# Patient Record
Sex: Female | Born: 1935 | Race: Black or African American | Hispanic: No | Smoking: Never smoker
Health system: Southern US, Community
[De-identification: ages and names within clinical notes are randomized; demographics above are authoritative.]

## PROBLEM LIST (undated history)

## (undated) DIAGNOSIS — M1990 Unspecified osteoarthritis, unspecified site: Secondary | ICD-10-CM

## (undated) DIAGNOSIS — I10 Essential (primary) hypertension: Secondary | ICD-10-CM

## (undated) DIAGNOSIS — C801 Malignant (primary) neoplasm, unspecified: Secondary | ICD-10-CM

## (undated) DIAGNOSIS — IMO0001 Reserved for inherently not codable concepts without codable children: Secondary | ICD-10-CM

## (undated) DIAGNOSIS — E785 Hyperlipidemia, unspecified: Secondary | ICD-10-CM

## (undated) DIAGNOSIS — I639 Cerebral infarction, unspecified: Secondary | ICD-10-CM

## (undated) HISTORY — DX: Essential (primary) hypertension: I10

## (undated) HISTORY — DX: Reserved for inherently not codable concepts without codable children: IMO0001

## (undated) HISTORY — DX: Unspecified osteoarthritis, unspecified site: M19.90

## (undated) HISTORY — DX: Cerebral infarction, unspecified: I63.9

## (undated) HISTORY — DX: Hyperlipidemia, unspecified: E78.5

## (undated) SURGERY — EGD (ESOPHAGOGASTRODUODENOSCOPY)
Anesthesia: Moderate Sedation

---

## 1998-10-03 ENCOUNTER — Encounter: Admission: RE | Admit: 1998-10-03 | Discharge: 1998-10-03 | Payer: Self-pay | Admitting: Family Medicine

## 1998-10-06 ENCOUNTER — Encounter: Admission: RE | Admit: 1998-10-06 | Discharge: 1998-10-06 | Payer: Self-pay | Admitting: Sports Medicine

## 1999-01-21 ENCOUNTER — Ambulatory Visit (HOSPITAL_COMMUNITY): Admission: RE | Admit: 1999-01-21 | Discharge: 1999-01-21 | Payer: Self-pay | Admitting: Gastroenterology

## 1999-06-29 ENCOUNTER — Encounter: Admission: RE | Admit: 1999-06-29 | Discharge: 1999-06-29 | Payer: Self-pay | Admitting: Family Medicine

## 1999-07-14 ENCOUNTER — Ambulatory Visit (HOSPITAL_COMMUNITY): Admission: RE | Admit: 1999-07-14 | Discharge: 1999-07-14 | Payer: Self-pay | Admitting: Gastroenterology

## 2000-07-11 ENCOUNTER — Encounter: Payer: Self-pay | Admitting: Sports Medicine

## 2000-07-11 ENCOUNTER — Encounter: Admission: RE | Admit: 2000-07-11 | Discharge: 2000-07-11 | Payer: Self-pay | Admitting: Sports Medicine

## 2000-08-12 ENCOUNTER — Encounter: Admission: RE | Admit: 2000-08-12 | Discharge: 2000-08-12 | Payer: Self-pay | Admitting: Family Medicine

## 2000-10-06 ENCOUNTER — Ambulatory Visit (HOSPITAL_COMMUNITY): Admission: RE | Admit: 2000-10-06 | Discharge: 2000-10-06 | Payer: Self-pay | Admitting: Gastroenterology

## 2000-10-06 ENCOUNTER — Encounter (INDEPENDENT_AMBULATORY_CARE_PROVIDER_SITE_OTHER): Payer: Self-pay | Admitting: Specialist

## 2001-07-28 ENCOUNTER — Encounter: Admission: RE | Admit: 2001-07-28 | Discharge: 2001-07-28 | Payer: Self-pay | Admitting: *Deleted

## 2001-08-07 ENCOUNTER — Encounter: Admission: RE | Admit: 2001-08-07 | Discharge: 2001-08-07 | Payer: Self-pay | Admitting: Family Medicine

## 2001-08-22 ENCOUNTER — Encounter: Admission: RE | Admit: 2001-08-22 | Discharge: 2001-08-22 | Payer: Self-pay | Admitting: Family Medicine

## 2001-08-22 ENCOUNTER — Ambulatory Visit (HOSPITAL_COMMUNITY): Admission: RE | Admit: 2001-08-22 | Discharge: 2001-08-22 | Payer: Self-pay | Admitting: Sports Medicine

## 2001-08-23 ENCOUNTER — Encounter: Admission: RE | Admit: 2001-08-23 | Discharge: 2001-08-23 | Payer: Self-pay | Admitting: *Deleted

## 2001-08-23 ENCOUNTER — Encounter: Payer: Self-pay | Admitting: *Deleted

## 2001-09-08 ENCOUNTER — Ambulatory Visit (HOSPITAL_COMMUNITY): Admission: RE | Admit: 2001-09-08 | Discharge: 2001-09-08 | Payer: Self-pay | Admitting: *Deleted

## 2001-09-18 ENCOUNTER — Encounter: Admission: RE | Admit: 2001-09-18 | Discharge: 2001-09-18 | Payer: Self-pay | Admitting: Sports Medicine

## 2002-03-21 ENCOUNTER — Encounter (INDEPENDENT_AMBULATORY_CARE_PROVIDER_SITE_OTHER): Payer: Self-pay | Admitting: Specialist

## 2002-03-21 ENCOUNTER — Ambulatory Visit (HOSPITAL_COMMUNITY): Admission: RE | Admit: 2002-03-21 | Discharge: 2002-03-21 | Payer: Self-pay | Admitting: Gastroenterology

## 2002-08-06 ENCOUNTER — Encounter: Payer: Self-pay | Admitting: Sports Medicine

## 2002-08-06 ENCOUNTER — Encounter: Admission: RE | Admit: 2002-08-06 | Discharge: 2002-08-06 | Payer: Self-pay | Admitting: Sports Medicine

## 2002-08-22 ENCOUNTER — Encounter: Admission: RE | Admit: 2002-08-22 | Discharge: 2002-08-22 | Payer: Self-pay | Admitting: Sports Medicine

## 2003-01-31 ENCOUNTER — Encounter: Admission: RE | Admit: 2003-01-31 | Discharge: 2003-01-31 | Payer: Self-pay | Admitting: Family Medicine

## 2003-02-04 ENCOUNTER — Encounter: Admission: RE | Admit: 2003-02-04 | Discharge: 2003-02-04 | Payer: Self-pay | Admitting: Family Medicine

## 2003-02-07 ENCOUNTER — Encounter: Admission: RE | Admit: 2003-02-07 | Discharge: 2003-02-07 | Payer: Self-pay | Admitting: Family Medicine

## 2003-02-13 ENCOUNTER — Encounter: Admission: RE | Admit: 2003-02-13 | Discharge: 2003-02-13 | Payer: Self-pay | Admitting: Family Medicine

## 2003-03-05 ENCOUNTER — Encounter: Admission: RE | Admit: 2003-03-05 | Discharge: 2003-03-05 | Payer: Self-pay | Admitting: Family Medicine

## 2003-04-04 ENCOUNTER — Encounter: Admission: RE | Admit: 2003-04-04 | Discharge: 2003-04-04 | Payer: Self-pay | Admitting: Sports Medicine

## 2003-04-23 ENCOUNTER — Encounter: Admission: RE | Admit: 2003-04-23 | Discharge: 2003-04-23 | Payer: Self-pay | Admitting: Family Medicine

## 2003-09-04 ENCOUNTER — Encounter: Admission: RE | Admit: 2003-09-04 | Discharge: 2003-09-04 | Payer: Self-pay | Admitting: Sports Medicine

## 2003-09-10 ENCOUNTER — Encounter (INDEPENDENT_AMBULATORY_CARE_PROVIDER_SITE_OTHER): Payer: Self-pay | Admitting: *Deleted

## 2003-09-10 ENCOUNTER — Ambulatory Visit (HOSPITAL_COMMUNITY): Admission: RE | Admit: 2003-09-10 | Discharge: 2003-09-10 | Payer: Self-pay | Admitting: Gastroenterology

## 2003-10-02 ENCOUNTER — Encounter (INDEPENDENT_AMBULATORY_CARE_PROVIDER_SITE_OTHER): Payer: Self-pay | Admitting: *Deleted

## 2003-10-02 ENCOUNTER — Encounter: Admission: RE | Admit: 2003-10-02 | Discharge: 2003-10-02 | Payer: Self-pay | Admitting: Sports Medicine

## 2003-10-02 ENCOUNTER — Encounter: Admission: RE | Admit: 2003-10-02 | Discharge: 2003-10-02 | Payer: Self-pay | Admitting: Family Medicine

## 2003-10-02 LAB — CONVERTED CEMR LAB

## 2003-10-08 ENCOUNTER — Encounter: Admission: RE | Admit: 2003-10-08 | Discharge: 2003-10-08 | Payer: Self-pay | Admitting: Family Medicine

## 2003-10-21 ENCOUNTER — Encounter: Admission: RE | Admit: 2003-10-21 | Discharge: 2003-10-21 | Payer: Self-pay | Admitting: Family Medicine

## 2004-03-13 ENCOUNTER — Encounter: Admission: RE | Admit: 2004-03-13 | Discharge: 2004-03-13 | Payer: Self-pay | Admitting: Family Medicine

## 2004-04-13 ENCOUNTER — Encounter: Admission: RE | Admit: 2004-04-13 | Discharge: 2004-04-13 | Payer: Self-pay | Admitting: Family Medicine

## 2004-04-14 ENCOUNTER — Encounter: Admission: RE | Admit: 2004-04-14 | Discharge: 2004-04-14 | Payer: Self-pay | Admitting: Family Medicine

## 2004-10-06 ENCOUNTER — Encounter: Admission: RE | Admit: 2004-10-06 | Discharge: 2004-10-06 | Payer: Self-pay | Admitting: Sports Medicine

## 2005-04-22 ENCOUNTER — Ambulatory Visit: Payer: Self-pay | Admitting: Family Medicine

## 2005-05-10 ENCOUNTER — Emergency Department (HOSPITAL_COMMUNITY): Admission: EM | Admit: 2005-05-10 | Discharge: 2005-05-10 | Payer: Self-pay | Admitting: Family Medicine

## 2005-05-12 ENCOUNTER — Ambulatory Visit (HOSPITAL_COMMUNITY): Admission: RE | Admit: 2005-05-12 | Discharge: 2005-05-12 | Payer: Self-pay | Admitting: Orthopaedic Surgery

## 2005-06-01 ENCOUNTER — Encounter: Admission: RE | Admit: 2005-06-01 | Discharge: 2005-08-30 | Payer: Self-pay | Admitting: Orthopaedic Surgery

## 2005-09-07 ENCOUNTER — Ambulatory Visit: Payer: Self-pay | Admitting: Family Medicine

## 2005-10-14 ENCOUNTER — Encounter: Admission: RE | Admit: 2005-10-14 | Discharge: 2005-10-14 | Payer: Self-pay | Admitting: Sports Medicine

## 2005-12-31 ENCOUNTER — Ambulatory Visit: Payer: Self-pay | Admitting: Sports Medicine

## 2006-10-17 ENCOUNTER — Encounter: Admission: RE | Admit: 2006-10-17 | Discharge: 2006-10-17 | Payer: Self-pay | Admitting: Sports Medicine

## 2006-12-12 ENCOUNTER — Emergency Department (HOSPITAL_COMMUNITY): Admission: EM | Admit: 2006-12-12 | Discharge: 2006-12-12 | Payer: Self-pay | Admitting: Family Medicine

## 2006-12-29 DIAGNOSIS — E7800 Pure hypercholesterolemia, unspecified: Secondary | ICD-10-CM | POA: Insufficient documentation

## 2006-12-29 DIAGNOSIS — D126 Benign neoplasm of colon, unspecified: Secondary | ICD-10-CM | POA: Insufficient documentation

## 2006-12-29 DIAGNOSIS — E669 Obesity, unspecified: Secondary | ICD-10-CM | POA: Insufficient documentation

## 2006-12-29 DIAGNOSIS — I6789 Other cerebrovascular disease: Secondary | ICD-10-CM | POA: Insufficient documentation

## 2006-12-29 DIAGNOSIS — I10 Essential (primary) hypertension: Secondary | ICD-10-CM | POA: Insufficient documentation

## 2006-12-30 ENCOUNTER — Encounter (INDEPENDENT_AMBULATORY_CARE_PROVIDER_SITE_OTHER): Payer: Self-pay | Admitting: *Deleted

## 2007-03-09 ENCOUNTER — Ambulatory Visit: Payer: Self-pay | Admitting: Family Medicine

## 2007-03-09 ENCOUNTER — Encounter: Payer: Self-pay | Admitting: Family Medicine

## 2007-03-09 DIAGNOSIS — M479 Spondylosis, unspecified: Secondary | ICD-10-CM | POA: Insufficient documentation

## 2007-03-10 LAB — CONVERTED CEMR LAB
ALT: 13 units/L (ref 0–35)
AST: 16 units/L (ref 0–37)
Albumin: 3.8 g/dL (ref 3.5–5.2)
Alkaline Phosphatase: 88 units/L (ref 39–117)
BUN: 15 mg/dL (ref 6–23)
CO2: 28 meq/L (ref 19–32)
Calcium: 9.2 mg/dL (ref 8.4–10.5)
Chloride: 103 meq/L (ref 96–112)
Cholesterol: 259 mg/dL — ABNORMAL HIGH (ref 0–200)
Creatinine, Ser: 0.75 mg/dL (ref 0.40–1.20)
Glucose, Bld: 96 mg/dL (ref 70–99)
HDL: 48 mg/dL (ref >39)
LDL Cholesterol: 195 mg/dL — ABNORMAL HIGH (ref 0–99)
Potassium: 3.9 meq/L (ref 3.5–5.3)
Sodium: 141 meq/L (ref 135–145)
Total Bilirubin: 0.4 mg/dL (ref 0.3–1.2)
Total CHOL/HDL Ratio: 5.4
Total Protein: 7.1 g/dL (ref 6.0–8.3)
Triglycerides: 79 mg/dL (ref <150)
VLDL: 16 mg/dL (ref 0–40)

## 2007-03-30 ENCOUNTER — Encounter: Payer: Self-pay | Admitting: Family Medicine

## 2007-04-26 ENCOUNTER — Telehealth: Payer: Self-pay | Admitting: *Deleted

## 2007-08-08 ENCOUNTER — Telehealth: Payer: Self-pay | Admitting: *Deleted

## 2007-10-20 ENCOUNTER — Encounter: Admission: RE | Admit: 2007-10-20 | Discharge: 2007-10-20 | Payer: Self-pay | Admitting: Sports Medicine

## 2008-01-05 ENCOUNTER — Ambulatory Visit: Payer: Self-pay | Admitting: Family Medicine

## 2008-01-05 DIAGNOSIS — M19049 Primary osteoarthritis, unspecified hand: Secondary | ICD-10-CM | POA: Insufficient documentation

## 2008-01-19 ENCOUNTER — Emergency Department (HOSPITAL_COMMUNITY): Admission: EM | Admit: 2008-01-19 | Discharge: 2008-01-19 | Payer: Self-pay | Admitting: Emergency Medicine

## 2008-02-23 ENCOUNTER — Encounter (INDEPENDENT_AMBULATORY_CARE_PROVIDER_SITE_OTHER): Payer: Self-pay | Admitting: Family Medicine

## 2008-02-23 ENCOUNTER — Ambulatory Visit: Payer: Self-pay | Admitting: Family Medicine

## 2008-02-23 LAB — CONVERTED CEMR LAB
Bilirubin Urine: NEGATIVE
Blood in Urine, dipstick: NEGATIVE
Glucose, Urine, Semiquant: NEGATIVE
Ketones, urine, test strip: NEGATIVE
Nitrite: NEGATIVE
Specific Gravity, Urine: 1.02
Urobilinogen, UA: 0.2
pH: 6

## 2008-03-28 ENCOUNTER — Telehealth: Payer: Self-pay | Admitting: *Deleted

## 2008-04-23 ENCOUNTER — Encounter: Payer: Self-pay | Admitting: Family Medicine

## 2008-04-23 ENCOUNTER — Ambulatory Visit: Payer: Self-pay | Admitting: Family Medicine

## 2008-04-23 DIAGNOSIS — M1990 Unspecified osteoarthritis, unspecified site: Secondary | ICD-10-CM | POA: Insufficient documentation

## 2008-04-23 LAB — CONVERTED CEMR LAB
ALT: 10 units/L (ref 0–35)
AST: 14 units/L (ref 0–37)
Albumin: 4 g/dL (ref 3.5–5.2)
Alkaline Phosphatase: 87 units/L (ref 39–117)
BUN: 16 mg/dL (ref 6–23)
CO2: 27 meq/L (ref 19–32)
Calcium: 9.3 mg/dL (ref 8.4–10.5)
Chloride: 104 meq/L (ref 96–112)
Cholesterol: 260 mg/dL — ABNORMAL HIGH (ref 0–200)
Creatinine, Ser: 0.72 mg/dL (ref 0.40–1.20)
Glucose, Bld: 104 mg/dL — ABNORMAL HIGH (ref 70–99)
HDL: 43 mg/dL (ref >39)
LDL Cholesterol: 198 mg/dL — ABNORMAL HIGH (ref 0–99)
Potassium: 3.6 meq/L (ref 3.5–5.3)
Sodium: 140 meq/L (ref 135–145)
Total Bilirubin: 0.7 mg/dL (ref 0.3–1.2)
Total CHOL/HDL Ratio: 6
Total Protein: 7 g/dL (ref 6.0–8.3)
Triglycerides: 95 mg/dL (ref <150)
VLDL: 19 mg/dL (ref 0–40)

## 2008-05-14 ENCOUNTER — Emergency Department (HOSPITAL_COMMUNITY): Admission: EM | Admit: 2008-05-14 | Discharge: 2008-05-14 | Payer: Self-pay | Admitting: Emergency Medicine

## 2008-05-15 ENCOUNTER — Ambulatory Visit: Payer: Self-pay | Admitting: Family Medicine

## 2008-05-28 ENCOUNTER — Ambulatory Visit: Payer: Self-pay | Admitting: Family Medicine

## 2008-05-31 ENCOUNTER — Encounter: Payer: Self-pay | Admitting: Family Medicine

## 2008-06-03 ENCOUNTER — Encounter: Admission: RE | Admit: 2008-06-03 | Discharge: 2008-06-03 | Payer: Self-pay | Admitting: Sports Medicine

## 2008-08-02 ENCOUNTER — Telehealth: Payer: Self-pay | Admitting: Family Medicine

## 2008-08-14 ENCOUNTER — Encounter: Payer: Self-pay | Admitting: Family Medicine

## 2008-08-14 ENCOUNTER — Ambulatory Visit: Payer: Self-pay | Admitting: Family Medicine

## 2008-10-21 ENCOUNTER — Ambulatory Visit: Payer: Self-pay | Admitting: Family Medicine

## 2008-10-21 ENCOUNTER — Encounter: Admission: RE | Admit: 2008-10-21 | Discharge: 2008-10-21 | Payer: Self-pay | Admitting: Family Medicine

## 2008-10-28 ENCOUNTER — Telehealth: Payer: Self-pay | Admitting: Family Medicine

## 2008-10-29 ENCOUNTER — Encounter: Payer: Self-pay | Admitting: Family Medicine

## 2009-02-03 ENCOUNTER — Encounter: Payer: Self-pay | Admitting: Family Medicine

## 2009-02-12 ENCOUNTER — Encounter: Payer: Self-pay | Admitting: Family Medicine

## 2009-02-25 ENCOUNTER — Encounter: Payer: Self-pay | Admitting: Family Medicine

## 2009-06-06 ENCOUNTER — Encounter: Payer: Self-pay | Admitting: Family Medicine

## 2009-06-12 ENCOUNTER — Encounter: Payer: Self-pay | Admitting: Family Medicine

## 2009-06-17 ENCOUNTER — Encounter: Payer: Self-pay | Admitting: Family Medicine

## 2009-06-23 ENCOUNTER — Ambulatory Visit: Payer: Self-pay | Admitting: Family Medicine

## 2009-06-23 ENCOUNTER — Ambulatory Visit (HOSPITAL_COMMUNITY): Admission: RE | Admit: 2009-06-23 | Discharge: 2009-06-23 | Payer: Self-pay | Admitting: Family Medicine

## 2009-06-23 ENCOUNTER — Encounter: Payer: Self-pay | Admitting: Family Medicine

## 2009-06-23 DIAGNOSIS — Z96649 Presence of unspecified artificial hip joint: Secondary | ICD-10-CM | POA: Insufficient documentation

## 2009-06-23 LAB — CONVERTED CEMR LAB
ALT: 16 units/L (ref 0–35)
AST: 15 units/L (ref 0–37)
Albumin: 4 g/dL (ref 3.5–5.2)
Alkaline Phosphatase: 74 units/L (ref 39–117)
BUN: 26 mg/dL — ABNORMAL HIGH (ref 6–23)
CO2: 25 meq/L (ref 19–32)
Calcium: 9.3 mg/dL (ref 8.4–10.5)
Chloride: 104 meq/L (ref 96–112)
Cholesterol: 292 mg/dL — ABNORMAL HIGH (ref 0–200)
Creatinine, Ser: 0.79 mg/dL (ref 0.40–1.20)
Glucose, Bld: 107 mg/dL — ABNORMAL HIGH (ref 70–99)
HCT: 36.3 % (ref 36.0–46.0)
HDL: 56 mg/dL (ref >39)
Hemoglobin: 11.6 g/dL — ABNORMAL LOW (ref 12.0–15.0)
LDL Cholesterol: 223 mg/dL — ABNORMAL HIGH (ref 0–99)
MCHC: 32 g/dL (ref 30.0–36.0)
MCV: 74.1 fL — ABNORMAL LOW (ref 78.0–100.0)
Platelets: 218 10*3/uL (ref 150–400)
Potassium: 3.5 meq/L (ref 3.5–5.3)
RBC: 4.9 M/uL (ref 3.87–5.11)
RDW: 15.7 % — ABNORMAL HIGH (ref 11.5–15.5)
Sodium: 142 meq/L (ref 135–145)
Total Bilirubin: 0.4 mg/dL (ref 0.3–1.2)
Total CHOL/HDL Ratio: 5.2
Total Protein: 7.2 g/dL (ref 6.0–8.3)
Triglycerides: 66 mg/dL (ref <150)
VLDL: 13 mg/dL (ref 0–40)
WBC: 6.2 10*3/uL (ref 4.0–10.5)

## 2009-06-26 ENCOUNTER — Encounter: Payer: Self-pay | Admitting: Family Medicine

## 2009-06-30 ENCOUNTER — Telehealth (INDEPENDENT_AMBULATORY_CARE_PROVIDER_SITE_OTHER): Payer: Self-pay | Admitting: *Deleted

## 2009-07-04 ENCOUNTER — Telehealth (INDEPENDENT_AMBULATORY_CARE_PROVIDER_SITE_OTHER): Payer: Self-pay | Admitting: *Deleted

## 2009-07-08 ENCOUNTER — Encounter: Payer: Self-pay | Admitting: *Deleted

## 2009-07-16 ENCOUNTER — Encounter: Payer: Self-pay | Admitting: Family Medicine

## 2009-07-16 ENCOUNTER — Ambulatory Visit: Payer: Self-pay | Admitting: Family Medicine

## 2009-07-25 ENCOUNTER — Encounter: Payer: Self-pay | Admitting: Family Medicine

## 2009-07-28 ENCOUNTER — Telehealth (INDEPENDENT_AMBULATORY_CARE_PROVIDER_SITE_OTHER): Payer: Self-pay | Admitting: *Deleted

## 2009-08-01 HISTORY — PX: TOTAL HIP ARTHROPLASTY: SHX124

## 2009-08-04 ENCOUNTER — Inpatient Hospital Stay (HOSPITAL_COMMUNITY): Admission: RE | Admit: 2009-08-04 | Discharge: 2009-08-08 | Payer: Self-pay | Admitting: Orthopedic Surgery

## 2009-08-14 ENCOUNTER — Encounter (HOSPITAL_COMMUNITY): Admission: RE | Admit: 2009-08-14 | Discharge: 2009-10-06 | Payer: Self-pay | Admitting: Endocrinology

## 2009-09-04 ENCOUNTER — Ambulatory Visit: Payer: Self-pay | Admitting: Family Medicine

## 2009-09-04 ENCOUNTER — Encounter: Payer: Self-pay | Admitting: Family Medicine

## 2009-09-04 DIAGNOSIS — D62 Acute posthemorrhagic anemia: Secondary | ICD-10-CM | POA: Insufficient documentation

## 2009-09-04 LAB — CONVERTED CEMR LAB: INR: 3.4

## 2009-09-05 ENCOUNTER — Encounter: Payer: Self-pay | Admitting: Family Medicine

## 2009-09-05 LAB — CONVERTED CEMR LAB
BUN: 10 mg/dL (ref 6–23)
CO2: 22 meq/L (ref 19–32)
Calcium: 9.1 mg/dL (ref 8.4–10.5)
Chloride: 103 meq/L (ref 96–112)
Creatinine, Ser: 0.71 mg/dL (ref 0.40–1.20)
Glucose, Bld: 95 mg/dL (ref 70–99)
HCT: 35 % — ABNORMAL LOW (ref 36.0–46.0)
Hemoglobin: 10.8 g/dL — ABNORMAL LOW (ref 12.0–15.0)
MCHC: 30.9 g/dL (ref 30.0–36.0)
MCV: 77.6 fL — ABNORMAL LOW (ref 78.0–100.0)
Platelets: 249 10*3/uL (ref 150–400)
Potassium: 3.3 meq/L — ABNORMAL LOW (ref 3.5–5.3)
RBC: 4.51 M/uL (ref 3.87–5.11)
RDW: 16.7 % — ABNORMAL HIGH (ref 11.5–15.5)
Sodium: 141 meq/L (ref 135–145)
WBC: 12.7 10*3/uL — ABNORMAL HIGH (ref 4.0–10.5)

## 2009-09-10 ENCOUNTER — Encounter: Payer: Self-pay | Admitting: Family Medicine

## 2009-09-19 ENCOUNTER — Encounter: Admission: RE | Admit: 2009-09-19 | Discharge: 2009-10-30 | Payer: Self-pay | Admitting: Orthopedic Surgery

## 2009-10-02 ENCOUNTER — Encounter: Payer: Self-pay | Admitting: Family Medicine

## 2009-10-22 ENCOUNTER — Encounter: Admission: RE | Admit: 2009-10-22 | Discharge: 2009-10-22 | Payer: Self-pay | Admitting: Family Medicine

## 2010-01-07 ENCOUNTER — Telehealth: Payer: Self-pay | Admitting: Family Medicine

## 2010-02-09 ENCOUNTER — Ambulatory Visit: Payer: Self-pay | Admitting: Family Medicine

## 2010-02-09 LAB — CONVERTED CEMR LAB
Cholesterol, target level: 200 mg/dL
HDL goal, serum: 40 mg/dL
LDL Goal: 130 mg/dL

## 2010-02-13 ENCOUNTER — Ambulatory Visit: Payer: Self-pay | Admitting: Family Medicine

## 2010-02-24 ENCOUNTER — Encounter: Payer: Self-pay | Admitting: Family Medicine

## 2010-06-02 ENCOUNTER — Ambulatory Visit: Payer: Self-pay | Admitting: Family Medicine

## 2010-06-02 ENCOUNTER — Encounter: Admission: RE | Admit: 2010-06-02 | Discharge: 2010-06-02 | Payer: Self-pay | Admitting: Family Medicine

## 2010-08-21 ENCOUNTER — Ambulatory Visit: Payer: Self-pay | Admitting: Family Medicine

## 2010-08-25 ENCOUNTER — Ambulatory Visit: Payer: Self-pay | Admitting: Family Medicine

## 2010-08-25 ENCOUNTER — Encounter: Payer: Self-pay | Admitting: Family Medicine

## 2010-08-31 LAB — CONVERTED CEMR LAB
ALT: 12 units/L (ref 0–35)
AST: 15 units/L (ref 0–37)
Albumin: 3.9 g/dL (ref 3.5–5.2)
Alkaline Phosphatase: 81 units/L (ref 39–117)
BUN: 20 mg/dL (ref 6–23)
CO2: 26 meq/L (ref 19–32)
Calcium: 9.5 mg/dL (ref 8.4–10.5)
Chloride: 103 meq/L (ref 96–112)
Cholesterol: 281 mg/dL — ABNORMAL HIGH (ref 0–200)
Creatinine, Ser: 0.72 mg/dL (ref 0.40–1.20)
Glucose, Bld: 125 mg/dL — ABNORMAL HIGH (ref 70–99)
HCT: 37.4 % (ref 36.0–46.0)
HDL: 51 mg/dL (ref >39)
Hemoglobin: 11.7 g/dL — ABNORMAL LOW (ref 12.0–15.0)
LDL Cholesterol: 210 mg/dL — ABNORMAL HIGH (ref 0–99)
MCHC: 31.3 g/dL (ref 30.0–36.0)
MCV: 74.5 fL — ABNORMAL LOW (ref 78.0–100.0)
Platelets: 210 10*3/uL (ref 150–400)
Potassium: 3.7 meq/L (ref 3.5–5.3)
RBC: 5.02 M/uL (ref 3.87–5.11)
RDW: 15.1 % (ref 11.5–15.5)
Sodium: 141 meq/L (ref 135–145)
Total Bilirubin: 0.5 mg/dL (ref 0.3–1.2)
Total CHOL/HDL Ratio: 5.5
Total Protein: 6.9 g/dL (ref 6.0–8.3)
Triglycerides: 99 mg/dL (ref <150)
VLDL: 20 mg/dL (ref 0–40)
WBC: 5.3 10*3/uL (ref 4.0–10.5)

## 2010-10-27 ENCOUNTER — Encounter
Admission: RE | Admit: 2010-10-27 | Discharge: 2010-10-27 | Payer: Self-pay | Source: Home / Self Care | Attending: Family Medicine | Admitting: Family Medicine

## 2010-11-21 ENCOUNTER — Encounter: Payer: Self-pay | Admitting: Sports Medicine

## 2010-12-01 NOTE — Progress Notes (Signed)
Summary: phn msg  Phone Note Call from Patient   Caller: Patient Summary of Call: needs medical release form sent to dentist releasing her for her hip replacement surgery. Initial call taken by: Clydell Hakim,  July 28, 2009 9:55 AM  Follow-up for Phone Call        She needs to sign a medical release form at her dentist's office to be faxed to her surgeon.  Notified pt and told her to call us back if she needs anything from [place]. Follow-up by: Modesta Messing LPN,  July 28, 2009 12:10 PM

## 2010-12-01 NOTE — Progress Notes (Signed)
Summary: [facility]  Phone Note Call from Patient Call back at Home Phone [phone]   Summary of Call: [facility] faxed paperwork over on 12/18 and pt would like to know status, states it was faxed with Dr. Darrick Penna' name on it and she thinks it may have gotten thrown out since he is no longer at [facility]. Initial call taken by: Haydee Salter,  October 28, 2008 10:54 AM  Follow-up for Phone Call        Have you seen this? Follow-up by: [facility] CMA,  October 28, 2008 12:23 PM    I have not seen this in my box.  Please call pt and ask her to have [facility] refax the paperwork with Attn to Cat Ta.  Thanks.  CAT TA MD  October 28, 2008 1:40 PM   Appended Document: [facility] LMOVM informing of the above

## 2010-12-01 NOTE — Assessment & Plan Note (Signed)
Summary: cpe wk   Vital Signs:  Patient Profile:   77 Years Old Female Height:     63.25 inches Weight:      175.7 pounds Temp:     97.9 degrees F Pulse rate:   81 / minute BP sitting:   158 / 83  (left arm)  Pt. in pain?   no  Vitals Entered By: Jacki Cones RN (Mar 09, 2007 2:34 PM)                Chief Complaint:  CPE.  History of Present Illness: Pt is an 77 yo woman who returns today for her yearly physical exam.  She has no complaints other than her occasional arthritis flares.  Due to these flares she desires a handicapped sticker for her car.  She also desires a replacement soft cervical collar so that she can continue to wear it when she goes fishing.  She had her yearly mammogram in 12/07 and follows closely w/ Dr. Matthias Hughs due to multiple colon polyps and a family hx of colon cancer.  She no longer needs pap smears b/c she had a hysterectomy in 1968.  She refuses medication for cholesterol and is not willing to make additional dietary changes.  She continues to take Tenoretic for her HTN and states she has a hx of white coat HTN but it is otherwise well controlled.    Past Surgical History:    Cataract surgery o.u.    colonic polyps 7/99, 12/01, 09/2003    colonoscopy-polypectomy - 10/19/2000    hysterectomy and BSO `68    polypectomy 3/00--adenomatous, no hi gr. Dys    repeat polypectomy 9/00--adenomatous   Family History:    Bro died 75yo colon CA    bro died 75yo heart dz    Dad died at 75yo of heart dz, Etoh, tob.    Mom died at 64yo of CAD    Sister alive at 69yo with HTN, DM    Gr`m with DM    Two sisters died 72,54 yo of esoph, stomach Ca   Risk Factors:  Tobacco use:  never   Review of Systems      See HPI   Physical Exam  General:     Well-developed,well-nourished,in no acute distress; alert,appropriate and cooperative throughout examination Head:     Normocephalic and atraumatic without obvious abnormalities. No apparent alopecia or  balding. Eyes:     No corneal or conjunctival inflammation noted. EOMI. Perrla. Funduscopic exam benign, without hemorrhages, exudates or papilledema. Vision grossly normal. Ears:     External ear exam shows no significant lesions or deformities.  Otoscopic examination reveals clear canals, tympanic membranes are intact bilaterally without bulging, retraction, inflammation or discharge. Hearing is grossly normal bilaterally. Nose:     External nasal examination shows no deformity or inflammation. Nasal mucosa are pink and moist without lesions or exudates, some edema of nasal turbinates. Mouth:     Oral mucosa and oropharynx without lesions or exudates.  Teeth in good repair. Neck:     No deformities, masses, or tenderness noted. Breasts:     No mass, nodules, thickening, tenderness, bulging, retraction, inflamation, nipple discharge or skin changes noted.   Lungs:     Normal respiratory effort, chest expands symmetrically. Lungs are clear to auscultation, no crackles or wheezes. Heart:     Normal rate and regular rhythm. S1 and S2 normal without gallop, murmur, click, rub or other extra sounds. Abdomen:     obese,  soft, NT/ND, +BS Genitalia:     DEFERRED Pulses:     +2 DP and radial pulses Extremities:     No clubbing, cyanosis, edema Neurologic:     No cranial nerve deficits noted. Station and gait are normal. Sensory, motor and coordinative functions appear intact. Skin:     Intact without suspicious lesions or rashes    Impression & Recommendations:  Problem # 1:  HYPERTENSION, BENIGN SYSTEMIC (ICD-401.1) Assessment: Unchanged Pt in need of Tenoretic refill-->provided.  Pt w/ reported white coat HTN but unclear as to what 'fine' BP readings are on other occasions.  At this time will check a CMP as pt is on diuretic.  Asked pt to follow up in 6 months rather than her yearly CPE exam.  Pt in agreement w/ this plan. Orders: Comp Met-[facility] [id number]) Lipid-[facility]  ([id number])   Problem # 2:  HYPERCHOLESTEROLEMIA (ICD-272.0) Assessment: Unchanged Pt w/ hx of hyperlipidemia w/ LDL well above goal.  However pt indicates she is not willing to take any medication nor change her diet at this point.  She does however want to know what her value is.  Will check FLP today and revisit this issue in the future.  However at 77 yrs old she has the right to refuse treatment and I will not push the issue too hard. Orders: Lipid-[facility] ([id number])   Problem # 3:  EXAMINATION, ROUTINE MEDICAL (ICD-V70.0) Assessment: Comment Only Pt up to date on all routine medical screens.  Appears much younger than stated age of 77.  Provided soft cervical collar for cervical spine arthritis. Orders: [facility] - Est  65+ [phone])   Other Orders: Cervical collar- [facility] ([id number])   Pt's BP 159/83 upon recheck. ..................................................................Marland KitchenAMY MARTIN RN  Mar 09, 2007 3:17 PM

## 2010-12-01 NOTE — Letter (Signed)
Summary: Generic Letter  [facility]  [address]   [place], [place] [address]   Phone: [phone]  Fax: [phone]    06/17/2009  [name] [address] [place], [place]  [address]  Dear Ms. Linda Macias,  I have tried to call you but was not able to reach you by phone.  I received a Surgical Clearance request from [facility] for you for rigth total hip replacement surgery.  In order for me to give clearance for this surgery I will need to see you in the clinic to evaluate your medical conditions and risks for this procedure.  Please call the [facility] at [phone] to schedule an appointment with me for surgical clearance.  Thank you for your time.   Sincerely,   Cat Ta MD  Appended Document: Generic Letter Letter sent.

## 2010-12-01 NOTE — Progress Notes (Signed)
Summary: phn msg  Phone Note Call from Patient Call back at Home Phone [phone]   Caller: Patient Summary of Call: pt says info needed is [address] [facility]  [phone]. Initial call taken by: Clydell Hakim,  July 04, 2009 11:47 AM  Follow-up for Phone Call        Attempted to call insurance co. because info from phone message is incorrect.  Was routed through a phone tree for 30 minutes and it came back to the beginning and started over again.  Unable to speak to anyone at [facility].  Pt suggested that she is thinking about changing her insurance company.  Referral has been faxed twice and pt contacted her insurance co and was given the wrong information.  Will forward to MD.   Follow-up by: Modesta Messing LPN,  July 04, 2009 12:13 PM     Appended Document: phn msg I called [employer] and authorization number is [phone] and is good until 08/18/09.  Okey Regal

## 2010-12-01 NOTE — Progress Notes (Signed)
Summary: Handicap sticker  Phone Note Call from Patient Call back at Home Phone [phone]   Summary of Call: Is requesting to speak with someone about handicap sticker. Initial call taken by: Haydee Salter,  Mar 28, 2008 9:15 AM  Follow-up for Phone Call        spoke with patient and she needs to have a Handicap sticker filled out for fishing. states it normally cost $25.00 a year to fish in the 3 lakes around the area but if a handicap person has this form signed she can fish for free. advised to bring form by and will give to MD to sign. . Follow-up by: Theresia Lo RN,  Mar 28, 2008 9:23 AM  Additional Follow-up for Phone Call Additional follow up Details #1::        Pt needs to make appt to see me as she has high blood pressure and has not been seen for this in over 1 year Additional Follow-up by: Neena Rhymes  MD,  Mar 28, 2008 10:52 AM    Additional Follow-up for Phone Call Additional follow up Details #2::    patient notified and appointment scheduled. she will bring in  handicap form for fishing to that appointment. Follow-up by: Theresia Lo RN,  Mar 28, 2008 3:56 PM

## 2010-12-01 NOTE — Consult Note (Signed)
Summary: [facility]  [facility]   Imported By: Clydell Hakim 06/18/2009 15:54:39  _____________________________________________________________________  External Attachment:    Type:   Image     Comment:   External Document

## 2010-12-01 NOTE — Consult Note (Signed)
Summary: [facility]  [facility]   Imported By: Clydell Hakim 06/09/2009 15:16:27  _____________________________________________________________________  External Attachment:    Type:   Image     Comment:   External Document

## 2010-12-01 NOTE — Assessment & Plan Note (Signed)
Summary: thumb pain wp   Vital Signs:  Patient Profile:   77 Years Old Female Height:     63.25 inches Weight:      175.8 pounds Temp:     98.3 degrees F Pulse rate:   64 / minute BP sitting:   156 / 79  (left arm)  Pt. in pain?   yes    Location:   right hand    Intensity:   5    Type:       sore  Vitals Entered ByJacki Cones RN (January 05, 2008 9:28 AM)                  Chief Complaint:  pain and swelling in right hand x3 weeks.  History of Present Illness: Pain and swelling at right 1st MCP joint x 3 weeks, with symptoms now improving. She denies any fevers, chills.  she has no hx of gout, RA. she did have similar sx a few yrs ago and was diagnosed with carpal tunnel syndrome. She denies any numbness, tingling, weakness.  She has not tried any medication for this. She has ibuprofen at home but has not taken it b/c in the past it has altered her mental status.  Denies specific injury Denies pain in other joints.    Prior Medications Reviewed Using: Patient Recall  Prior Medication List:  ATENOLOL-CHLORTHALIDONE 50-25 MG TABS (ATENOLOL-CHLORTHALIDONE) Take 1 tablet by mouth once a day IBUPROFEN 400 MG  TABS (IBUPROFEN) 1 tab by mouth q 6 hours as needed for pain   Current Allergies: No known allergies       Physical Exam  General:     Well-developed,well-nourished,in no acute distress; alert,appropriate and cooperative throughout examination Msk:     wrists with full ROM, painless. pain at Right 1st mcp with all motion, but 5/5 strength and full rom.  No warmth/swelling noted. Negative finkelstein, tinel, phalen test.  normal interosseous muscle strength.    Impression & Recommendations:  Problem # 1:  OSTEOARTHROSIS UNSPEC WHETHER GEN/LOCALIZED HAND (ZOX-096.04) Assessment: New Likely osteoarthritis of 1st mcp. No evidence of carpal tunnel syndrome, dequervain's.  No hx of gout. Symptoms are already improving. Will treat with voltaren gel as she  has been intolerant of ibuprofen in the past.  Her updated medication list for this problem includes:    Ibuprofen 400 Mg Tabs (Ibuprofen) .Marland Kitchen... 1 tab by mouth q 6 hours as needed for pain  Orders: [facility]- Est Level  3 (54098)   Complete Medication List: 1)  Atenolol-chlorthalidone 50-25 Mg Tabs (Atenolol-chlorthalidone) .... Take 1 tablet by mouth once a day 2)  Ibuprofen 400 Mg Tabs (Ibuprofen) .Marland Kitchen.. 1 tab by mouth q 6 hours as needed for pain 3)  Voltaren 1 % Gel (Diclofenac sodium) .... Apply 2 grams 4x daily  dispense 100 gram tube   Patient Instructions: 1)  Apply voltaren gel to affected area 4x daily    Prescriptions: VOLTAREN 1 %  GEL (DICLOFENAC SODIUM) apply 2 grams 4x daily  dispense 100 gram tube  #1 x 1   Entered and Authorized by:   Benn Moulder MD   Signed by:   Benn Moulder MD on 01/05/2008   Method used:   [facility] ...       [facility]  [address] [phone]*       [address]       [place], [place]  [address]  Ph: [phone] or [phone]       Fax: [phone]   RxID:   [email or web address]  ]

## 2010-12-01 NOTE — Progress Notes (Signed)
Summary: Rx  Phone Note Call from Patient   Summary of Call: Pt is requesting a refill on Ibuprofen 400mg  for her arthritis - [facility] [address] Initial call taken by: Haydee Salter,  August 08, 2007 10:40 AM  Follow-up for Phone Call        script written.  please notify pt it will be available for pick up at pharmacy Follow-up by: Neena Rhymes  MD,  August 09, 2007 8:25 AM  Additional Follow-up for Phone Call Additional follow up Details #1::        CALLED PT. RX WAS SENT Additional Follow-up by: Arlyss Repress CMA,,  August 09, 2007 9:57 AM    New/Updated Medications: IBUPROFEN 400 MG  TABS (IBUPROFEN) 1 tab by mouth q 6 hours as needed for pain   Prescriptions: IBUPROFEN 400 MG  TABS (IBUPROFEN) 1 tab by mouth q 6 hours as needed for pain  #100 x 1   Entered and Authorized by:   Neena Rhymes  MD   Signed by:   Neena Rhymes  MD on 08/09/2007   Method used:   Electronically sent to ...       [facility]  [address] [phone]*       [address]       [place], [place]  [address]       Ph: [phone] or [phone]       Fax: [phone]   RxID:   [id number]

## 2010-12-01 NOTE — Progress Notes (Signed)
Summary: Rx Req  Phone Note Call from Patient Call back at Home Phone [phone]   Caller: Patient Summary of Call: Pt would like a rx for Methocarbamol #500 mg took these while she was in rehab for her hip replacement.  [facility] [address] Initial call taken by: Clydell Hakim,  January 07, 2010 10:35 AM  Follow-up for Phone Call        will forward to MD. Follow-up by: Theresia Lo RN,  January 07, 2010 12:21 PM  Additional Follow-up for Phone Call Additional follow up Details #1::        attempted caling patient , no answer. Additional Follow-up by: Theresia Lo RN,  January 07, 2010 5:32 PM    I do not think that this medication is appropriate for patient given her age.  If she is in pain, I would like to see her.  Thank you.  Katielynn Horan MD  January 07, 2010 12:29 PM  Appended Document: Rx Req spoke with  patient and message given.

## 2010-12-01 NOTE — Assessment & Plan Note (Signed)
Summary: fu bp wp   Vital Signs:  Patient Profile:   77 Years Old Female Height:     63.25 inches Weight:      168.6 pounds BMI:     29.74 Temp:     98.0 degrees F oral Pulse rate:   66 / minute BP sitting:   168 / 78  (left arm) Cuff size:   regular  Pt. in pain?   no  Vitals Entered By: Dedra Skeens CMA, (October 21, 2008 10:33 AM)                 Last Flex Sig:  Done. (09/02/2003 12:00:00 AM) Flex Sig Next Due:  Not Indicated Last Hemoccult Result: Done. (09/02/2003 12:00:00 AM) Hemoccult Next Due:  Not Indicated   PCP:  CAT TA MD  Chief Complaint:  f/u bp.  History of Present Illness: 77 y/o F with HTN, HLD, DJD, Overweight.  Pt is here for HTN f/u and discussion regarding HLD.  HTN:  Pt taking Atenolol/Chlortalidone 50/25 without problems.  Pt did not take med this morning because she thought she may need labs at this appt.  States that she checks her bp at [facility] and SBP range 140s/80s.  No ches pain, Ha, dyspnes, edema.  Pt does not use salt in her diet since th 1970s.  HLD:  In 04/23/08 LDL 198, HDL 43.  Pt is resistant to taking cholesterol med.  States that she took Lipitor and Crestor in the past and had side effects.  Cannot remember what the SE were.    Overweight:  BMI 29.  Pt has gained 2 lbs since last visit.  States that she has been eating more because of the holidays.  She is very active but has not have a regular exercise regimen recently.  DJD:  States that she has arthitis that is relieved by Ibuprofen.  Would like refills.      Current Allergies: No known allergies    Social History:    Lives alone in [place]. Divorced & widowed. No tob, etoh, drugs.  +exercise (30 min/d). Good diet.  Works as Manufacturing engineer.  Daughter is Charity fundraiser at [employer].  2 grandchildren, great-grandchildren.; ENJOYS FISHING for catfish, [place].        Stefanie Libel (daughter) home phone [phone], cell [phone]     Physical Exam  General:  Well-developed,well-nourished,in no acute distress; alert,appropriate and cooperative throughout examination Head:     Normocephalic and atraumatic without obvious abnormalities. No apparent alopecia or balding. Neck:     No deformities, masses, or tenderness noted. Lungs:     Normal respiratory effort, chest expands symmetrically. Lungs are clear to auscultation, no crackles or wheezes. Heart:     Normal rate and regular rhythm. S1 and S2 normal without gallop, murmur, click, rub or other extra sounds. Abdomen:     Bowel sounds positive,abdomen soft and non-tender without masses, organomegaly or hernias noted. Pulses:     R radial normal and L radial normal.   Extremities:     No edema Skin:     Intact without suspicious lesions or rashes Cervical Nodes:     No lymphadenopathy noted    Impression & Recommendations:  Problem # 1:  HYPERCHOLESTEROLEMIA (ICD-272.0) Assessment: Unchanged Discussed at length with pt regarding starting a Statin.  She has risk factors for heart disease : 1. brother, father who died in 3s for Heart D, and mother died at 88 from CAD 2. HTN  3. obesity.  Pt agrees  to start a new statin, as long as it is not Lipitor or Crestor.  Given her age, I will start 75 Simvastatin at mid range dose of 20mg .  Pt to rtc in 6 wks for cmet to monitor LFTs.    Her updated medication list for this problem includes:    Simvastatin 20 Mg Tabs (Simvastatin) ..... One tablet by mouth at bedtime  Orders: [facility]- Est  Level 4 ([address])   Problem # 2:  HYPERTENSION, BENIGN SYSTEMIC (ICD-401.1) Assessment: Deteriorated Elevation in HTN may be secondary to not taking her med this morning.  Her bp range in 140s when she checks it at [facility].  Given her age, I would not add another medication at this time.  Would like to monitor.  Advised pt to continue checking bp at [facility] and to call [facility] if SBP > 150.  Pt also advised to take her med before the next appt so that we can check her bp  when she's here to have accurate comparative value. Her updated medication list for this problem includes:    Atenolol-chlorthalidone 50-25 Mg Tabs (Atenolol-chlorthalidone) .Marland Kitchen... Take 1 tablet by mouth once a day  Orders: [facility]- Est  Level 4 ([address])   Problem # 3:  DEGENERATIVE JOINT DISEASE (ICD-715.90) Assessment: Comment Only Refilled rx for Ibuprofen 400mg  by mouth q 6.  Pt states that she is considering hip surgery again.  Advised pt to call Dr Margaretha Sheffield at [facility] since he has seen her in the past for this issue. Her updated medication list for this problem includes:    Tylenol Ex St Arthritis Pain 500 Mg Tabs (Acetaminophen) .Marland Kitchen... 1 tab every 4-6 hours as needed for pain    Ibu 400 Mg Tabs (Ibuprofen) .Marland Kitchen... 1 tablet by mouth every 6 hour as needed for pain    Adult Aspirin Ec Low Strength 81 Mg Tbec (Aspirin) .Marland Kitchen... 1 tablet by mouth daily  Orders: [facility]- Est  Level 4 ([address]11)   Problem # 4:  OBESITY, NOS (ICD-278.00) Assessment: Deteriorated Pt has gained weight since last visit.  BMI 29.  Advised pt regarding portion controls and snacking.  Pt states that is is baking a lot more since it is the holiday season.  Discussed with pt about eating healthy meals before she starts to bake and before attending parties so tha she does not eat too much at these occasions.  Discussed increase exercise but pt does not like to leave the house becasue it is cold outside. Orders: [facility]- Est  Level 4 ([id number])   Complete Medication List: 1)  Atenolol-chlorthalidone 50-25 Mg Tabs (Atenolol-chlorthalidone) .... Take 1 tablet by mouth once a day 2)  Tylenol Ex St Arthritis Pain 500 Mg Tabs (Acetaminophen) .Marland Kitchen.. 1 tab every 4-6 hours as needed for pain 3)  Voltaren 1 % Gel (Diclofenac sodium) .... Apply to r knee twice daily as needed for pain.  disp 1 large container 4)  Ibu 400 Mg Tabs (Ibuprofen) .Marland Kitchen.. 1 tablet by mouth every 6 hour as needed for pain 5)  Clarinex 5 Mg Tabs (Desloratadine)  .... One tablet by mouth daily as needed sinus symptoms/allergy symptoms 6)  Simvastatin 20 Mg Tabs (Simvastatin) .... One tablet by mouth at bedtime 7)  Adult Aspirin Ec Low Strength 81 Mg Tbec (Aspirin) .Marland Kitchen.. 1 tablet by mouth daily   Patient Instructions: 1)  Please schedule a follow-up appointment in 6 weeks for cholesterol. 2)  When you go to [facility], check your blood pressure there.  Keep this  log and bring in to me at next visit.  If your Blood pressure > 150, please call the [facility]. 3)  I just started you on a new medicine for your cholesterol.  It is called Simvastatin 20mg , take one tablet by mouth at bedtime. 4)  Please reduce the amount of salt in your diet.  This will help your blood pressure. 5)  Please take Aspirin 81mg  one tablet by mouth daily. 6)  Choose your Health care Power of Attorney and/or prepare a Living Will.  Please discuss with your daughter, Isaiah Serge, regarding signing  paperwork for Healthcare Power of [place].   Prescriptions: ADULT ASPIRIN EC LOW STRENGTH 81 MG TBEC (ASPIRIN) 1 tablet by mouth daily  #30 x 12   Entered and Authorized by:   CAT TA MD   Signed by:   Angeline Slim MD on 10/21/2008   Method used:   [facility]        [facility]  [address] [phone]* (retail)       [address]       [place], [place]  [address]       Ph: [phone] or [phone]       Fax: [phone]   RxID:   [id number] SIMVASTATIN 20 MG TABS (SIMVASTATIN) One tablet by mouth at bedtime  #30 x 3   Entered and Authorized by:   CAT TA MD   Signed by:   Angeline Slim MD on 10/21/2008   Method used:   Electronically to        [facility]  [address] [phone]* (retail)       [address]       [place], [place]  [address]       Ph: [phone] or [phone]       Fax: [phone]   RxID:   [id number] ATENOLOL-CHLORTHALIDONE 50-25 MG TABS (ATENOLOL-CHLORTHALIDONE) Take 1 tablet  by mouth once a day  #30 Tablet x 3   Entered and Authorized by:   CAT TA MD   Signed by:   Angeline Slim MD on 10/21/2008   Method used:   Electronically to        [facility]  [address] [phone]* (retail)       [address]       [place], [place]  [address]       Ph: [phone] or [phone]       Fax: [phone]   RxID:   [id number] CLARINEX 5 MG TABS (DESLORATADINE) One tablet by mouth daily as needed sinus symptoms/allergy symptoms  #30 x 3   Entered and Authorized by:   CAT TA MD   Signed by:   Angeline Slim MD on 10/21/2008   Method used:   Electronically to        [facility]  [address] [phone]* (retail)       [address]       [place], [place]  [address]       Ph: [phone] or [phone]       Fax: [phone]   RxID:   [id number] IBU 400 MG TABS (IBUPROFEN) 1 tablet by mouth every 6 hour as needed for pain  #90 x 3   Entered and Authorized by:  CAT TA MD   Signed by:   CAT TA MD on 10/21/2008   Method used:   Electronically to        [facility]  [address] [phone]* (retail)       [address]       [place], [place]  [address]       Ph: [phone] or [phone]       Fax: [phone]   RxID:   [id number]  ]

## 2010-12-01 NOTE — Miscellaneous (Signed)
Summary: Home Care  Patient left form to be filled out for home care.  Please fax when completed. [facility] HARRELSON  October 29, 2008 2:51 PM  form is for the home care rn to assess for pt's need of a nurse aide to come to come & assist with bathing, dressing food prep, etc. form placed in md chart box for signature & completion if she agrees.Kennon Rounds CALDWELL RN  October 29, 2008 3:02 PM  Form filled out and in To be Faxed box.  CAT TA MD  October 29, 2008 5:23 PM  Form faxed... .ERIN ODELL  October 30, 2008 12:53 PM

## 2010-12-01 NOTE — Consult Note (Signed)
Summary:  Stress Test from [facility]  [facility]   Imported By: Clydell Hakim 07/18/2009 14:47:51  _____________________________________________________________________  External Attachment:    Type:   Image     Comment:   External Document

## 2010-12-01 NOTE — Assessment & Plan Note (Signed)
Summary: Routine Exam    Vital Signs:  Patient profile:   77 year old female Height:      63.25 inches Weight:      170 pounds BMI:     29.98 Temp:     97.7 degrees F oral Pulse rate:   64 / minute BP sitting:   148 / 78  (left arm) Cuff size:   regular  Vitals Entered By: Tessie Fass CMA (August 21, 2010 10:07 AM) CC: Routine exam Is Patient Diabetic? No Pain Assessment Patient in pain? no        Primary Care Elener Custodio:  CAT TA MD  CC:  Routine exam.  History of Present Illness: 77 y/o F here for routine exam  HYPERTENSION Disease Monitoring Blood pressure range: 130s - 150s Medications: Atenolol 50-Chlorthalidone 25 once daily Compliance: yes  Lightheadedness:no  Edema: no  Chest pain: no  Dyspnea:no Prevention Exercise: not   Salt restriction:not really Refuses to take ASA   HYPERLIPIDEMIA: Refuses to take statin We have been discussing this for > 40yr.  Pt understands her risks but states that if "the Lord wants her to die then she is ready." Refuses to take ASA  History of CVA: refuses to take plavix.   refuses to take aspirin. Understands her risks.   Current Medications (verified): 1)  Atenolol-Chlorthalidone 50-25 Mg Tabs (Atenolol-Chlorthalidone) .... Take 1 Tablet By Mouth Once A Day 2)  Clarinex 5 Mg Tabs (Desloratadine) .... One Tablet By Mouth Daily As Needed Sinus Symptoms/allergy Symptoms  Allergies (verified): No Known Drug Allergies  Past History:  Past Medical History: Last updated: 03/25/2010 Did not tolerate statins - refuses to take now osteoarthritis in legs no residuals from CVA -[facility], Dr Matthias Hughs, 02/12/2009: Recurrent prox ascend polyp; multiple fragments of tubulovillous adenoma.  No high grade dysplasia or malignancy identified.  Repeat in 1 yr. -C-scope 12/22010 by Dr Matthias Hughs showed large recurring sessile adenoma in proximal colon without high-grad dysplasia.  Prox colon resection vs medical management.  Pt chose  medical mngt for now.  Repeat c-scope in 6 months - 02/24/2010; Dr Vivien Rossetti GI: Resected 1) 20mm polyp prox to Ascend C, 2) 6mm polyp at 20cm prox to anus, 3) 3mm poly at 30cm prox to anus  Past Surgical History: Last updated: 02/09/2010 R hip replacement 09/2009 by Dr Thurston Hole Cataract surgery o.u. colonic polyps 7/99, 12/01, 09/2003 colonoscopy-polypectomy - 10/19/2000 hysterectomy and BSO `68- NO PAPS NEEDED polypectomy 3/00--adenomatous, no hi gr. Dys repeat polypectomy 9/00--adenomatous  Family History: Last updated: 2008-08-25 Sis died 75yo colon CA bro died 33yo heart dz Dad died at 67yo of heart dz, Etoh, tob. Mom died at 36yo of CAD Sister alive at 27yo with HTN, DM Gr`m with DM Two sisters died 8 esop cancer, 64 yo of stomach Ca  Social History: Last updated: 02/09/2010 Lives alone in [place]. Divorced & widowed.  No tob, etoh, drugs.  +exercise (30 min/d). Good diet.  Works as Manufacturing engineer.   Daughter is Charity fundraiser at [employer].   2 grandchildren, great-grandchildren.; ENJOYS FISHING for catfish, SEWING.  Stefanie Libel (daughter) home phone [phone], cell [phone]  Code Status: DNR/DNI  Risk Factors: Alcohol Use: 0 (02/09/2010) Exercise: yes (02/09/2010)  Risk Factors: Smoking Status: never (02/09/2010)  Review of Systems General:  Denies chills, fatigue, fever, loss of appetite, malaise, sleep disorder, sweats, weakness, and weight loss. CV:  Denies chest pain or discomfort, difficulty breathing at night, fainting, fatigue, lightheadness, swelling of hands, and weight gain. Resp:  Denies chest discomfort, chest pain with inspiration, cough, shortness of breath, sputum productive, and wheezing. GI:  Denies abdominal pain, bloody stools, nausea, vomiting, and vomiting blood.  Physical Exam  General:  Well-developed,well-nourished,in no acute distress; alert,appropriate and cooperative throughout examination. Vtials reviewed.  Head:  Normocephalic and  atraumatic without obvious abnormalities. No apparent alopecia or balding. Neck:  No deformities, masses, or tenderness noted. Lungs:  Normal respiratory effort, chest expands symmetrically. Lungs are clear to auscultation, no crackles or wheezes. Heart:  Normal rate and regular rhythm. S1 and S2 normal without gallop, murmur, click, rub or other extra sounds. Abdomen:  Bowel sounds positive,abdomen soft and non-tender without masses, organomegaly or hernias noted. Msk:  No deformity or scoliosis noted of thoracic or lumbar spine.   Pulses:  equal bilaterally  Extremities:  No clubbing, cyanosis, edema, or deformity noted with normal full range of motion of all joints.   Neurologic:  alert & oriented X3 and cranial nerves II-XII intact.   Skin:  Intact without suspicious lesions or rashes Cervical Nodes:  No lymphadenopathy noted Axillary Nodes:  No palpable lymphadenopathy   Impression & Recommendations:  Problem # 1:  HYPERTENSION, BENIGN SYSTEMIC (ICD-401.1) BP at goal.  Will continue current meds.  Pt is not interested in taking more medicines.     Her updated medication list for this problem includes:    Atenolol-chlorthalidone 50-25 Mg Tabs (Atenolol-chlorthalidone) .Marland Kitchen... Take 1 tablet by mouth once a day  Orders: [facility]- Est  Level 4 (99214)Future Orders: Comp Met-[facility] ([id number]) ... 08/07/2011 CBC-[facility] (96295) ... 08/06/2011  Problem # 2:  HYPERCHOLESTEROLEMIA (ICD-272.0) Assessment: Unchanged Refuses to take statin, but would like to have her cholesterol checked, so "she can know". Orders: [facility]- Est  Level 4 (99214)Future Orders: Lipid-[facility] ([id number]) ... 08/06/2011  Problem # 3:  CVA (ICD-436) Assessment: Unchanged Refuses plavix, asa.  Understands her risks.   Orders: [facility]- Est  Level 4 (99214)Future Orders: Lipid-[facility] ([id number]) ... 08/06/2011  Problem # 4:  COLON POLYP (ICD-211.3) Assessment: Comment Only C-scope 01/2010,  Next appt 2013.  Complete  Medication List: 1)  Atenolol-chlorthalidone 50-25 Mg Tabs (Atenolol-chlorthalidone) .... Take 1 tablet by mouth once a day 2)  Clarinex 5 Mg Tabs (Desloratadine) .... One tablet by mouth daily as needed sinus symptoms/allergy symptoms  Other Orders: TD Toxoids IM 7 YR + (03474) Admin 1st Vaccine (25956)  Patient Instructions: 1)  Please schedule a follow-up appointment in March.  2)  Please make appt for cholesterol check.  This has to be fasting, so no food or drink after midnight the night before appointment. 3)  Continue your medications.   Orders Added: 1)  Comp Met-[facility] [[id number]] 2)  CBC-[facility] [85027] 3)  Lipid-[facility] [[id number]] 4)  TD Toxoids IM 7 YR + [90714] 5)  Admin 1st Vaccine [90471] 6)  [facility]- Est  Level 4 [38756]   Immunizations Administered:  Tetanus Vaccine:    Vaccine Type: Td    Site: left deltoid    Mfr: Sanofi Pasteur    Dose: 0.5 ml    Route: IM    Given by: Tessie Fass CMA    Exp. Date: 11/27/2011    Lot #: [id number]    VIS given: 09/18/08 version given August 21, 2010.   Immunizations Administered:  Tetanus Vaccine:    Vaccine Type: Td    Site: left deltoid    Mfr: Sanofi Pasteur    Dose: 0.5 ml    Route: IM    Given by: Tessie Fass CMA  Exp. Date: 11/27/2011    Lot #: [id number]    VIS given: 09/18/08 version given August 21, 2010.   Prevention & Chronic Care Immunizations   Influenza vaccine: Not documented   Influenza vaccine deferral: Refused  (08/21/2010)   Influenza vaccine due: Not Indicated    Tetanus booster: 08/21/2010: Td   Tetanus booster due: 08/01/2010    Pneumococcal vaccine: Done.  (08/01/2000)   Pneumococcal vaccine deferral: Not indicated  (08/21/2010)   Pneumococcal vaccine due: None    H. zoster vaccine: Not documented   H. zoster vaccine deferral: Refused  (08/21/2010)  Colorectal Screening   Hemoccult: Done.  (09/02/2003)   Hemoccult due: Not Indicated    Colonoscopy: Recurrent proximal ascending  polyp; mulltiple fragments of tubulovillous adenoma.  No high grade dysplasia or malignancy identified.  Per Dr Matthias Hughs.  Repeat in 1 yr.  (02/12/2009)   Colonoscopy due: 02/12/2010  Other Screening   Pap smear: Done.  (10/02/2003)   Pap smear due: Not Indicated    Mammogram: ASSESSMENT: Negative - BI-RADS 1^MM DIGITAL SCREENING  (10/22/2009)   Mammogram due: 10/22/2008    DXA bone density scan: Done.  (04/01/2004)   DXA bone density action/deferral: Refused  (08/21/2010)   DXA scan due: None    Smoking status: never  (02/09/2010)  Lipids   Total Cholesterol: 292  (06/23/2009)   Lipid panel action/deferral: Refused   LDL: 223  (06/23/2009)   LDL Direct: Not documented   HDL: 56  (06/23/2009)   Triglycerides: 66  (06/23/2009)    SGOT (AST): 15  (06/23/2009)   SGPT (ALT): 16  (06/23/2009) CMP ordered    Alkaline phosphatase: 74  (06/23/2009)   Total bilirubin: 0.4  (06/23/2009)    Lipid flowsheet reviewed?: Yes   Progress toward LDL goal: Unchanged  Hypertension   Last Blood Pressure: 148 / 78  (08/21/2010)   Serum creatinine: 0.71  (09/05/2009)   Serum potassium 3.3  (09/05/2009) CMP ordered     Hypertension flowsheet reviewed?: Yes   Progress toward BP goal: At goal  Self-Management Support :   Personal Goals (by the next clinic visit) :      Personal blood pressure goal: 140/90  (02/09/2010)     Personal LDL goal: 100  (06/02/2010)    Hypertension self-management support: Written self-care plan, Education handout  (08/21/2010)   Hypertension self-care plan printed.   Hypertension education handout printed    Hypertension self-management support not done because: Good outcomes  (08/21/2010)    Lipid self-management support: Written self-care plan, Education handout  (08/21/2010)   Lipid self-care plan printed.   Lipid education handout printed    Lipid self-management support not done because: Refused  (08/21/2010)   Nursing Instructions: Give tetanus  booster today

## 2010-12-01 NOTE — Consult Note (Signed)
[facility]: [facility]   Imported By: De Nurse 02/06/2009 14:32:33  _____________________________________________________________________  External Attachment:    Type:   Image     Comment:   External Document

## 2010-12-01 NOTE — Letter (Signed)
Summary: [facility] Lipid Letter  [facility]  [address]   [place], [place] [address]   Phone: [phone]  Fax:     03/30/2007 MRN: [id number]  Linda Macias [address] [place], [place]  [address]  Dear Ms. Rumpf:  We have carefully reviewed your last lipid profile from 03/09/2007 and the results are noted below with a summary of recommendations for lipid management.    Cholesterol:       259     Goal: <200   HDL "good" Cholesterol:   48     Goal: >40   LDL "bad" Cholesterol:   195     Goal: < 130   Triglycerides:       79     Goal: <150     TLC Diet (Therapeutic Lifestyle Change): ***Reduce Saturated Fats Polyunstaurated Fat can be up to 10% of total calories Monounsaturated Fat Fat can be up to 20% of total calories Total Fat should be no greater than 25-35% of total calories Carbohydrates should be 50-60% of total calories Protein should be approximately 15% of total calories Fiber should be at least 20-30 grams a day ***Increased fiber may help lower LDL Total Cholesterol should be < 200mg /day Consider adding plant stanol/sterols to diet (example: Benacol spread) ***A higher intake of unsaturated fat may reduce Triglycerides and Increase HDL    Adjunctive Measures (may lower LIPIDS and reduce risk of Heart Attack) include: Aerobic Exercise (20-30 minutes 3-4 times a week) Limit Alcohol Consumption Weight Reduction Aspirin 75-81 mg a day by mouth (if not allergic or contraindicated) Folic Acid 1mg  a day by mouth Dietary Fiber 20-30 grams a day by mouth  If you have any questions, please call. We appreciate being able to work with you.   Sincerely,   Neena Rhymes  MD [facility]      Appended Document: [facility] Lipid Letter letter sent  admin team

## 2010-12-01 NOTE — Consult Note (Signed)
Summary: [facility], repeat 2 yrs  [facility] - colonoscopy   Imported By: De Nurse 03/24/2010 16:16:15  _____________________________________________________________________  External Attachment:    Type:   Image     Comment:   External Document  Appended Document: [facility] - colonoscopy    Clinical Lists Changes  Observations: Added new observation of PAST MED HX: Did not tolerate statins - refuses to take now osteoarthritis in legs no residuals from CVA -[facility], Dr Matthias Hughs, 02/12/2009: Recurrent prox ascend polyp; multiple fragments of tubulovillous adenoma.  No high grade dysplasia or malignancy identified.  Repeat in 1 yr. -C-scope 12/22010 by Dr Matthias Hughs showed large recurring sessile adenoma in proximal colon without high-grad dysplasia.  Prox colon resection vs medical management.  Pt chose medical mngt for now.  Repeat c-scope in 6 months - 02/24/2010; Dr Vivien Rossetti GI: Resected 1) 20mm polyp prox to Ascend C, 2) 6mm polyp at 20cm prox to anus, 3) 3mm poly at 30cm prox to anus (03/25/2010 10:38) Added new observation of PRIMARY MD: Kendalynn Wideman MD (03/25/2010 10:38)        Past History:  Past Medical History: Did not tolerate statins - refuses to take now osteoarthritis in legs no residuals from CVA -[facility], Dr Matthias Hughs, 02/12/2009: Recurrent prox ascend polyp; multiple fragments of tubulovillous adenoma.  No high grade dysplasia or malignancy identified.  Repeat in 1 yr. -C-scope 12/22010 by Dr Matthias Hughs showed large recurring sessile adenoma in proximal colon without high-grad dysplasia.  Prox colon resection vs medical management.  Pt chose medical mngt for now.  Repeat c-scope in 6 months - 02/24/2010; Dr Vivien Rossetti GI: Resected 1) 20mm polyp prox to Ascend C, 2) 6mm polyp at 20cm prox to anus, 3) 3mm poly at 30cm prox to anus

## 2010-12-01 NOTE — Assessment & Plan Note (Signed)
Summary: joint injection/ts   Vital Signs:  Patient Profile:   77 Years Old Female Height:     63.25 inches Weight:      171.6 pounds Pulse rate:   62 / minute BP sitting:   139 / 76  (right arm)  Pt. in pain?   yes    Location:   right knee    Intensity:   8  Vitals Entered By: Arlyss Repress CMA, (May 15, 2008 9:44 AM)              Is Patient Diabetic? No     Chief Complaint:  right knee injection.  History of Present Illness: I saw Ms Iacovelli in urgent care center last night.  She is complaining of increased pain in her right knee due to arthritis.  It is radiating up towards her thigh and groin.  Tylenol and ibuprofen are not helping.  She is here today for a knee injection.    Current Allergies: No known allergies       Physical Exam  General:     NAD, pleasant, alert, oriented. Msk:     R knee- full range of motion, no erythema or effusion.    Impression & Recommendations:  Problem # 1:  DEGENERATIVE JOINT DISEASE (ICD-715.90) Assessment: Deteriorated Right knee injection performed today.  See procedure note below.  Continue ibuprofen and/or tylenol as needed for pain.  F/U 7/28 with Dr. Beverely Low. Her updated medication list for this problem includes:    Tylenol Ex St Arthritis Pain 500 Mg Tabs (Acetaminophen) .Marland Kitchen... 1 tab every 4-6 hours as needed for pain  Orders: Joint Aspirate / Injection, Large (20610)   Complete Medication List: 1)  Atenolol-chlorthalidone 50-25 Mg Tabs (Atenolol-chlorthalidone) .... Take 1 tablet by mouth once a day 2)  Tylenol Ex St Arthritis Pain 500 Mg Tabs (Acetaminophen) .Marland Kitchen.. 1 tab every 4-6 hours as needed for pain    ]  Procedure Note Last Tetanus: Done. (08/01/2000)  Injections: Indication: chronic pain  Procedure # 1: joint injection    Region: medial    Location: right knee    Technique: 25 guage 1.5'' needle    Medication: 3ml marcaine + 40mg  kenalog    Anesthesia: none    Comment: pt tolerated  procedure well.  No bleeding.

## 2010-12-01 NOTE — Assessment & Plan Note (Signed)
Summary: discuss wt loss   Vital Signs:  Patient Profile:   77 Years Old Female Height:     63.25 inches Weight:      167 pounds BMI:     29.46 Temp:     99.1 degrees F Pulse rate:   99 / minute BP sitting:   150 / 85  Pt. in pain?   no  Vitals Entered By: Jone Baseman CMA (August 14, 2008 3:44 PM)                 Flu Vaccine Next Due:  Refused Last PAP:  Done. (10/02/2003 12:00:00 AM) PAP Next Due:  Not Indicated   PCP:  Katja Blue MD  Chief Complaint:  discuss wt loss.  History of Present Illness: Wt loss:  pt is losing weight because she was informed that she has hyperlipidemia but she does not want to take medication. She is losing weight by eating more chicken ceasar salad, bake potatoe, and greens/vegetables.  The reason pt is here is because she would like me to write a letter to her dentist.  She has an upper partial that is 75 years old and it no longer fits as well as it has before.  She attributes the fit problem to her weigth loss.  The pt medicaid and it only pays for dental equipment (dentures, partials) once every 10 yrs.  She would like the letter to state that her weight loss is medically necessary.  Of note pt weighed 175.9 lb on 01/05/08 and 167 lb today.  HTN:  taking Atenolol-Chlortalidone 50/25mg .  Pt states that until this year she was only taking half a tablet per day.  States she does not want to take another medication to better control her BP and decrease risk of MI and stroke.  Pt states that she is 77 years old and does not need to make anymore changes to her life.  No chest, headache, sob.  Hyperlipidemia:  fasting lipid panel on 04/23/08 showed LDL 198, HDL 43, Trig 95.  Pt had brother who died at 60 from HD, father died at 93 from HD, and mother who died at 76 from CAD.  Pt's risk factors for MI include:  Increasing age (over age 89), Family history of coronary artery disease, High blood pressure, elevated LDL.   When I inquired why she does not  want to take medication pt states that she read that cholesterol medications affect the liver.  She does not feel that at this stage in her life she does not need to add another medication.  Flu vaccine:  Pt declined      Current Allergies: No known allergies   Past Medical History:    Reviewed history from 05/28/2008 and no changes required:       Did not tolerate statins - refuses to take now       osteoarthritis in legs       no residuals from CVA  Past Surgical History:    Reviewed history from 05/28/2008 and no changes required:       Cataract surgery o.u.       colonic polyps 7/99, 12/01, 09/2003       colonoscopy-polypectomy - 10/19/2000       hysterectomy and BSO `68- NO PAPS NEEDED       polypectomy 3/00--adenomatous, no hi gr. Dys       repeat polypectomy 9/00--adenomatous   Family History:    Sis died 75yo colon CA  bro died 75yo heart dz    Dad died at 33yo of heart dz, Etoh, tob.    Mom died at 79yo of CAD    Sister alive at 79yo with HTN, DM    Gr`m with DM    Two sisters died 93 esop cancer, 51 yo of stomach Ca   Risk Factors:  Mammogram History:     Date of Last Mammogram:  10/23/2007   PAP Smear History:     Date of Last PAP Smear:  10/02/2003   Colonoscopy History:     Date of Last Colonoscopy:  09/02/2003     Physical Exam  General:     Well-developed, overweight, in no acute distress; alert,appropriate and cooperative throughout examination Lungs:     Normal respiratory effort, chest expands symmetrically. Lungs are clear to auscultation, no crackles or wheezes. Heart:     Normal rate and regular rhythm. S1 and S2 normal without gallop, murmur, click, rub or other extra sounds. Abdomen:     Bowel sounds positive,abdomen soft and non-tender without masses, organomegaly or hernias noted. Neurologic:     alert & oriented X3.      Impression & Recommendations:  Problem # 1:  HYPERTENSION, BENIGN SYSTEMIC (ICD-401.1) Assessment:  Deteriorated BP elevated today.  Pt has been bette controlled in the past with sbp in 140s in June and July of this year.  Pt is very resistant to changes to medications.  I've asked her to come back in one week for a nurse visit to check her blood pressure.  The patient likes to come to [facility]  only once a year but has agreed to come back in 4-6 weeks to discuss bp.  Her updated medication list for this problem includes:    Atenolol-chlorthalidone 50-25 Mg Tabs (Atenolol-chlorthalidone) .Marland Kitchen... Take 1 tablet by mouth once a day  Orders: [facility]- Est Level  3 (09811)   Problem # 2:  HYPERCHOLESTEROLEMIA (ICD-272.0) Assessment: Deteriorated Pt is resistant to start antilipid medication.  She took lipitor in the past and did not tolerate it.  I suggested starting a different type of lipid medication but she refused. Orders: [facility]- Est Level  3 (99213)   Problem # 3:  OTHER SPEC DISORDERS TEETH&SUPPORTING STRUCTURES (ICD-525.8) Assessment: Comment Only I've written a letter to her dentist, Dr Benita Gutter, stating that weight loss and exercise can reduce the complications of hypertension and hyperlipidemia.  This is in her emr. Orders: [facility]- Est Level  3 (91478)   Complete Medication List: 1)  Atenolol-chlorthalidone 50-25 Mg Tabs (Atenolol-chlorthalidone) .... Take 1 tablet by mouth once a day 2)  Tylenol Ex St Arthritis Pain 500 Mg Tabs (Acetaminophen) .Marland Kitchen.. 1 tab every 4-6 hours as needed for pain 3)  Voltaren 1 % Gel (Diclofenac sodium) .... Apply to r knee twice daily as needed for pain.  disp 1 large container    ]

## 2010-12-01 NOTE — Letter (Signed)
Summary: Generic Letter  [facility]  [address]   [place], [place] [address]   Phone: [phone]  Fax: [phone]    08/14/2008  [facility] [address] [place], [place]  [address]  Dear Dr.Russel;  This letter is on behalf of Ms. Linda Macias.  According to our records she has lost almost 10 pounds in the past six months.  Weight loss and exercise are important parts of decreasing her cardiovascular risks since the patient has long standing Hypertension and Hyperlipidemia.  Sincerely,    CAT TA MD [facility]

## 2010-12-01 NOTE — Assessment & Plan Note (Signed)
Summary: arthritis in back wp   Vital Signs:  Patient Profile:   77 Years Old Female Height:     63.25 inches Weight:      175.4 pounds Temp:     97.8 degrees F Pulse rate:   64 / minute BP sitting:   156 / 69  (left arm)  Pt. in pain?   no  Vitals Entered By: Alphia Kava (February 23, 2008 8:53 AM)              Is Patient Diabetic? No     Chief Complaint:  backpain.  History of Present Illness: 77 yo F c/o  1. back pain.  started a fewy days ago when she was sitting sewing for hours, hurt as she got up.  still hurts today.  started in left back but now bilatearl.  no numbness, tinigling,  no loss of bowel or bladder function.  no pain down leg.  has h/o arthritis but last time her back hurt she had a bladder infection.  taking iburpfen 200mg  which makes her loopy.  took lortab with last uti without problem.      Current Allergies: No known allergies       Physical Exam  General:     Well-developed,well-nourished,in no acute distress; alert,appropriate and cooperative throughout examination Msk:     legs - ankle and patellar reflex intact,  normal gait.  normal hip rom.  str at hips, knee 5/5 bilaterally.   back - pain is bilateral lumbar no over vertebra but medial.  and goes out to edge of back.  no point tenderness.  no cva tenderness.    Impression & Recommendations:  Problem # 1:  BACK PAIN (ICD-724.5) Assessment: New has uti will tx with bactrim for 5 days. no cva tenderness, no fever, therefore no pyelo.   refilled lortab.  may also be arthritis contributing,r ecommeneded tyleonl.  rtc if worse, if fever. Her updated medication list for this problem includes:    Ibuprofen 400 Mg Tabs (Ibuprofen) .Marland Kitchen... 1 tab by mouth q 6 hours as needed for pain    Lortab 5 5-500 Mg Tabs (Hydrocodone-acetaminophen) .Marland Kitchen... 1 poq6 as needed  Orders: Urinalysis-[facility] (00000) Urine Culture-[facility] ([id number]) [facility]- Est  Level 4 (63016)   Problem # 2:  ARTHRITIS, CERVICAL  SPINE (ICD-721.90) Assessment: Deteriorated tyleonl prn Orders: [facility]- Est  Level 4 (01093)   Complete Medication List: 1)  Atenolol-chlorthalidone 50-25 Mg Tabs (Atenolol-chlorthalidone) .... Take 1 tablet by mouth once a day 2)  Ibuprofen 400 Mg Tabs (Ibuprofen) .Marland Kitchen.. 1 tab by mouth q 6 hours as needed for pain 3)  Voltaren 1 % Gel (Diclofenac sodium) .... Apply 2 grams 4x daily  dispense 100 gram tube 4)  Lortab 5 5-500 Mg Tabs (Hydrocodone-acetaminophen) .Marland Kitchen.. 1 poq6 as needed 5)  Bactrim Ds 800-160 Mg Tabs (Sulfamethoxazole-trimethoprim) .Marland Kitchen.. 1 by mouth two times a day for 5 days     Prescriptions: BACTRIM DS 800-160 MG  TABS (SULFAMETHOXAZOLE-TRIMETHOPRIM) 1 by mouth two times a day for 5 days  #10 x 0   Entered and Authorized by:   Rolm Gala MD   Signed by:   Rolm Gala MD on 02/23/2008   Method used:   Print then Give to Patient   RxID:   [id number] LORTAB 5 5-500 MG  TABS (HYDROCODONE-ACETAMINOPHEN) 1 poq6 as needed  #10 x 0   Entered and Authorized by:   Rolm Gala MD   Signed by:   Rolm Gala MD on  02/23/2008   Method used:   Print then Give to Patient   RxID:   [id number]  ] Laboratory Results   Urine Tests  Date/Time Received: February 23, 2008 10:27 AM  Date/Time Reported: February 23, 2008 11:03 AM   Routine Urinalysis   Color: yellow Appearance: Clear Glucose: negative   (Normal Range: Negative) Bilirubin: negative   (Normal Range: Negative) Ketone: negative   (Normal Range: Negative) Spec. Gravity: 1.020   (Normal Range: 1.003-1.035) Blood: negative   (Normal Range: Negative) pH: 6.0   (Normal Range: 5.0-8.0) Protein: trace   (Normal Range: Negative) Urobilinogen: 0.2   (Normal Range: 0-1) Nitrite: negative   (Normal Range: Negative) Leukocyte Esterace: small   (Normal Range: Negative)  Urine Microscopic WBC/HPF: 5-10 RBC/HPF: rare Bacteria/HPF: 3+ rods Mucous/HPF: trace Epithelial/HPF: 1-5 Casts/LPF: occ hyaline      Comments: urine sent for culture ...................................................................DONNA [facility]  February 23, 2008 11:03 AM

## 2010-12-01 NOTE — Assessment & Plan Note (Signed)
Summary: diarrhea, s/p THR, follow up anemia ,df   Vital Signs:  Patient profile:   77 year old female Weight:      162.0 pounds Temp:     98.9 degrees F Pulse rate:   81 / minute BP sitting:   127 / 82  (left arm)  Vitals Entered By: Starleen Blue RN (September 04, 2009 10:21 AM) Is Patient Diabetic? No Pain Assessment Patient in pain? no        Primary Care Provider:  CAT TA MD   History of Present Illness: 4 weeks of diarrhea, started in the NH ([facility]) when she was there S/P R THR in early October. Having up to 8-10 stools per day, she reports that cultures were negative (reliable historian, will try to get records).  Denies blood but stools are dark (has been taking Pepto-bismol). Denies abdominal pain and cramping.  Has several after eating, first thought it was the food at the NH, but happening now that she is home.   Given 2 units of blood for a Hgb of 5, several weeks post op.  Was not started on iron.  Anticoagulated for DVT prophylaxis, she is moving around well.  INR today was 3.4, dosage was cut back, recheck in one week.  She asked that we make apt. with Dr. Matthias Hughs, as she was due to see him and apparently has regular procedural colonoscopies with him to "trim down a growth" in her colon.  Patient not known to me, seen as a work-in.  Habits & Providers  Alcohol-Tobacco-Diet     Tobacco Status: never  Allergies: No Known Drug Allergies  Past History:  Past Medical History: Last updated: 02/25/2009 Did not tolerate statins - refuses to take now osteoarthritis in legs no residuals from CVA [facility], Dr Matthias Hughs, 02/12/2009: Recurrent prox ascend polyp; multiple fragments of tubulovillous adenoma.  No high grade dysplasia or malignancy identified.  Repeat in 1 yr.  Past Surgical History: Last updated: 05/28/2008 Cataract surgery o.u. colonic polyps 7/99, 12/01, 09/2003 colonoscopy-polypectomy - 10/19/2000 hysterectomy and BSO `68- NO PAPS  NEEDED polypectomy 3/00--adenomatous, no hi gr. Dys repeat polypectomy 9/00--adenomatous  Social History: Last updated: 10/21/2008 Lives alone in [place]. Divorced & widowed. No tob, etoh, drugs.  +exercise (30 min/d). Good diet.  Works as Manufacturing engineer.  Daughter is Charity fundraiser at [employer].  2 grandchildren, great-grandchildren.; ENJOYS FISHING for catfish, SEWING.  Stefanie Libel (daughter) home phone [phone], cell [phone]  Review of Systems       The patient complains of anorexia and weight loss.  The patient denies fever, syncope, dyspnea on exertion, peripheral edema, abdominal pain, melena, hematochezia, and severe indigestion/heartburn.    Physical Exam  General:  Well appearing, alert, oriented 77 year old, dressed all in red today. Eyes:  pallor of lid conjunctiva Lungs:  normal respiratory effort and normal breath sounds.   Heart:  normal rate and no murmur.   Abdomen:  soft, non-tender, and normal bowel sounds.   Rectal:  Good rectal tone, no impaction, black stool (taking pepto bismol)-heme negative. Skin:  Hip incision well healed   Impression & Recommendations:  Problem # 1:  DIARRHEA (ICD-787.91) Unclear cause, doubt pseudomenbranous colitis, negative stool cultures while in the nursing home.  Trial of Questran lite, 1/2 scoop daily to start.  Called patient and she thought there was a difference.  She would like to see Dr. Matthias Hughs, apt made,as she is an established patient. Orders: Basic Met-[facility] [phone]) [facility]- Est Level  3 (  09811)  Problem # 2:  ANEMIA, SECONDARY TO ACUTE BLOOD LOSS (ICD-285.1) Hbg at 10, microcytic-begin ferrous sulfate 325 mg daily with OJ on an empty stomach, discussed 6 months of supplement needed in order to rebuild stores.  Had transfusion in NH for reported Hbg of 5. Orders: CBC-[facility] (91478) [facility]- Est Level  3 (29562)  Her updated medication list for this problem includes:    Ferrous Sulfate 325 (65 Fe) Mg Tabs (Ferrous sulfate)  ..... One daily on empty stomach with oj  Problem # 3:  HIP JOINT REPLACEMENT BY OTHER MEANS (ICD-V43.64) Doubt that she needs to continue anticoagulation beyond 6 week period which will be up next week.  INR today was 3.4, dosage was reduced, she is moving around and independent in ADLs.  Will send note to ortho with question as to when to stop coumadin. Orders: INR/PT-[facility] (13086)  Complete Medication List: 1)  Atenolol-chlorthalidone 50-25 Mg Tabs (Atenolol-chlorthalidone) .... Take 1 tablet by mouth once a day 2)  Tylenol Ex St Arthritis Pain 500 Mg Tabs (Acetaminophen) .Marland Kitchen.. 1 tab every 4-6 hours as needed for pain 3)  Voltaren 1 % Gel (Diclofenac sodium) .... Apply to r knee twice daily as needed for pain.  disp 1 large container 4)  Ibu 400 Mg Tabs (Ibuprofen) .Marland Kitchen.. 1 tablet by mouth every 6 hour as needed for pain 5)  Clarinex 5 Mg Tabs (Desloratadine) .... One tablet by mouth daily as needed sinus symptoms/allergy symptoms 6)  Adult Aspirin Ec Low Strength 81 Mg Tbec (Aspirin) .Marland Kitchen.. 1 tablet by mouth daily 7)  Questran Light 4 Gm/dose Powd (Cholestyramine light) .... 1/2 scoop in water 1-2 times per day, 8)  Ferrous Sulfate 325 (65 Fe) Mg Tabs (Ferrous sulfate) .... One daily on empty stomach with oj  Patient Instructions: 1)  Questran lite:  1/2 scoop daily to firm stool 2)  Iron supplement daily for 6 months to build stores Prescriptions: FERROUS SULFATE 325 (65 FE) MG TABS (FERROUS SULFATE) one daily on empty stomach with OJ  #30 x 6   Entered by:   Luretha Murphy NP   Authorized by:   Zachery Dauer MD   Signed by:   Luretha Murphy NP on 09/05/2009   Method used:   [facility]        [facility]  [address] [phone]* (retail)       [address]       [place], [place]  [id number]       Ph: [phone] or [phone]       Fax: [phone]   RxID:   [id number] Lanetta Inch LIGHT 4 GM/DOSE POWD (CHOLESTYRAMINE LIGHT) 1/2 scoop in water 1-2 times per day,   #1 x 0   Entered by:   Luretha Murphy NP   Authorized by:   Zachery Dauer MD   Signed by:   Luretha Murphy NP on 09/04/2009   Method used:   [facility]        [facility]  [address] [phone]* (retail)       [address]       [place], [place]  [id number]       Ph: [phone] or [phone]       Fax: [phone]   RxID:   [id number]    ANTICOAGULATION RECORD  NEW REGIMEN & LAB RESULTS Anticoag. Dx: hip replacement Current INR Goal Range: 2 - 3  Current INR: 3.4 Current Coumadin Dose(mg): 4 mg tablets Regimen: 2 mg -Thurs, Sat, Mon;   4 mg - Fri, Sun, Tues, Wed Coagulation Comments: pt to stop 6 week post op Provider: Ta Repeat testing in: n/a Other Comments: ...............test performed by......Marland KitchenBonnie A. [place], [place] (ASCP)   Dose has been reviewed with patient or caretaker during this visit. Reviewed by: Luretha Murphy, FNP

## 2010-12-01 NOTE — Assessment & Plan Note (Signed)
Summary: PHYSICAL/[facility]   Vital Signs:  Patient Profile:   77 Years Old Female Height:     63.25 inches Weight:      166. pounds Temp:     98.1 degrees F Pulse rate:   73 / minute BP sitting:   147 / 82  (left arm)  Pt. in pain?   no  Vitals Entered By: Alphia Kava (May 28, 2008 10:14 AM)              Is Patient Diabetic? No     History of Present Illness: 77 yo woman who RTC today for CPE and discussion of labs.  Pt's only complaint is her R knee arthritis.  Steroid injxn earlier this month provided no relief and pt states that tylenol and ibuprofen are not helping.  Ambulates w/ cane b/c she states 'i never know when the knee is going to give out on me'.  Pain now in R groin- pt states this is not hip pain but radiates up from her knee  Health Maintainence- Pt had mammogram earlier this year, has colonoscopy every 2 yrs w/ Buccini, and had hysterectomy in 1968- so no pap required.    Prior Medications Reviewed Using: Patient Recall  Current Allergies: No known allergies   Past Medical History:    Did not tolerate statins - refuses to take now    osteoarthritis in legs    no residuals from CVA  Past Surgical History:    Cataract surgery o.u.    colonic polyps 7/99, 12/01, 09/2003    colonoscopy-polypectomy - 10/19/2000    hysterectomy and BSO `68- NO PAPS NEEDED    polypectomy 3/00--adenomatous, no hi gr. Dys    repeat polypectomy 9/00--adenomatous   Social History:    Moved from [facility] 8/99.Lives alone in [place]. Divorced & widowed. No tob, etoh, drugs.  +exercise (30 min/d). Good diet.  Works as Manufacturing engineer.  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  2 grandchildren, great-grandchildren.; ENJOYS FISHING for catfish, SEWING.    Review of Systems      See HPI   Physical Exam  General:     NAD, pleasant, alert, oriented. Head:     Normocephalic and atraumatic without obvious abnormalities. No apparent alopecia or balding. Eyes:     No corneal or conjunctival  inflammation noted. EOMI. Perrla.  Ears:     External ear exam shows no significant lesions or deformities.  Otoscopic examination reveals clear canals, tympanic membranes are intact bilaterally without bulging, retraction, inflammation or discharge. Hearing is grossly normal bilaterally. Nose:     External nasal examination shows no deformity or inflammation. Nasal mucosa are pink and moist without lesions or exudates, some edema of nasal turbinates. Mouth:     Oral mucosa and oropharynx without lesions or exudates.  Teeth in good repair. Neck:     No deformities, masses, or tenderness noted. Breasts:     No mass, nodules, thickening, tenderness, bulging, retraction, inflamation, nipple discharge or skin changes noted.   Lungs:     Normal respiratory effort, chest expands symmetrically. Lungs are clear to auscultation, no crackles or wheezes. Heart:     Normal rate and regular rhythm. S1 and S2 normal without gallop, murmur, click, rub or other extra sounds. Abdomen:     obese, soft, NT/ND, +BS Genitalia:     Deferred at pt's request Msk:     R knee- full range of motion, no erythema, small pocket of swelling at previous injxn site.  +crepitus.  Limited Figure of 4 test- causes pain in groin which pt attributes to knee Pulses:     +2 DP and radial pulses Extremities:     No clubbing, cyanosis, edema Neurologic:     No cranial nerve deficits noted. Station and gait are normal. Sensory, motor and coordinative functions appear intact.  Reflexes +2 and symmetric Skin:     Intact without suspicious lesions or rashes Cervical Nodes:     No lymphadenopathy noted Axillary Nodes:     No palpable lymphadenopathy    Impression & Recommendations:  Problem # 1:  DEGENERATIVE JOINT DISEASE (ICD-715.90) Assessment: Deteriorated Pt's only complaint is R knee and groin pain.  Tylenol and Ibuprofen not helping.  Using Voltaren gel w/out relief.  Injxn did not provide relief but given localized  edema wonder if injxn was truly intraarticular.  Pt open to the idea of orthopedic referral to discuss her options. Her updated medication list for this problem includes:    Tylenol Ex St Arthritis Pain 500 Mg Tabs (Acetaminophen) .Marland Kitchen... 1 tab every 4-6 hours as needed for pain  Orders: Orthopedic Surgeon Referral (Ortho Surgeon) [facility] - Est  65+ [phone])   Problem # 2:  EXAMINATION, ROUTINE MEDICAL (ICD-V70.0) Assessment: Unchanged Pt's PE WNL.  Health Maintainence current. Orders: [facility] - Est  65+ [phone])   Problem # 3:  HYPERTENSION, BENIGN SYSTEMIC (ICD-401.1) Assessment: Unchanged BP controlled on current meds but not at goal.  Pt not willing to make changes to med regimen at this time.  BP not high enough to warrant arguement.  Will need followed at future visits. Her updated medication list for this problem includes:    Atenolol-chlorthalidone 50-25 Mg Tabs (Atenolol-chlorthalidone) .Marland Kitchen... Take 1 tablet by mouth once a day  Orders: [address] - Est  65+ [phone])   Problem # 4:  HYPERCHOLESTEROLEMIA (ICD-272.0) Assessment: Unchanged Pt's LDL still near 200.  Pt had bad rxn to statins in past and not willing to take them again.  Making changes to her diet- limiting fried foods.  Has lost  ~10 lbs since spring.  She is pleased w/ her wt loss.  Pt aware of cardiac risks of elevated cholesterol.  Doesn't want meds at this time. Orders: [facility] - Est  65+ [phone])   Complete Medication List: 1)  Atenolol-chlorthalidone 50-25 Mg Tabs (Atenolol-chlorthalidone) .... Take 1 tablet by mouth once a day 2)  Tylenol Ex St Arthritis Pain 500 Mg Tabs (Acetaminophen) .Marland Kitchen.. 1 tab every 4-6 hours as needed for pain 3)  Voltaren 1 % Gel (Diclofenac sodium) .... Apply to r knee twice daily as needed for pain.  disp 1 large container   Patient Instructions: 1)  Go talk to the orthopedic surgeon about your options for your knee 2)  Continue the Tylenol arthritis, Ibuprofen, and Voltaren gel for your pain 3)   Keep as active as possible 4)  Take care of yourself!  I'll miss you!!!   Prescriptions: VOLTAREN 1 %  GEL (DICLOFENAC SODIUM) apply to R knee twice daily as needed for pain.  Disp 1 large container  #1 x 3   Entered and Authorized by:   Neena Rhymes  MD   Signed by:   Neena Rhymes  MD on 05/28/2008   Method used:   [facility]ron[facility] ...       [facility]  [address] [phone]*       [address]       [place]  [place], [place]  [address]       Ph: [phone] or [phone]       Fax: [phone]   RxID:   [id number]  ]  Vital Signs:  Patient Profile:   76 Years Old Female Height:     63.25 inches Weight:      166. pounds Temp:     98.1 degrees F Pulse rate:   73 / minute BP sitting:   147 / 82

## 2010-12-01 NOTE — Assessment & Plan Note (Signed)
Summary: bp check/kh  Nurse Visit  B P checked manually using regular adult cuff. BP RA  140/76,   LA  144/80 pulse 58. will forward  message to Dr. Janalyn Harder. Also patient wanted Dr. Janalyn Harder to know that Dr. Thurston Hole had prescribed Amoxicillin 500 mg   4 caps one hour prior to procedure. she is to have dental cleaning.  Linda Lo RN  February 13, 2010 12:03 PM   Allergies: No Known Drug Allergies  Orders Added: 1)  No Charge Patient Arrived (NCPA0) [NCPA0] These BPs are acceptable.  Goal BP is 140/90.  Linda Terpening MD  February 13, 2010 1:28 PM

## 2010-12-01 NOTE — Assessment & Plan Note (Signed)
Summary: f/u,df   Vital Signs:  Patient profile:   77 year old female Height:      63.25 inches Weight:      169 pounds BMI:     29.81 Temp:     98.7 degrees F oral Pulse rate:   67 / minute BP sitting:   138 / 64  (left arm) Cuff size:   regular  Vitals Entered By: Tessie Fass CMA (June 02, 2010 2:47 PM) CC: F/U htn   Primary Care Provider:  CAT TA MD  CC:  F/U htn.  History of Present Illness: 77 y/o F here for f/u of HTN  HYPERTENSION Disease Monitoring Blood pressure range: 140s-160s here in clinic  Medications: atenolol 50-chlorthiadone 25 daily  Compliance: yes  Lightheadedness: no   Edema:no  Chest pain: no  Dyspnea:  no Prevention Exercise: yes  Salt restriction: sometimes  Preventative: HLD:  I'm almost 77 y/o.  I've outlived most people I know and most people in my family.   I'm fine with the little blood pressure pill, but I don't want to take anymore medications.  I feel fine. We discussed risk/benefits of taking a statin (CVA, MI, etc), pt did not want to take statin.  I will continue to discuss with pt.    Colonoscopy: polyp on last exam, will repeat in 2 yrs.     Current Medications (verified): 1)  Atenolol-Chlorthalidone 50-25 Mg Tabs (Atenolol-Chlorthalidone) .... Take 1 Tablet By Mouth Once A Day 2)  Clarinex 5 Mg Tabs (Desloratadine) .... One Tablet By Mouth Daily As Needed Sinus Symptoms/allergy Symptoms  Allergies (verified): No Known Drug Allergies  Past History:  Past Medical History: Last updated: 03/25/2010 Did not tolerate statins - refuses to take now osteoarthritis in legs no residuals from CVA -[facility], Dr Matthias Hughs, 02/12/2009: Recurrent prox ascend polyp; multiple fragments of tubulovillous adenoma.  No high grade dysplasia or malignancy identified.  Repeat in 1 yr. -C-scope 12/22010 by Dr Matthias Hughs showed large recurring sessile adenoma in proximal colon without high-grad dysplasia.  Prox colon resection vs medical  management.  Pt chose medical mngt for now.  Repeat c-scope in 6 months - 02/24/2010; Dr Vivien Rossetti [facility]: Resected 1) 20mm polyp prox to Ascend C, 2) 6mm polyp at 20cm prox to anus, 3) 3mm poly at 30cm prox to anus  Past Surgical History: Last updated: 02/09/2010 R hip replacement 09/2009 by Dr Thurston Hole Cataract surgery o.u. colonic polyps 7/99, 12/01, 09/2003 colonoscopy-polypectomy - 10/19/2000 hysterectomy and BSO `68- NO PAPS NEEDED polypectomy 3/00--adenomatous, no hi gr. Dys repeat polypectomy 9/00--adenomatous  Family History: Last updated: 08-18-2008 Sis died 75yo colon CA bro died 33yo heart dz Dad died at 47yo of heart dz, Etoh, tob. Mom died at 73yo of CAD Sister alive at 23yo with HTN, DM Gr`m with DM Two sisters died 79 esop cancer, 19 yo of stomach Ca  Social History: Last updated: 02/09/2010 Lives alone in [place]. Divorced & widowed.  No tob, etoh, drugs.  +exercise (30 min/d). Good diet.  Works as Manufacturing engineer.   Daughter is Charity fundraiser at [employer].   2 grandchildren, great-grandchildren.; ENJOYS FISHING for catfish, SEWING.  Stefanie Libel (daughter) home phone [phone], cell [phone]  Code Status: DNR/DNI  Risk Factors: Alcohol Use: 0 (02/09/2010) Exercise: yes (02/09/2010)  Risk Factors: Smoking Status: never (02/09/2010)  Review of Systems       per hpi   Physical Exam  General:  Well-developed,well-nourished,in no acute distress; alert,appropriate and cooperative throughout examination. vitals reviewed  Lungs:  Normal respiratory effort, chest expands symmetrically. Lungs are clear to auscultation, no crackles or wheezes. Heart:  Normal rate and regular rhythm. S1 and S2 normal without gallop, murmur, click, rub or other extra sounds. Abdomen:  Bowel sounds positive,abdomen soft and non-tender without masses, organomegaly or hernias noted. Neurologic:  alert & oriented X3.     Impression & Recommendations:  Problem # 1:  HYPERTENSION,  BENIGN SYSTEMIC (ICD-401.1)  BP at goal today.  She has had previous high BPs in past, but recheck BPs have been at goal. Will continue current meds.  Her updated medication list for this problem includes:    Atenolol-chlorthalidone 50-25 Mg Tabs (Atenolol-chlorthalidone) .Marland Kitchen... Take 1 tablet by mouth once a day  Orders: [facility]- Est Level  3 ([address])  Problem # 2:  HYPERCHOLESTEROLEMIA (ICD-272.0) Assessment: Unchanged  LDL 223.  Pt did not tolerate statin now  refuses statin.  STates she eats beans to control her cholesterol.    Orders: [facility]- Est Level  3 ([address])  Complete Medication List: 1)  Atenolol-chlorthalidone 50-25 Mg Tabs (Atenolol-chlorthalidone) .... Take 1 tablet by mouth once a day 2)  Clarinex 5 Mg Tabs (Desloratadine) .... One tablet by mouth daily as needed sinus symptoms/allergy symptoms  Other Orders: Dexa scan (Dexa scan)   Prevention & Chronic Care Immunizations   Influenza vaccine: Not documented   Influenza vaccine due: Not Indicated    Tetanus booster: 08/01/2000: Done.   Tetanus booster due: 08/01/2010    Pneumococcal vaccine: Done.  (08/01/2000)   Pneumococcal vaccine due: None    H. zoster vaccine: Not documented  Colorectal Screening   Hemoccult: Done.  (09/02/2003)   Hemoccult due: Not Indicated    Colonoscopy: Recurrent proximal ascending polyp; mulltiple fragments of tubulovillous adenoma.  No high grade dysplasia or malignancy identified.  Per Dr Matthias Hughs.  Repeat in 1 yr.  (02/12/2009)   Colonoscopy due: 02/12/2010  Other Screening   Pap smear: Done.  (10/02/2003)   Pap smear due: Not Indicated    Mammogram: ASSESSMENT: Negative - BI-RADS 1^MM DIGITAL SCREENING  (10/22/2009)   Mammogram due: 10/22/2008    DXA bone density scan: Done.  (04/01/2004)   DXA bone density action/deferral: Ordered  (06/02/2010)   DXA scan due: None    Smoking status: never  (02/09/2010)  Lipids   Total Cholesterol: 292  (06/23/2009)   Lipid panel  action/deferral: Refused   LDL: 223  (06/23/2009)   LDL Direct: Not documented   HDL: 56  (06/23/2009)   Triglycerides: 66  (06/23/2009)    SGOT (AST): 15  (06/23/2009)   SGPT (ALT): 16  (06/23/2009)   Alkaline phosphatase: 74  (06/23/2009)   Total bilirubin: 0.4  (06/23/2009)    Lipid flowsheet reviewed?: Yes   Progress toward LDL goal: Unchanged  Hypertension   Last Blood Pressure: 138 / 64  (06/02/2010)   Serum creatinine: 0.71  (09/05/2009)   Serum potassium 3.3  (09/05/2009)    Hypertension flowsheet reviewed?: Yes   Progress toward BP goal: At goal  Self-Management Support :   Personal Goals (by the next clinic visit) :      Personal blood pressure goal: 140/90  (02/09/2010)     Personal LDL goal: 100  (06/02/2010)    Hypertension self-management support: Written self-care plan  (02/09/2010)    Lipid self-management support: Not documented    Nursing Instructions: Schedule screening DXA bone density scan (see order)

## 2010-12-01 NOTE — Letter (Signed)
Summary: Med clearance for hip surgery  [facility]  [address]   [place], [place] [address]   Phone: [phone]  Fax: [phone]    07/25/2009  [facility] [address] [place], [place] [address] [phone]  RE: Linda Macias DOB: February 07, 1923 [address] [place], [place]  [address]  To whom it may concern:  This letter is in reference to Ms. Linda Macias.  I've evaluated her risks for right total hip arthroplasty.  Given her age and medical conditions she is at moderate risk for this surgery.    Please let me know if I can be of further assistance.   Sincerely,    Leianna Barga MD

## 2010-12-01 NOTE — Consult Note (Signed)
Summary: [facility]   Imported By: De Nurse 09/30/2009 16:44:15  _____________________________________________________________________  External Attachment:    Type:   Image     Comment:   External Document

## 2010-12-01 NOTE — Consult Note (Signed)
Summary: [facility]: repeat c-scope in 6 mos colonic adenoma  [facility]   Imported By: De Nurse 10/08/2009 11:52:33  _____________________________________________________________________  External Attachment:    Type:   Image     Comment:   External Document  Appended Document: [facility]: repeat c-scope in 6 mos colonic adenoma    Clinical Lists Changes  Observations: Added new observation of PAST MED HX: Did not tolerate statins - refuses to take now osteoarthritis in legs no residuals from CVA -[facility], Dr Matthias Hughs, 02/12/2009: Recurrent prox ascend polyp; multiple fragments of tubulovillous adenoma.  No high grade dysplasia or malignancy identified.  Repeat in 1 yr. -C-scope 12/22010 by Dr Matthias Hughs showed large recurring sessile adenoma in proximal colon without high-grad dysplasia.  Prox colon resection vs medical management.  Pt chose medical mngt for now.  Repeat c-scope in 6 months  (10/09/2009 12:13) Added new observation of PRIMARY MD: Klaira Pesci MD (10/09/2009 12:13)        Past History:  Past Medical History: Did not tolerate statins - refuses to take now osteoarthritis in legs no residuals from CVA -[facility], Dr Matthias Hughs, 02/12/2009: Recurrent prox ascend polyp; multiple fragments of tubulovillous adenoma.  No high grade dysplasia or malignancy identified.  Repeat in 1 yr. -C-scope 12/22010 by Dr Matthias Hughs showed large recurring sessile adenoma in proximal colon without high-grad dysplasia.  Prox colon resection vs medical management.  Pt chose medical mngt for now.  Repeat c-scope in 6 months

## 2010-12-01 NOTE — Assessment & Plan Note (Signed)
Summary: BP CK/KH  Nurse Visit   Vital Signs:  Patient profile:   77 year old female Pulse rate:   60 / minute BP sitting:   130 / 76  (left arm)  Vitals Entered By: Theresia Lo RN (July 16, 2009 11:19 AM)   patient waited 10 minutes minutes before BP check.  Theresia Lo RN  July 16, 2009 11:20 AM   Allergies: No Known Drug Allergies  Orders Added: 1)  No Charge Patient Arrived ([id number]) [[id number]]

## 2010-12-01 NOTE — Assessment & Plan Note (Signed)
Summary: f/up,tcb   Vital Signs:  Patient profile:   77 year old female Height:      63.25 inches Weight:      170.6 pounds Temp:     98.0 degrees F oral Pulse rate:   64 / minute BP sitting:   174 / 82  (right arm) Cuff size:   regular  Vitals Entered By: Garen Grams LPN (February 09, 2010 11:46 AM)  Serial Vital Signs/Assessments:  Time      Position  BP       Pulse  Resp  Temp     By                     148/90                         Joaquina Nissen MD  CC: f/u bp, Lipid Management, Hypertension Management Is Patient Diabetic? No Pain Assessment Patient in pain? no        Primary Care Provider:  Aristotle Lieb MD  CC:  f/u bp, Lipid Management, and Hypertension Management.  History of Present Illness: 77 y/o F here for f/u of the BP  HLD: Refuses statin  HYPERTENSION  Disease Monitoring: checks at [employer] 140s/70s, never higher Medications: atenolol-Chlorthalidone 50/25mg  daily Compliance: yes  Lightheadedness: no Edema: no  Chest pain: no  Dyspnea:no  Prevention Exercise: walking everyday at least 10-20 min daily, PT exercises Salt restriction: yes  Anemia: not taking Fe.  Eating beans, collard greens, brocoli, bananas    Hypertension History:      Positive major cardiovascular risk factors include female age 14 years old or older, hyperlipidemia, and hypertension.  Negative major cardiovascular risk factors include non-tobacco-user status.    Lipid Management History:      Positive NCEP/ATP III risk factors include female age 61 years old or older and hypertension.  Negative NCEP/ATP III risk factors include non-tobacco-user status.      Habits & Providers  Alcohol-Tobacco-Diet     Alcohol drinks/day: 0     Tobacco Status: never  Exercise-Depression-Behavior     Does Patient Exercise: yes     Exercise Counseling: not indicated; exercise is adequate     Type of exercise: walking, physical therapy exercises     Exercise (avg: min/session): <30     Times/week:  7  Current Medications (verified): 1)  Atenolol-Chlorthalidone 50-25 Mg Tabs (Atenolol-Chlorthalidone) .... Take 1 Tablet By Mouth Once A Day 2)  Clarinex 5 Mg Tabs (Desloratadine) .... One Tablet By Mouth Daily As Needed Sinus Symptoms/allergy Symptoms  Allergies (verified): No Known Drug Allergies  Past History:  Past Medical History: Last updated: 10/09/2009 Did not tolerate statins - refuses to take now osteoarthritis in legs no residuals from CVA -[facility], Dr Matthias Hughs, 02/12/2009: Recurrent prox ascend polyp; multiple fragments of tubulovillous adenoma.  No high grade dysplasia or malignancy identified.  Repeat in 1 yr. -C-scope 12/22010 by Dr Matthias Hughs showed large recurring sessile adenoma in proximal colon without high-grad dysplasia.  Prox colon resection vs medical management.  Pt chose medical mngt for now.  Repeat c-scope in 6 months  Family History: Last updated: 09/06/08 Sis died 75yo colon CA bro died 33yo heart dz Dad died at 34yo of heart dz, Etoh, tob. Mom died at 80yo of CAD Sister alive at 39yo with HTN, DM Gr`m with DM Two sisters died 74 esop cancer, 12 yo of stomach Ca  Social History: Last updated: 02/09/2010  Lives alone in [place]. Divorced & widowed.  No tob, etoh, drugs.  +exercise (30 min/d). Good diet.  Works as Manufacturing engineer.   Daughter is Charity fundraiser at [employer].   2 grandchildren, great-grandchildren.; ENJOYS FISHING for catfish, SEWING.  Stefanie Libel (daughter) home phone [phone], cell [phone]  Code Status: DNR/DNI  Risk Factors: Alcohol Use: 0 (02/09/2010) Exercise: yes (02/09/2010)  Risk Factors: Smoking Status: never (02/09/2010)  Past Surgical History: R hip replacement 09/2009 by Dr Thurston Hole Cataract surgery o.u. colonic polyps 7/99, 12/01, 09/2003 colonoscopy-polypectomy - 10/19/2000 hysterectomy and BSO `68- NO PAPS NEEDED polypectomy 3/00--adenomatous, no hi gr. Dys repeat polypectomy 9/00--adenomatous  Social  History: Lives alone in [place]. Divorced & widowed.  No tob, etoh, drugs.  +exercise (30 min/d). Good diet.  Works as Manufacturing engineer.   Daughter is Charity fundraiser at [employer].   2 grandchildren, great-grandchildren.; ENJOYS FISHING for catfish, SEWING.  Stefanie Libel (daughter) home phone [phone], cell [phone]  Code Status: DNR/DNIDoes Patient Exercise:  yes  Review of Systems       per hpi  Physical Exam  General:  Well-developed,well-nourished,in no acute distress; alert,appropriate and cooperative throughout examination Lungs:  Normal respiratory effort, chest expands symmetrically. Lungs are clear to auscultation, no crackles or wheezes. Heart:  Normal rate and regular rhythm. S1 and S2 normal without gallop, murmur, click, rub or other extra sounds. Abdomen:  Bowel sounds positive,abdomen soft and non-tender without masses, organomegaly or hernias noted. Pulses:  R and L carotid,radial,femoral,dorsalis pedis and posterior tibial pulses are full and equal bilaterally Extremities:  No clubbing, cyanosis, edema, or deformity noted with normal full range of motion of all joints.   Neurologic:  No cranial nerve deficits noted. Station and gait are normal. Plantar reflexes are down-going bilaterally. DTRs are symmetrical throughout. Sensory, motor and coordinative functions appear intact. Skin:  Intact without suspicious lesions or rashes Psych:  Oriented X3 and memory intact for recent and remote.     Impression & Recommendations:  Problem # 1:  HYPERTENSION, BENIGN SYSTEMIC (ICD-401.1) Recheck BP was 149/90.  Will not make changes to BP meds at this time.  In the past Pt has been normotensive on treatment.  Asked to to rtc for nurse visit to recheck BP in the next 1-2 wks.  Pt to see me in 3 mos.   Her updated medication list for this problem includes:    Atenolol-chlorthalidone 50-25 Mg Tabs (Atenolol-chlorthalidone) .Marland Kitchen... Take 1 tablet by mouth once a day  Orders: [facility]- Est Level   3 ([address])  Problem # 2:  HIP JOINT REPLACEMENT BY OTHER MEANS (ICD-V43.64) Assessment: Comment Only Nov 2010 by Dr Thurston Hole  Problem # 3:  HYPERCHOLESTEROLEMIA (ICD-272.0) Assessment: Comment Only Pt refuses to take statin.  Discussed at length.  She states that she has read the medical insert on side effects and would "rather take my chances with heart attack/"  Problem # 4:  COLON POLYP (ICD-211.3) Assessment: Comment Only Followed by Dr Marjorie Smolder.  Next appt 02/25/10 for colonoscopy.   Complete Medication List: 1)  Atenolol-chlorthalidone 50-25 Mg Tabs (Atenolol-chlorthalidone) .... Take 1 tablet by mouth once a day 2)  Clarinex 5 Mg Tabs (Desloratadine) .... One tablet by mouth daily as needed sinus symptoms/allergy symptoms  Other Orders: Dexa scan (Dexa scan)  Hypertension Assessment/Plan:      The patient's hypertensive risk group is category B: At least one risk factor (excluding diabetes) with no target organ damage.  Her calculated 10 year risk of coronary heart disease is 20 %.  Today's blood pressure is 174/82.    Lipid Assessment/Plan:      Based on NCEP/ATP III, the patient's risk factor category is "0-1 risk factors".  The patient's lipid goals are as follows: Total cholesterol goal is 200; LDL cholesterol goal is 130; HDL cholesterol goal is 40; Triglyceride goal is 150.  Her LDL cholesterol goal has not been met.  She has been counseled on adjunctive measures for lowering her cholesterol and has been provided with dietary instructions.    Patient Instructions: 1)  Please schedule a follow-up appointment in 1-2 weeks with Nurse for blood pressure check.  2)  Please schedule a follow-up appointment in 3 months for blood pressure.    Prevention & Chronic Care Immunizations   Influenza vaccine: Not documented   Influenza vaccine due: Not Indicated    Tetanus booster: 08/01/2000: Done.   Tetanus booster due: 08/01/2010    Pneumococcal vaccine: Done.  (08/01/2000)    Pneumococcal vaccine due: None    H. zoster vaccine: Not documented  Colorectal Screening   Hemoccult: Done.  (09/02/2003)   Hemoccult due: Not Indicated    Colonoscopy: Recurrent proximal ascending polyp; mulltiple fragments of tubulovillous adenoma.  No high grade dysplasia or malignancy identified.  Per Dr Matthias Hughs.  Repeat in 1 yr.  (02/12/2009)   Colonoscopy due: 02/12/2010  Other Screening   Pap smear: Done.  (10/02/2003)   Pap smear due: Not Indicated    Mammogram: ASSESSMENT: Negative - BI-RADS 1^MM DIGITAL SCREENING  (10/22/2009)   Mammogram due: 10/22/2008    DXA bone density scan: Done.  (04/01/2004)   DXA bone density action/deferral: Ordered  (02/09/2010)   DXA scan due: None    Smoking status: never  (02/09/2010)  Lipids   Total Cholesterol: 292  (06/23/2009)   Lipid panel action/deferral: Refused   LDL: 223  (06/23/2009)   LDL Direct: Not documented   HDL: 56  (06/23/2009)   Triglycerides: 66  (06/23/2009)    SGOT (AST): 15  (06/23/2009)   SGPT (ALT): 16  (06/23/2009)   Alkaline phosphatase: 74  (06/23/2009)   Total bilirubin: 0.4  (06/23/2009)    Lipid flowsheet reviewed?: Yes   Progress toward LDL goal: Unchanged  Hypertension   Last Blood Pressure: 174 / 82  (02/09/2010)   Serum creatinine: 0.71  (09/05/2009)   Serum potassium 3.3  (09/05/2009)    Hypertension flowsheet reviewed?: Yes   Progress toward BP goal: Deteriorated  Self-Management Support :   Personal Goals (by the next clinic visit) :      Personal blood pressure goal: 140/90  (02/09/2010)     Personal LDL goal: 100  (02/09/2010)    Hypertension self-management support: Written self-care plan  (02/09/2010)   Hypertension self-care plan printed.    Lipid self-management support: Not documented    Nursing Instructions: Schedule screening DXA bone density scan (see order)

## 2010-12-01 NOTE — Progress Notes (Signed)
Summary: needs PA  Phone Note From Other Clinic Call back at [phone]   Caller: [facility] Summary of Call: needs pre- approval for her Persantine Stress test. Initial call taken by: De Nurse,  June 30, 2009 9:37 AM  Follow-up for Phone Call        Called but office is closed.  Will call for CPT code in am. Follow-up by: Modesta Messing LPN,  June 30, 2009 5:09 PM  Additional Follow-up for Phone Call Additional follow up Details #1::        Referral faxed to insurance co. for prior auth. (second time faxed). Additional Follow-up by: Modesta Messing LPN,  July 01, 2009 11:22 AM

## 2010-12-01 NOTE — Assessment & Plan Note (Signed)
Summary: surgical clearance exam,tcb   Vital Signs:  Patient profile:   77 year old female Height:      63.25 inches Weight:      162.5 pounds Pulse rate:   74 / minute BP sitting:   150 / 80  (left arm) Cuff size:   large  Vitals Entered By: Arlyss Repress CMA, (June 23, 2009 9:38 AM) CC: right hip placement 08-04-09.  Is Patient Diabetic? No Pain Assessment Patient in pain? no        Primary Care Provider:  CAT TA MD  CC:  right hip placement 08-04-09. Marland Kitchen  History of Present Illness: Pt is here for medical clearance for right hip replacement surgery with [facility].  Surgery is schedule for 08/04/2009.  Pt was advised to have surgery last year but she has put it off until now.  Pt walks with a cane and places weight on her left hip.  About a month she left extreme pain in her left hip and called [facility] and scheduled an appt to be seen right away and was advised to have surgery.   Pt denies chest pain, shortness of breath, palpitations, cough, HAs.  No history of CAD, angina, MI, CHF, symptomatic arrhythmias, pacemaker.  Pt does have history of CVA in 2003 with no known cause or sequale, no loss of function from CVA.  No history of DM, renal impairment or chronic pulmonary disease. Pt's functional capacity: is between 4-10 METS.  Pt cannot run short distance or do heavy housework.  She can take care of herself, eat, dress, and use the toilet, walk indoors around the house, walk a block or two on ground level at 2-3 mph, do light work around the house, climb a flight of stairs.  HYPERTENSION Disease Monitoring Blood pressure range:140s-150s/70s-80s Chest pain: no  Dyspnea:no Medications: atenolol-chlortaladone 50-25 once daily Compliance: yes Lightheadedness: no  Edema: no Prevention Exercise: very littleSalt restriction: yes  Hyperlipidemia:  Refuses to take statin.  Previous LDL above goal   Habits & Providers  Alcohol-Tobacco-Diet     Tobacco Status:  never  Allergies: No Known Drug Allergies  Physical Exam  General:  Well-developed,well-nourished,in no acute distress; alert,appropriate and cooperative throughout examination. Vitals reviewed. Neck:  supple, full ROM, and no masses.   Lungs:  Normal respiratory effort, chest expands symmetrically. Lungs are clear to auscultation, no crackles or wheezes. Heart:  Normal rate and regular rhythm. S1 and S2 normal without gallop, murmur, click, rub or other extra sounds. Abdomen:  non-tender.   Pulses:  R radial normal and L radial normal.   Extremities:  NO edema Neurologic:  alert & oriented X3.   Cervical Nodes:  No lymphadenopathy noted   Impression & Recommendations:  Problem # 1:  HIP JOINT REPLACEMENT BY OTHER MEANS (ICD-V43.64) Assessment New Will refer pt to [facility] for cardiolyte (chem stress test) to further evaluate heart fxn and pt's perioperative for surgery.  Functional capacity questionaire showed that pt is 4-5 METS.  EKG NSR with vent rate 62 bpm.  Pt has not had hip replacement surgery in the past.  Stated that she had hysterectomy and denied any complications from anesthesia.  Pt is currently on a beta blocker, atenolol-chlordialone 50-25 combo.  Given her age and medical history pt is at intermediate risk for this surgery.  Orders: Cardiolite (Cardiolite) [facility]- Est  Level 4 (29924)  Problem # 2:  HYPERTENSION, BENIGN SYSTEMIC (ICD-401.1) Assessment: Unchanged BP not at goal, but this seems to be  her baseline.  Given that pt will be having R hip replacement surgery, ?need to increase dose of med.  Pt to rtc in one week for nurse visit to check BP.  Will make changes to med at that time. Her updated medication list for this problem includes:    Atenolol-chlorthalidone 50-25 Mg Tabs (Atenolol-chlorthalidone) .Marland Kitchen... Take 1 tablet by mouth once a day  Orders: 12 Lead EKG (12 Lead EKG) Comp Met-[facility] ([id number]) CBC-[facility] (09811) Lipid-[facility] (91478-29562) Cardiolite  (Cardiolite) [facility]- Est  Level 4 (99214)  Problem # 3:  HYPERCHOLESTEROLEMIA (ICD-272.0) Previous  FLP not at goal.  Pt refuses to take the statin that was prescribed to her, stating that she "God is giving me 8 more years."  Given pt's risk factors: family hx (mother and father with CAD and Heart D), hypertension, past history of CVA, pt would benefit from statin for 2nd prevention.  Discussed the risks for mortality with patient.   The following medications were removed from the medication list:    Simvastatin 20 Mg Tabs (Simvastatin) ..... One tablet by mouth at bedtime  Orders: 12 Lead EKG (12 Lead EKG) Comp Met-[facility] ([id number]) Lipid-[facility] (96295-28413) [facility]- Est  Level 4 (24401)  Problem # 4:  CVA (ICD-436) Assessment: Unchanged CVA 2003/2004.  Pt had no sequale from CVA.  No known cause for CVA. Orders: CBC-[facility] (02725) [facility]- Est  Level 4 (36644)  Problem # 5:  COLON POLYP (ICD-211.3) Assessment: Unchanged Colonoscopy in 01/2009 showed colon polyp.  Will need to repeat c-scope in 01/2010.  NO unintentional weight loss or bloody stools. Orders: [facility]- Est  Level 4 (03474)  Complete Medication List: 1)  Atenolol-chlorthalidone 50-25 Mg Tabs (Atenolol-chlorthalidone) .... Take 1 tablet by mouth once a day 2)  Tylenol Ex St Arthritis Pain 500 Mg Tabs (Acetaminophen) .Marland Kitchen.. 1 tab every 4-6 hours as needed for pain 3)  Voltaren 1 % Gel (Diclofenac sodium) .... Apply to r knee twice daily as needed for pain.  disp 1 large container 4)  Ibu 400 Mg Tabs (Ibuprofen) .Marland Kitchen.. 1 tablet by mouth every 6 hour as needed for pain 5)  Clarinex 5 Mg Tabs (Desloratadine) .... One tablet by mouth daily as needed sinus symptoms/allergy symptoms 6)  Adult Aspirin Ec Low Strength 81 Mg Tbec (Aspirin) .Marland Kitchen.. 1 tablet by mouth daily  Patient Instructions: 1)  Please schedule a follow-up appointment in 1 week for nurse visit for recheck of BP.   I will call you if I need to make changes to your blood pressure  medication. 2)  Please schedule an appointment to see me a few weeks after your hip surgery.   Prevention & Chronic Care Immunizations   Influenza vaccine: Not documented   Influenza vaccine due: Not Indicated    Tetanus booster: 08/01/2000: Done.   Tetanus booster due: 08/01/2010    Pneumococcal vaccine: Done.  (08/01/2000)   Pneumococcal vaccine due: None    H. zoster vaccine: Not documented  Colorectal Screening   Hemoccult: Done.  (09/02/2003)   Hemoccult due: Not Indicated    Colonoscopy: Recurrent proximal ascending polyp; mulltiple fragments of tubulovillous adenoma.  No high grade dysplasia or malignancy identified.  Per Dr Matthias Hughs.  Repeat in 1 yr.  (02/12/2009)   Colonoscopy due: 02/12/2010  Other Screening   Pap smear: Done.  (10/02/2003)   Pap smear due: Not Indicated    Mammogram: ASSESSMENT: Negative - BI-RADS 1^MM DIGITAL SCREENING  (10/21/2008)   Mammogram due: 10/22/2008    DXA bone density scan:  Done.  (04/01/2004)   DXA scan due: None    Smoking status: never  (06/23/2009)  Lipids   Total Cholesterol: 260  (04/23/2008)   LDL: 198  (04/23/2008)   LDL Direct: Not documented   HDL: 43  (04/23/2008)   Triglycerides: 95  (04/23/2008)    SGOT (AST): 14  (04/23/2008)   SGPT (ALT): 10  (04/23/2008) CMP ordered    Alkaline phosphatase: 87  (04/23/2008)   Total bilirubin: 0.7  (04/23/2008)    Lipid flowsheet reviewed?: Yes   Progress toward LDL goal: Unchanged  Hypertension   Last Blood Pressure: 150 / 80  (06/23/2009)   Serum creatinine: 0.72  (04/23/2008)   Serum potassium 3.6  (04/23/2008) CMP ordered     Hypertension flowsheet reviewed?: Yes   Progress toward BP goal: Unchanged  Self-Management Support :    Hypertension self-management support: Not documented    Lipid self-management support: Not documented    Medication Administration  Injection # 1:    Medication: Depo-Provera 150mg     Diagnosis: CONTRACEPTIVE MANAGEMENT  (ICD-V25.09)    Route: IM    Site: RUOQ gluteus    Exp Date: 08/02/2011    Lot #: [id number]    Mfr: Francisca December    Comments: Pt advised to RTC for next Depo between 09/08/09 and 09/22/2009    Patient tolerated injection without complications    Given by: Modesta Messing LPN (June 23, 2009 11:24 AM)  Orders Added: 1)  12 Lead EKG [12 Lead EKG] 2)  Comp Met-[facility] [[id number]] 3)  CBC-[facility] [85027] 4)  Lipid-[facility] [80061-22930] 5)  Cardiolite [Cardiolite] 6)  [facility]- Est  Level 4 [14782]  Appended Document: surgical clearance exam,tcb Depo injection incorrectly entered on this chart.  Please disregard.

## 2010-12-01 NOTE — Assessment & Plan Note (Signed)
Summary: routine follow up for hypertension/ls   Vital Signs:  Patient Profile:   77 Years Old Female Height:     63.25 inches Weight:      171.2 pounds Temp:     97.8 degrees F Pulse rate:   76 / minute BP sitting:   142 / 76  (left arm)  Pt. in pain?   no  Vitals Entered By: Alphia Kava (April 23, 2008 2:21 PM)              Is Patient Diabetic? No     Chief Complaint:  f/u BP.  History of Present Illness: 77 yo woman w/ multiple medical issues who historically has refused treatment or medication changes RTC today for 1) HTN- Pt's BP not bad.  Taking Tenoritic daily.  Denies CP, SOB, N/V, HAs, visual changes.  Not interested in changes meds at this time to improve control.  2) Elevated cholesterol- Pt's cholesterol has been high in the past but pt has never been willing to make dietary changes or take meds.  Not interested at this time in hearing risks of elevated cholesterol nor the benefits of controlling it.  3) Arthritis- pt w/ arthritis in neck and shoulders, stated ibuprofen helped but caused her to have memory difficulties and her face to break out.  Pt currently taking Tylenol arthritis and this suits her fine.  Pt still able to fish and that is her main goal.    Current Allergies: No known allergies   Past Medical History:    Did not tolerate statins - refuses to take now    mild osteoarthritis in legs    no residuals from CVA   Social History:    Reviewed history from 12/29/2006 and no changes required:       Moved from [facility] 8/99.Lives alone in [place]. Divorced & widowed. No tob, etoh, drugs.  +exercise (30 min/d). Good diet.  Works as Manufacturing engineer.  Daughter is Charity fundraiser at [employer].  2 grandchildren, no great-grandchildren.; ENJOYS FISHING for catfish, [place].    Review of Systems      See HPI   Physical Exam  General:     Well-developed,well-nourished,in no acute distress; alert,appropriate and cooperative throughout examination Head:  Normocephalic and atraumatic without obvious abnormalities. No apparent alopecia or balding. Eyes:     No corneal or conjunctival inflammation noted. EOMI. Perrla.  Neck:     No deformities, masses, or tenderness noted. Lungs:     Normal respiratory effort, chest expands symmetrically. Lungs are clear to auscultation, no crackles or wheezes. Heart:     Normal rate and regular rhythm. S1 and S2 normal without gallop, murmur, click, rub or other extra sounds. Abdomen:     obese, soft, NT/ND, +BS Msk:     legs - ankle and patellar reflex intact,  normal gait.  normal hip rom.  str at hips, knee 5/5 bilaterally.   arms- good ROM of shoulders bilaterally, mild pain w/ forward flexion, abduction, and overhead movements Pulses:     +2 DP and radial pulses Extremities:     No clubbing, cyanosis, edema    Impression & Recommendations:  Problem # 1:  HYPERTENSION, BENIGN SYSTEMIC (ICD-401.1) Assessment: Unchanged Pt's BP close to goal and pt not willing at this time to make medication changes.  Will continue to follow pt Q6 months as this is what we have agreed to. Her updated medication list for this problem includes:    Atenolol-chlorthalidone 50-25 Mg Tabs (Atenolol-chlorthalidone) .Marland Kitchen... Take  1 tablet by mouth once a day  Orders: Comp Met-[facility] ([id number]) [facility]- Est  Level 4 (21308)   Problem # 2:  HYPERCHOLESTEROLEMIA (ICD-272.0) Assessment: Unchanged Pt not willing to take statins or make dietary changes but likes to know what her #s are.  Will check FLP today to assess.  Pt not likely to change her mind on meds but this is her right. Orders: Comp Met-[facility] [phone]) Lipid-[facility] (52841-32440) [facility]- Est  Level 4 (10272)   Problem # 3:  DEGENERATIVE JOINT DISEASE (ICD-715.90) Assessment: Unchanged Pt w/ arthritis in neck and shoulders.  Not limited in motion or by pain.  Continues to fish as she loves to do.  Script given for Tylenol Arthritis to help w/ costs. The following  medications were removed from the medication list:    Ibuprofen 400 Mg Tabs (Ibuprofen) .Marland Kitchen... 1 tab by mouth q 6 hours as needed for pain    Lortab 5 5-500 Mg Tabs (Hydrocodone-acetaminophen) .Marland Kitchen... 1 poq6 as needed  Her updated medication list for this problem includes:    Tylenol Ex St Arthritis Pain 500 Mg Tabs (Acetaminophen) .Marland Kitchen... 1 tab every 4-6 hours as needed for pain  Orders: [facility]- Est  Level 4 (53664)   Complete Medication List: 1)  Atenolol-chlorthalidone 50-25 Mg Tabs (Atenolol-chlorthalidone) .... Take 1 tablet by mouth once a day 2)  Tylenol Ex St Arthritis Pain 500 Mg Tabs (Acetaminophen) .Marland Kitchen.. 1 tab every 4-6 hours as needed for pain   Patient Instructions: 1)  Please schedule a follow-up appointment in 4-6 weeks for complete physical. 2)  Keep taking your blood pressure medicine- it looks great! 3)  Take the Tylenol arthritis as needed for pain 4)  We will talk about your lab results at your next appointment 5)  Good luck fishing!   Prescriptions: TYLENOL EX ST ARTHRITIS PAIN 500 MG  TABS (ACETAMINOPHEN) 1 tab every 4-6 hours as needed for pain  #100 x 3   Entered and Authorized by:   Neena Rhymes  MD   Signed by:   Neena Rhymes  MD on 04/23/2008   Method used:   Electronically sent to ...       [facility] [address] [phone]*       [address]       [place], [place]  [address]       Ph: [phone] or [phone]       Fax: [phone]   RxID:   [id number] ATENOLOL-CHLORTHALIDONE 50-25 MG TABS (ATENOLOL-CHLORTHALIDONE) Take 1 tablet by mouth once a day  #30 Tablet x 3   Entered and Authorized by:   Neena Rhymes  MD   Signed by:   Neena Rhymes  MD on 04/23/2008   Method used:   Electronically sent to ...       [facility] [address] [address] [phone]*       [address]       [place], [place]  [address]       Ph: [phone] or [phone]       Fax: [phone]    RxID:   [id number]  ]

## 2010-12-24 ENCOUNTER — Other Ambulatory Visit: Payer: Self-pay | Admitting: Family Medicine

## 2011-01-11 ENCOUNTER — Ambulatory Visit (INDEPENDENT_AMBULATORY_CARE_PROVIDER_SITE_OTHER): Payer: Medicare Other | Admitting: Family Medicine

## 2011-01-11 ENCOUNTER — Encounter: Payer: Self-pay | Admitting: Family Medicine

## 2011-01-11 VITALS — BP 136/76 | Temp 98.4°F | Wt 173.0 lb

## 2011-01-11 DIAGNOSIS — N949 Unspecified condition associated with female genital organs and menstrual cycle: Secondary | ICD-10-CM

## 2011-01-11 DIAGNOSIS — R102 Pelvic and perineal pain: Secondary | ICD-10-CM | POA: Insufficient documentation

## 2011-01-11 LAB — POCT URINALYSIS DIPSTICK
Bilirubin, UA: NEGATIVE
Glucose, UA: NEGATIVE
Ketones, UA: NEGATIVE
Nitrite, UA: POSITIVE
Protein, UA: NEGATIVE
Spec Grav, UA: 1.025
Urobilinogen, UA: 0.2
pH, UA: 5.5

## 2011-01-11 MED ORDER — CEPHALEXIN 500 MG PO CAPS: 500.0000 mg | ORAL_CAPSULE | Freq: Two times a day (BID) | ORAL | Status: AC

## 2011-01-11 MED ORDER — ESTROGENS, CONJUGATED 0.625 MG/GM VA CREA: 0.5000 g | TOPICAL_CREAM | Freq: Every day | VAGINAL | Status: AC

## 2011-01-11 NOTE — Progress Notes (Signed)
  Subjective:    Patient ID: Linda Macias, female    DOB: [birth year], 77 y.o.   MRN: [id number]  HPI Vaginal growth:  Present for 4-5 yrs.  Now pt noticed that after hip replacement surgery 08/04/09 she has been noticing a vaginal itch.  Some burning sensation on labia when she voids.  She has been using Oil of Olay soap for many years.  Wears underwear with cotton bottom. History of bladder infection years ago.    Review of Systems No fever, chills, cough, vaginal bleeding, frequency, abd pain, constipation, diarrhea, vaginal discharge.  Not sexually active    Objective:   Physical Exam General:  Well-developed,well-nourished,in no acute distress; alert,appropriate and cooperative throughout examination. vitals reviewed.  Head:  normocephalic and atraumatic.  Abd: soft, NT/ND, +BS, no masses  GYN: shiny skin on labia majora, no irritation, skin slightly pink in color.  no vaginal discharge, no vaginal bleeding. There is a 1/2 cm round nodule that feels cystic deep to R labial majora, nontender, no discharge, no pustule, no erythema, no fluctualance.  No adnexal mass or tenderness.        Assessment & Plan:

## 2011-01-11 NOTE — Assessment & Plan Note (Signed)
UA showed trace blood, positive nitrite, small leuk.  Will treat with Keflex 500mg  bid x 7 days. May also be related to vaginal atrophy.  Will prescribe premarin 0.625mg  cream for twice a week use.  Pt to rtc in 4 wks if not better.

## 2011-01-11 NOTE — Patient Instructions (Addendum)
Your pain may be from dryness to the vaginal area due to lack of estrogen or a bladder infection. For bladder infection: take keflex twice a day for 7 days.   I have prescribed Premarin, which is a vaginal cream, that you can use twice a week.   Apply this two times per week. Please come back in 4 wks if not better.

## 2011-02-02 ENCOUNTER — Encounter: Payer: Self-pay | Admitting: Home Health Services

## 2011-02-03 ENCOUNTER — Other Ambulatory Visit: Payer: Self-pay | Admitting: Family Medicine

## 2011-02-03 NOTE — Telephone Encounter (Signed)
Refill request

## 2011-02-04 LAB — CBC
HCT: 24.9 % — ABNORMAL LOW (ref 36.0–46.0)
HCT: 27.6 % — ABNORMAL LOW (ref 36.0–46.0)
HCT: 28 % — ABNORMAL LOW (ref 36.0–46.0)
Hemoglobin: 8.2 g/dL — ABNORMAL LOW (ref 12.0–15.0)
Hemoglobin: 9 g/dL — ABNORMAL LOW (ref 12.0–15.0)
Hemoglobin: 9.4 g/dL — ABNORMAL LOW (ref 12.0–15.0)
MCHC: 32.5 g/dL (ref 30.0–36.0)
MCHC: 32.8 g/dL (ref 30.0–36.0)
MCHC: 33.6 g/dL (ref 30.0–36.0)
MCV: 76.2 fL — ABNORMAL LOW (ref 78.0–100.0)
MCV: 76.4 fL — ABNORMAL LOW (ref 78.0–100.0)
MCV: 76.5 fL — ABNORMAL LOW (ref 78.0–100.0)
Platelets: 133 10*3/uL — ABNORMAL LOW (ref 150–400)
Platelets: 144 10*3/uL — ABNORMAL LOW (ref 150–400)
Platelets: 149 10*3/uL — ABNORMAL LOW (ref 150–400)
RBC: 3.26 MIL/uL — ABNORMAL LOW (ref 3.87–5.11)
RBC: 3.62 MIL/uL — ABNORMAL LOW (ref 3.87–5.11)
RBC: 3.66 MIL/uL — ABNORMAL LOW (ref 3.87–5.11)
RDW: 15.4 % (ref 11.5–15.5)
RDW: 15.7 % — ABNORMAL HIGH (ref 11.5–15.5)
RDW: 15.9 % — ABNORMAL HIGH (ref 11.5–15.5)
WBC: 11.6 10*3/uL — ABNORMAL HIGH (ref 4.0–10.5)
WBC: 14.2 10*3/uL — ABNORMAL HIGH (ref 4.0–10.5)
WBC: 8.5 10*3/uL (ref 4.0–10.5)

## 2011-02-04 LAB — BASIC METABOLIC PANEL
BUN: 10 mg/dL (ref 6–23)
BUN: 10 mg/dL (ref 6–23)
BUN: 17 mg/dL (ref 6–23)
CO2: 28 mEq/L (ref 19–32)
CO2: 29 mEq/L (ref 19–32)
CO2: 29 mEq/L (ref 19–32)
Calcium: 7.7 mg/dL — ABNORMAL LOW (ref 8.4–10.5)
Calcium: 8.1 mg/dL — ABNORMAL LOW (ref 8.4–10.5)
Calcium: 8.4 mg/dL (ref 8.4–10.5)
Chloride: 101 mEq/L (ref 96–112)
Chloride: 96 mEq/L (ref 96–112)
Chloride: 96 mEq/L (ref 96–112)
Creatinine, Ser: 0.7 mg/dL (ref 0.4–1.2)
Creatinine, Ser: 0.72 mg/dL (ref 0.4–1.2)
Creatinine, Ser: 0.82 mg/dL (ref 0.4–1.2)
GFR calc Af Amer: >60 mL/min (ref >60)
GFR calc Af Amer: >60 mL/min (ref >60)
GFR calc Af Amer: >60 mL/min (ref >60)
GFR calc non Af Amer: >60 mL/min (ref >60)
GFR calc non Af Amer: >60 mL/min (ref >60)
GFR calc non Af Amer: >60 mL/min (ref >60)
Glucose, Bld: 122 mg/dL — ABNORMAL HIGH (ref 70–99)
Glucose, Bld: 177 mg/dL — ABNORMAL HIGH (ref 70–99)
Glucose, Bld: 191 mg/dL — ABNORMAL HIGH (ref 70–99)
Potassium: 2.7 mEq/L — CL (ref 3.5–5.1)
Potassium: 2.9 mEq/L — ABNORMAL LOW (ref 3.5–5.1)
Potassium: 4.8 mEq/L (ref 3.5–5.1)
Sodium: 131 mEq/L — ABNORMAL LOW (ref 135–145)
Sodium: 133 mEq/L — ABNORMAL LOW (ref 135–145)
Sodium: 135 mEq/L (ref 135–145)

## 2011-02-04 LAB — ABO/RH: ABO/RH(D): A POS

## 2011-02-04 LAB — PROTIME-INR
INR: 1.27 (ref 0.00–1.49)
INR: 1.53 — ABNORMAL HIGH (ref 0.00–1.49)
INR: 1.75 — ABNORMAL HIGH (ref 0.00–1.49)
INR: 2 — ABNORMAL HIGH (ref 0.00–1.49)
Prothrombin Time: 15.8 seconds — ABNORMAL HIGH (ref 11.6–15.2)
Prothrombin Time: 18.3 seconds — ABNORMAL HIGH (ref 11.6–15.2)
Prothrombin Time: 20.3 seconds — ABNORMAL HIGH (ref 11.6–15.2)
Prothrombin Time: 22.5 seconds — ABNORMAL HIGH (ref 11.6–15.2)

## 2011-02-04 LAB — TYPE AND SCREEN
ABO/RH(D): A POS
Antibody Screen: NEGATIVE

## 2011-02-04 LAB — CROSSMATCH
ABO/RH(D): A POS
Antibody Screen: NEGATIVE

## 2011-02-05 LAB — DIFFERENTIAL
Basophils Absolute: 0 10*3/uL (ref 0.0–0.1)
Basophils Relative: 0 % (ref 0–1)
Eosinophils Absolute: 0.2 10*3/uL (ref 0.0–0.7)
Eosinophils Relative: 3 % (ref 0–5)
Lymphocytes Relative: 36 % (ref 12–46)
Lymphs Abs: 2 10*3/uL (ref 0.7–4.0)
Monocytes Absolute: 0.5 10*3/uL (ref 0.1–1.0)
Monocytes Relative: 9 % (ref 3–12)
Neutro Abs: 3 10*3/uL (ref 1.7–7.7)
Neutrophils Relative %: 52 % (ref 43–77)

## 2011-02-05 LAB — URINE MICROSCOPIC-ADD ON

## 2011-02-05 LAB — URINALYSIS, ROUTINE W REFLEX MICROSCOPIC
Bilirubin Urine: NEGATIVE
Glucose, UA: NEGATIVE mg/dL
Hgb urine dipstick: NEGATIVE
Ketones, ur: NEGATIVE mg/dL
Nitrite: POSITIVE — AB
Protein, ur: NEGATIVE mg/dL
Specific Gravity, Urine: 1.016 (ref 1.005–1.030)
Urobilinogen, UA: 1 mg/dL (ref 0.0–1.0)
pH: 8 (ref 5.0–8.0)

## 2011-02-05 LAB — COMPREHENSIVE METABOLIC PANEL
ALT: 22 U/L (ref 0–35)
AST: 22 U/L (ref 0–37)
Albumin: 3.6 g/dL (ref 3.5–5.2)
Alkaline Phosphatase: 85 U/L (ref 39–117)
BUN: 13 mg/dL (ref 6–23)
CO2: 30 mEq/L (ref 19–32)
Calcium: 9.3 mg/dL (ref 8.4–10.5)
Chloride: 102 mEq/L (ref 96–112)
Creatinine, Ser: 0.7 mg/dL (ref 0.4–1.2)
GFR calc Af Amer: >60 mL/min (ref >60)
GFR calc non Af Amer: >60 mL/min (ref >60)
Glucose, Bld: 103 mg/dL — ABNORMAL HIGH (ref 70–99)
Potassium: 3.8 mEq/L (ref 3.5–5.1)
Sodium: 139 mEq/L (ref 135–145)
Total Bilirubin: 0.7 mg/dL (ref 0.3–1.2)
Total Protein: 6.7 g/dL (ref 6.0–8.3)

## 2011-02-05 LAB — CBC
HCT: 35.7 % — ABNORMAL LOW (ref 36.0–46.0)
Hemoglobin: 11.7 g/dL — ABNORMAL LOW (ref 12.0–15.0)
MCHC: 32.9 g/dL (ref 30.0–36.0)
MCV: 76.3 fL — ABNORMAL LOW (ref 78.0–100.0)
Platelets: 217 10*3/uL (ref 150–400)
RBC: 4.67 MIL/uL (ref 3.87–5.11)
RDW: 16.1 % — ABNORMAL HIGH (ref 11.5–15.5)
WBC: 5.7 10*3/uL (ref 4.0–10.5)

## 2011-02-05 LAB — APTT: aPTT: 28 seconds (ref 24–37)

## 2011-02-05 LAB — PROTIME-INR
INR: 1 (ref 0.00–1.49)
Prothrombin Time: 13.4 seconds (ref 11.6–15.2)

## 2011-02-05 LAB — URINE CULTURE: Colony Count: >=100000

## 2011-02-08 ENCOUNTER — Telehealth: Payer: Self-pay | Admitting: Family Medicine

## 2011-02-08 NOTE — Telephone Encounter (Signed)
Wanted to let Dr Janalyn Harder know that she is doing real good! - everything is good.  Wanted her to know that the last refill, the pharmacy didn't have the Atenolol 50/25, so they gave her 2 pills, one 50 and one 25.  Her BP is 127/70!  She is fine. Will be bringing cat fish before Dr Janalyn Harder leaves.

## 2011-03-09 ENCOUNTER — Other Ambulatory Visit: Payer: Self-pay | Admitting: Family Medicine

## 2011-03-09 NOTE — Telephone Encounter (Signed)
Refill request

## 2011-03-19 NOTE — Op Note (Signed)
[place]  Patient:    JAZZ, BIDDY Visit Number: [id number] MRN: [id number]          Service Type: END Location: ENDO Attending Physician:  Rich Brave Dictated by:   Florencia Reasons, M.D. Proc. Date: 03/21/02 Admit Date:  03/21/2002   CC:         Roanna Epley, M.D.   Operative Report  PROCEDURE: Colonoscopy with polypectomy and ablation of mucosal lesion.  INDICATION:  This 77 year old female has a history of a persistent, locally recurring tubulovillous adenoma of the area just above the cecum as well as a history of other polyps. The most recent attempt to extirpate this lesion was about a year and a half ago.  FINDINGS:  Small amount of residual polyp tissue above the cecum.  No other polyps were seen.  PROCEDURE: The nature, purpose and risks of the procedure were familiar to the patient from prior examination.  She provided written consent. Sedation was Fentanyl 60 mcg and Versed 6 mg IV without arrhythmias or desaturation. The Olympus adult video colonoscope was readily advanced to the cecum as identified by visualization of the appendiceal orifice and ileocecal valve. Pullback was then performed.  The quality of the prep was very good and it was felt that all areas were adequately seen.  Just above the cecum, tucked in a sulcus, was a small amount of verrucous polyp tissue.  The surface area of this material measured approximately 1.5 x 3.0 cm or so, and parts of it were raised about 5 to 8 mm above the surface.  This area was injected with 1:10,000 epinephrine, approximately 2 cc total, which really did not elevate the lesion but led to slight blanching.  The bulk of the tissue was then snared off and the residual tissue at the base was then treated with the Argon plasma coagulator probe to more completely devitalize any residual tissue.  There was virtually complete hemostasis and no evidence of excessive  cautery from this maneuver.  No other polyps were seen, and there was no evidence of cancer, colitis, vascular malformations or diverticulosis.  Retroflexion in the rectum showed no abnormalities.  The patient tolerated the procedure well and there were no apparent complications. The polyp fragments were retrieved by suctioning through the scope.  IMPRESSION: Recurrent polyp tissue just above the cecum.  PLAN:  Await pathology.  I anticipate colonoscopic follow-up in one year. Dictated by:   Florencia Reasons, M.D. Attending Physician:  Rich Brave DD:  03/21/02 TD:  03/22/02 Job: [id number]

## 2011-03-19 NOTE — Procedures (Signed)
[place]  Patient:    Linda Macias, Linda Macias                        MRN: [id number] Proc. Date: 10/06/00 Adm. Date:  [id number] Attending:  Rich Brave CC:         [name]. Darrick Penna, M.D.   Procedure Report  PROCEDURE:  Colonoscopy with polypectomy.  INDICATION:  Follow-up of recurrent adenomatous polyp in the area just above the patients cecum, which has been removed in a piecemeal fashion, most recently a year ago, at which time it was felt that essentially all tissue had been removed.  FINDINGS:  Moderate amount of residual polyp tissue present, removed by snare technique.  Two additional polyps removed.  DESCRIPTION OF PROCEDURE: DD:  10/06/00 TD:  10/07/00 Job: [id number]

## 2011-03-19 NOTE — Procedures (Signed)
[place]  Patient:    Linda Macias, Linda Macias                        MRN: [id number] Proc. Date: 10/06/00 Adm. Date:  [id number] Attending:  Rich Brave CC:         [name]. Darrick Penna, M.D.   Procedure Report  PROCEDURE:  Colonoscopy with polypectomy.  INDICATIONS:  Followup of recurrent adenomatous polyp above the cecum. The nature, purpose and risks of the procedure were familiar to the patient and she provided written consent.  FINDINGS:  Moderate amount of residual or recurrent polypoid tissue above the cecum, plus two additional medium-sized polyps removed.  SEDATION:  Fentanyl 75 mcg and Versed 7.5 mg IV prior to and during that course of the procedure without arrhythmias or desaturation.  DESCRIPTION OF PROCEDURE:  The Olympus adult video colonoscope was advanced quite easily to the cecum as identified by clear visualization of the appendiceal orifice.  The quality of the prep was very good, and it was felt that all areas were well-seen on this exam.  Three polyps were identified and removed during this exam.  It was felt that essentially all polyp fragments were recovered for histologic analysis.  The first polyp was removed on the way in.  It was a sessile 8 x 14 mm flat polyp on the fold in what I believe was the region of the distal ascending colon.  I injected the base of it with epinephrine to elevate it, and then it was snared off in a single piece and suctioned through the scope.  The next polyp was at the site of previous polypectomy just a short distance above the cecum.  This was a verrucous, roughly 1 x 2 cm polyp recessed inside a haustration just a short distance above the cecum.  Again, this polyp was elevated with several injections of epinephrine and snared off piecemeal, probably in approximately 8 small pieces, of which cautery was probably used for about five of them.  When I was finished, there was minimal, if any,  residual polypoid tissue; there appeared to be a couple of nubbins of polypoid tissue measuring 1 or 2 mm across, but they were in the center of an area that had light eschar around it, so it was anticipated that they would probably slough.  The third polyp was a semipedunculated 5 mm polyp deep inside a haustration in the ascending colon. It was snared off in a single piece and again recovered for histologic analysis.  At all polypectomy sites, there was very good hemostasis and no evidence of excessive cautery.  No cancer, colitis, vascular malformations, or diverticular disease were observed during this exam.  Retroflexion in the rectum was unremarkable.  The patient tolerated the procedure well, and there were no apparent complications.  IMPRESSION:  Colon polyps removed as described above.  PLAN:  Await pathology on the polyps.  Anticipate probable colonoscopic followup in about a year to check for polyp recurrence, especially at the site of recurrence above the cecum. DD:  10/06/00 TD:  10/07/00 Job: [id number]

## 2011-03-19 NOTE — Op Note (Signed)
NAME:  Linda Macias, KAWABATA NO.:  [id number]   MEDICAL RECORD NO.:  [id number]          PATIENT TYPE:  OIB   LOCATION:  2870                         FACILITY:  [facility]   PHYSICIAN:  Mark C. Ophelia Charter, M.D.    DATE OF BIRTH:  12/07/1922   DATE OF PROCEDURE:  05/12/2005  DATE OF DISCHARGE:  05/12/2005                                 OPERATIVE REPORT   PREOPERATIVE DIAGNOSIS:  Right index finger, foreign body, retained fish  scale.   POSTOPERATIVE DIAGNOSIS:  Right index finger radial digital nerve partial  laceration.   OPERATION PERFORMED:  Right index finger exploration for foreign body,  radial digital nerve neurolysis.   SURGEON:  Mark C. Ophelia Charter, M.D.   ANESTHESIA:  Digital block plus sedation.   INDICATIONS FOR PROCEDURE:  The patient is a 77 year old female [place], was cleaning pogie fish and a piece of fish  scale stuck her right index thumb and finger and broke off.  She grabbed  hold of the fish scale, removed it and thought that it had broken off and  she did not remove it entirely. When she palpated the area she still felt  sharp pain.  It was explored by a local medical doctor who called me after a  negative exploration.  X-rays were negative for foreign body.  She did have  continued extreme tenderness with palpation, feeling sharp pain which she  described as a sharp sticking type radiating pain.   DESCRIPTION OF PROCEDURE:  After digital block by anesthesia service, the  hand was prepped with DuraPrep.  Ancef was given preoperatively. The stab  incision was directly over the radial neurovascular bundle of the index  finger at the proximal phalanx level midway between the middle and proximal  finger crease.  Oblique incision was made from the middle of the proximal  finger crease.  Subcutaneous tissue was bluntly dissected with Andria Meuse  scissors and direct inspection of the area revealed no subcutaneous foreign  body.  A  small amount of hemorrhage and some early fibrosis was present.  Flexor tendon sheath was entirely intact.  Incision was extended distally in  a Z-type fashion and flap was elevated using 4-0 nylon corner suture.  The  radial digital nerve was identified proximally, then followed distally into  the field.  There was a small area where it had been poked or raked.  The  nerve did not require any suturing, but it was obvious that it had been  damaged either from trauma from the scale going in or more likely when the  patient removed the fish scale.  Radial digital artery was normal.  A small  piece of Penrose then used for a digital tourniquet. Palpation of the area  did not reveal any foreign body masses.  Continued exploration down to  normal tissue at the level of the flexor sheath was performed.  Inspection  behind the digital nerve was performed but the only abnormality was then  small nick in the radial digital nerve.  The patient probably was palpating  the  area where she had nicked the nerve feeling sharp pain and felt that  this was retained foreign body.  After irrigation with  saline solution, the digital tourniquet was removed. The operative field was  dry.  Skin sutures with 4-0 nylon were placed and Xeroform, 4 x 4s and small  hand dressing, index and long finger together was performed.  Xeroform then  applied.  The patient was then transferred to the recovery room in stable  condition.       MCY/MEDQ  D:  05/12/2005  T:  05/13/2005  Job:  [id number]

## 2011-03-19 NOTE — Op Note (Signed)
   NAME:  Linda Macias, Linda Macias                         ACCOUNT NO.:  [id number]   MEDICAL RECORD NO.:  [id number]                   PATIENT TYPE:  AMB   LOCATION:  ENDO                                 FACILITY:  [facility]   PHYSICIAN:  Bernette Redbird, M.D.                DATE OF BIRTH:  08-Apr-1923   DATE OF PROCEDURE:  09/10/2003  DATE OF DISCHARGE:                                 OPERATIVE REPORT   PROCEDURE PERFORMED:  Colonoscopy with polypectomy.   ENDOSCOPIST:  Florencia Reasons, M.D.   INDICATIONS FOR PROCEDURE:  The patient is a 77 year old female with history  of recurring tubulovillous adenoma above the cecum, last colonoscoped a year  ago at which time a small amount of residual tissue was found and removed.   FINDINGS:  A 4 x 8 mm polyp in the general vicinity of the previous polyp  snared off.  Otherwise normal.   DESCRIPTION OF PROCEDURE:  The nature, purpose and risks of the procedure  were familiar to the patient from prior examination and she provided written  consent.  Sedation was fentanyl 60 mcg and Versed 6 mg IV without  arrhythmias or desaturation.   The Olympus adult video colonoscope was readily advanced to the cecum as  identified by clear visualization of the appendiceal orifice and ileocecal  valve.  Pullback was then performed.  The quality of the prep was excellent  and it is felt that all areas were well seen.  There was a 4 x 8 mm  semipedunculated polyp snared off in a couple of pieces from the edge of a  fold about two haustrations above the cecum.  This is the same general  vicinity as the patient's previous tubulovillous adenoma but it is not clear  whether or not it corresponds to residual polyp tissue from before or a new  polyp.  No other polyps were seen.  There was no evidence of cancer,  colitis, vascular malformations or diverticulosis and retroflexion in the  rectum was unremarkable.  The patient tolerated the procedure well and there  were no  apparent complications.   IMPRESSION:  Medium sized colon polyp removed as above.   PLAN:  Await pathology results.  Anticipate colonoscopic follow-up in two  years in view of the history of recurring adenomatous polyps.                                               Bernette Redbird, M.D.    RB/MEDQ  D:  09/10/2003  T:  09/10/2003  Job:  [id number]   cc:   [name]. Fields, M.D.  [address] [place]  [place] [address]  Fax: [phone]

## 2011-04-07 ENCOUNTER — Other Ambulatory Visit: Payer: Self-pay | Admitting: Family Medicine

## 2011-04-07 MED ORDER — CHLORTHALIDONE 25 MG PO TABS: 25.0000 mg | ORAL_TABLET | Freq: Every day | ORAL | Status: DC

## 2011-04-07 MED ORDER — ATENOLOL 50 MG PO TABS: 50.0000 mg | ORAL_TABLET | Freq: Every day | ORAL | Status: DC

## 2011-06-01 ENCOUNTER — Ambulatory Visit (INDEPENDENT_AMBULATORY_CARE_PROVIDER_SITE_OTHER): Payer: Medicare Other | Admitting: Home Health Services

## 2011-06-01 ENCOUNTER — Encounter: Payer: Self-pay | Admitting: Home Health Services

## 2011-06-01 VITALS — BP 168/83 | HR 63 | Temp 98.5°F | Ht 64.5 in | Wt 173.5 lb

## 2011-06-01 DIAGNOSIS — Z0000 Encounter for general adult medical examination without abnormal findings: Secondary | ICD-10-CM

## 2011-06-01 NOTE — Patient Instructions (Signed)
1. Continue to walk at least 5 times a week for 15-30 minutes. 2. Focus on eating 3-4 vegetables a day and limiting fried foods to 1-2 times a week. 3. Contact pharmacy for shingles vaccine. 4. Monitor and record your blood pressure 2-3 times a week, if blood pressure remains higher than 140/90, schedule and appointment with Dr. Fara Boros.

## 2011-06-01 NOTE — Progress Notes (Signed)
Patient here for annual wellness visit, patient reports: Risk Factors/Conditions needing evaluation or treatment: Pt does not have any risk factors that need evaluation. Home Safety: Pt lives by self in 2 story home.  Pt reports having smoke detectors and adaptive equipment in bathroom.  Other Information: Corrective lens: Pt wears daily corrective lens and visits eye doctor every 18 months. Dentures: Pt has partial upper and visits dentist as needed.  Memory: Pt reports some memory problems. Patient's Mini Mental Score (recorded in doc. flowsheet): 29  Balance/Gait: Pt does not have any noticeable impairment.   Pt does have cane since hip replacement but rarely uses it.  Balance Abnormal Patient value  Sitting balance    Sit to stand    Attempts to arise    Immediate standing balance    Standing balance    Nudge    Eyes closed- Romberg    Tandem stance    Back lean    Neck Rotation    360 degree turn    Sitting down     Gait Abnormal Patient value  Initiation of gait    Step length-left    Step length-right    Step height-left    Step height-right    Step symmetry    Step continuity    Path deviation    Trunk movement    Walking stance        Annual Wellness Visit Requirements Recorded Today In  Medical, family, social history Past Medical, Family, Social History Section  Current providers Care team  Current medications Medications  Wt, BP, Ht, BMI Vital signs  Hearing assessment (welcome visit) Hearing/vision  Tobacco, alcohol, illicit drug use History  ADL Nurse Assessment  Depression Screening Nurse Assessment  Cognitive impairment Nurse Assessment  Mini Mental Status Document Flowsheet  Fall Risk Nurse Assessment  Home Safety Progress Note  End of Life Planning (welcome visit) Social Documentation  Medicare preventative services Progress Note  Risk factors/conditions needing evaluation/treatment Progress Note  Personalized health advice Patient  Instructions, goals, letter  Diet & Exercise Social Documentation  Emergency Contact Social Documentation  Seat Belts Social Documentation  Sun exposure/protection Social Documentation    Prevention Plan: Recommended pt contact pharmacy for shingles vaccine.  Recommended Medicare Prevention Screenings Women over 77 Test For Frequency Date of Last- BOLD if needed  Breast Cancer 1-2 yrs 12/11  Cervical Cancer 1-3 yrs Not indicated  Colorectal Cancer 1-10 yrs 4/10  Osteoporosis once 6/05  Cholesterol 5 yrs 10/11  Diabetes yearly Non diabetic  HIV yearly declined  Influenza Shot yearly declined  Pneumonia Shot once 10/01  Zostavax Shot once recommended

## 2011-06-02 ENCOUNTER — Encounter: Payer: Self-pay | Admitting: Home Health Services

## 2011-06-02 NOTE — Progress Notes (Signed)
ADDENDUM:  I have reviewed this visit and discussed with Arlys John and agree with her documentation.   Filippa Yarbough

## 2011-06-30 ENCOUNTER — Telehealth: Payer: Self-pay | Admitting: Family Medicine

## 2011-06-30 DIAGNOSIS — G5600 Carpal tunnel syndrome, unspecified upper limb: Secondary | ICD-10-CM | POA: Insufficient documentation

## 2011-06-30 NOTE — Telephone Encounter (Signed)
Referral order is in, however, PATIENT HAS NEVER BEEN SEEN FOR THIS since [facility] started. She needs to make an appt to meet me before I will put any other orders in for her. Pt should also be informed that 24 hour notice is not appropriate.

## 2011-06-30 NOTE — Telephone Encounter (Signed)
Linda Macias is calling needing a referral to [facility] to be seen for her Carpal Tunnel.  The # is [phone], Dr. Manning Charity.  Please give her a call if there are any questions or when this has been done.  Her appt is set for 8/30 at 11:00,  She needs the referral before her appt.

## 2011-06-30 NOTE — Telephone Encounter (Signed)
Please put in referral for patient, Thanks

## 2011-07-01 NOTE — Telephone Encounter (Signed)
Faxed referral to [phone] .Arlyss Repress

## 2011-07-12 ENCOUNTER — Ambulatory Visit
Payer: Medicare Other | Attending: Physical Medicine and Rehabilitation | Admitting: Rehabilitative and Restorative Service Providers"

## 2011-07-12 DIAGNOSIS — M545 Low back pain, unspecified: Secondary | ICD-10-CM | POA: Insufficient documentation

## 2011-07-12 DIAGNOSIS — M2569 Stiffness of other specified joint, not elsewhere classified: Secondary | ICD-10-CM | POA: Insufficient documentation

## 2011-07-12 DIAGNOSIS — M6281 Muscle weakness (generalized): Secondary | ICD-10-CM | POA: Insufficient documentation

## 2011-07-12 DIAGNOSIS — IMO0001 Reserved for inherently not codable concepts without codable children: Secondary | ICD-10-CM | POA: Insufficient documentation

## 2011-07-14 ENCOUNTER — Ambulatory Visit: Payer: Medicare Other

## 2011-07-21 ENCOUNTER — Ambulatory Visit: Payer: Medicare Other

## 2011-07-22 ENCOUNTER — Ambulatory Visit: Payer: Medicare Other

## 2011-07-26 ENCOUNTER — Encounter: Payer: Medicare Other | Admitting: Rehabilitative and Restorative Service Providers"

## 2011-07-26 LAB — URINALYSIS, MICROSCOPIC ONLY
Bilirubin Urine: NEGATIVE
Glucose, UA: NEGATIVE
Ketones, ur: NEGATIVE
Nitrite: NEGATIVE
Protein, ur: NEGATIVE
Specific Gravity, Urine: 1.019
Urobilinogen, UA: 1
pH: 5.5

## 2011-07-26 LAB — POCT URINALYSIS DIP (DEVICE)
Bilirubin Urine: NEGATIVE
Glucose, UA: NEGATIVE
Ketones, ur: NEGATIVE
Nitrite: NEGATIVE
Operator id: 116391
Protein, ur: NEGATIVE
Specific Gravity, Urine: 1.015
Urobilinogen, UA: 1
pH: 5.5

## 2011-07-26 LAB — WET PREP, GENITAL
Clue Cells Wet Prep HPF POC: NONE SEEN
Trich, Wet Prep: NONE SEEN
Yeast Wet Prep HPF POC: NONE SEEN

## 2011-07-26 LAB — URINE CULTURE: Colony Count: >=100000

## 2011-07-28 ENCOUNTER — Encounter: Payer: Medicare Other | Admitting: Rehabilitative and Restorative Service Providers"

## 2011-09-27 ENCOUNTER — Other Ambulatory Visit: Payer: Self-pay | Admitting: Family Medicine

## 2011-09-27 DIAGNOSIS — Z1231 Encounter for screening mammogram for malignant neoplasm of breast: Secondary | ICD-10-CM

## 2011-11-03 ENCOUNTER — Ambulatory Visit
Admission: RE | Admit: 2011-11-03 | Discharge: 2011-11-03 | Disposition: A | Discharge status: Home / Self Care | Payer: Medicare Other | Source: Ambulatory Visit | Attending: Family Medicine | Admitting: Family Medicine

## 2011-11-03 ENCOUNTER — Ambulatory Visit: Payer: Medicare Other

## 2011-11-03 DIAGNOSIS — Z1231 Encounter for screening mammogram for malignant neoplasm of breast: Secondary | ICD-10-CM

## 2011-11-17 ENCOUNTER — Other Ambulatory Visit: Payer: Self-pay | Admitting: Family Medicine

## 2011-11-17 MED ORDER — CHLORTHALIDONE 25 MG PO TABS: 25.0000 mg | ORAL_TABLET | Freq: Every day | ORAL | Status: DC

## 2011-11-17 MED ORDER — ATENOLOL 50 MG PO TABS: 50.0000 mg | ORAL_TABLET | Freq: Every day | ORAL | Status: DC

## 2011-11-17 NOTE — Telephone Encounter (Signed)
Received Rx request by [facility] for atenolol and chlorthalidone. Pt has not been seen in >1 year.  Will give 30 day supply + 1 refill in order to get patient to appointment with me but must be seen before ANY further refills will be given.

## 2012-01-11 ENCOUNTER — Other Ambulatory Visit: Payer: Self-pay | Admitting: Family Medicine

## 2012-01-11 NOTE — Telephone Encounter (Signed)
Refill request

## 2012-01-20 ENCOUNTER — Encounter: Payer: Self-pay | Admitting: Family Medicine

## 2012-02-15 ENCOUNTER — Encounter: Payer: Self-pay | Admitting: Family Medicine

## 2012-02-15 ENCOUNTER — Ambulatory Visit (INDEPENDENT_AMBULATORY_CARE_PROVIDER_SITE_OTHER): Payer: Medicare Other | Admitting: Family Medicine

## 2012-02-15 VITALS — BP 132/74 | HR 85 | Temp 98.4°F | Ht 64.5 in | Wt 171.0 lb

## 2012-02-15 DIAGNOSIS — I10 Essential (primary) hypertension: Secondary | ICD-10-CM

## 2012-02-15 DIAGNOSIS — Z01419 Encounter for gynecological examination (general) (routine) without abnormal findings: Secondary | ICD-10-CM

## 2012-02-15 DIAGNOSIS — E7800 Pure hypercholesterolemia, unspecified: Secondary | ICD-10-CM

## 2012-02-15 DIAGNOSIS — Z0000 Encounter for general adult medical examination without abnormal findings: Secondary | ICD-10-CM

## 2012-02-15 LAB — LIPID PANEL
Cholesterol: 274 mg/dL — ABNORMAL HIGH (ref 0–200)
HDL: 48 mg/dL (ref >39)
LDL Cholesterol: 200 mg/dL — ABNORMAL HIGH (ref 0–99)
Total CHOL/HDL Ratio: 5.7 Ratio
Triglycerides: 130 mg/dL (ref <150)
VLDL: 26 mg/dL (ref 0–40)

## 2012-02-15 LAB — BASIC METABOLIC PANEL
BUN: 13 mg/dL (ref 6–23)
CO2: 29 mEq/L (ref 19–32)
Calcium: 9.5 mg/dL (ref 8.4–10.5)
Chloride: 104 mEq/L (ref 96–112)
Creat: 0.71 mg/dL (ref 0.50–1.10)
Glucose, Bld: 113 mg/dL — ABNORMAL HIGH (ref 70–99)
Potassium: 3.8 mEq/L (ref 3.5–5.3)
Sodium: 140 mEq/L (ref 135–145)

## 2012-02-15 MED ORDER — CHLORTHALIDONE 25 MG PO TABS: 25.0000 mg | ORAL_TABLET | Freq: Every day | ORAL | Status: DC

## 2012-02-15 MED ORDER — ATENOLOL 50 MG PO TABS: 50.0000 mg | ORAL_TABLET | Freq: Every day | ORAL | Status: DC

## 2012-02-15 MED ORDER — DESLORATADINE 5 MG PO TABS: 5.0000 mg | ORAL_TABLET | Freq: Every day | ORAL | Status: DC

## 2012-02-15 NOTE — Progress Notes (Signed)
  Subjective:     Linda Macias is a 78 y.o. female and is here for a comprehensive physical exam. The patient reports no problems. Is overall doing very well.  Enjoys fishing 5 days/week and walks.  Eats no red meat, no EtOH, tob, drugs. Arthritis is well controlled-- does not take any pain medications, uses ice and heat PRN. Had carpal tunnel surgery done earlier this month and has been released by ortho, stating that it has healed. Refuses Zostavax.    History   Social History  . Marital Status: Widowed    Spouse Name: N/A    Number of Children: 1  . Years of Education: 2 college   Occupational History  . Retired- Advertising account planner    Social History Main Topics  . Smoking status: Never Smoker   . Smokeless tobacco: Never Used  . Alcohol Use: No  . Drug Use: No  . Sexually Active: Not on file   Other Topics Concern  . Not on file   Social History Narrative   Health Care POA: Emergency Contact: daughter, Linda Macias, (c) [phone] of Life Plan: Who lives with you: selfAny pets: noneDiet: Pt has varied diet of protein, starch, and vegetables.  Pt reports eating fried food several times a week.Exercise: Pt walks every day for 10-20 minutes.Seatbelts: Pt reports wearing seatbelt when in vehicle. Wynelle Link Exposure/Protection: Hobbies: fishing, church   Health Maintenance  Topic Date Due  . Zostavax  10/23/1985  . Influenza Vaccine  08/01/2012  . Colonoscopy  01/31/2019  . Tetanus/tdap  08/21/2020  . Pneumococcal Polysaccharide Vaccine Age 66 And Over  Completed    The following portions of the patient's history were reviewed and updated as appropriate: allergies, current medications, past family history, past medical history, past social history, past surgical history and problem list.  Review of Systems A comprehensive review of systems was negative.   Objective:    BP 132/74  Pulse 85  Temp(Src) 98.4 F (36.9 C) (Oral)  Ht 5' 4.5" (1.638 m)  Wt 171 lb (77.565 kg)   BMI 28.90 kg/m2 General appearance: alert, cooperative, no distress and appears younger than age Eyes: conjunctivae/corneas clear. PERRL, EOM's intact. Fundi benign. Nose: Nares normal. Septum midline. Mucosa normal. No drainage or sinus tenderness. Throat: lips, mucosa, and tongue normal; teeth and gums normal Neck: no adenopathy, no carotid bruit, supple, symmetrical, trachea midline and thyroid not enlarged, symmetric, no tenderness/mass/nodules Lungs: clear to auscultation bilaterally Heart: regular rate and rhythm, S1, S2 normal, no murmur, click, rub or gallop Abdomen: soft, non-tender; bowel sounds normal; no masses,  no organomegaly Extremities: extremities normal, atraumatic, no cyanosis or edema Pulses: 2+ and symmetric Skin: Skin color, texture, turgor normal. No rashes or lesions Neurologic: Grossly normal    Assessment:    Healthy female exam. HTN well controlled     Plan:     See After Visit Summary for Counseling Recommendations

## 2012-02-15 NOTE — Assessment & Plan Note (Signed)
Doing well.  Refuses zostavax, otherwise HM is UTD.  F/u 1 year for next well woman exam.

## 2012-02-15 NOTE — Assessment & Plan Note (Signed)
BP well controlled today on recheck.  Check BMET.  Continue atenolol and chlorthalidone.

## 2012-02-15 NOTE — Assessment & Plan Note (Signed)
Check FLP today. 

## 2012-02-15 NOTE — Patient Instructions (Signed)
It was great to meet you today! I am refilling your medicines for a year!  Think about getting the shingles vaccine!!  Come back see me in a year, sooner if issues.

## 2012-02-16 ENCOUNTER — Encounter: Payer: Self-pay | Admitting: Family Medicine

## 2012-02-16 LAB — CBC
HCT: 37.8 % (ref 36.0–46.0)
Hemoglobin: 12 g/dL (ref 12.0–15.0)
MCH: 23.3 pg — ABNORMAL LOW (ref 26.0–34.0)
MCHC: 31.7 g/dL (ref 30.0–36.0)
MCV: 73.4 fL — ABNORMAL LOW (ref 78.0–100.0)
Platelets: 230 10*3/uL (ref 150–400)
RBC: 5.15 MIL/uL — ABNORMAL HIGH (ref 3.87–5.11)
RDW: 15.2 % (ref 11.5–15.5)
WBC: 4.6 10*3/uL (ref 4.0–10.5)

## 2012-03-13 ENCOUNTER — Other Ambulatory Visit: Payer: Self-pay | Admitting: Family Medicine

## 2012-03-13 DIAGNOSIS — I10 Essential (primary) hypertension: Secondary | ICD-10-CM

## 2012-03-13 MED ORDER — CHLORTHALIDONE 25 MG PO TABS: 25.0000 mg | ORAL_TABLET | Freq: Every day | ORAL | Status: DC

## 2012-03-13 MED ORDER — ATENOLOL 50 MG PO TABS: 50.0000 mg | ORAL_TABLET | Freq: Every day | ORAL | Status: DC

## 2012-03-28 ENCOUNTER — Encounter (HOSPITAL_COMMUNITY): Payer: Self-pay | Admitting: *Deleted

## 2012-03-28 ENCOUNTER — Emergency Department (INDEPENDENT_AMBULATORY_CARE_PROVIDER_SITE_OTHER)
Admission: EM | Admit: 2012-03-28 | Discharge: 2012-03-28 | Disposition: A | Discharge status: Home / Self Care | Payer: Medicare Other | Source: Home / Self Care | Attending: Family Medicine | Admitting: Family Medicine

## 2012-03-28 DIAGNOSIS — S30860A Insect bite (nonvenomous) of lower back and pelvis, initial encounter: Secondary | ICD-10-CM

## 2012-03-28 DIAGNOSIS — S30861A Insect bite (nonvenomous) of abdominal wall, initial encounter: Secondary | ICD-10-CM

## 2012-03-28 MED ORDER — FLUTICASONE PROPIONATE 0.05 % EX CREA: TOPICAL_CREAM | Freq: Two times a day (BID) | CUTANEOUS | Status: AC

## 2012-03-28 MED ORDER — DOXYCYCLINE HYCLATE 100 MG PO CAPS: 100.0000 mg | ORAL_CAPSULE | Freq: Two times a day (BID) | ORAL | Status: AC

## 2012-03-28 NOTE — Discharge Instructions (Signed)
Use warm cloth soak to area and take medicine as prescribed, return or see your doctor as needed.

## 2012-03-28 NOTE — ED Notes (Signed)
Pt  Reports     She  Was  Bitten     By  A  Tick    3  Days      Ago  Her  Sister  Removed  It    The  Area       Is  Slightly  reddned          The area  Is  In the  Abdominal  Area

## 2012-03-28 NOTE — ED Provider Notes (Signed)
History     CSN: [id number]  Arrival date & time 03/28/12  1727   First MD Initiated Contact with Patient 03/28/12 1728      Chief Complaint  Patient presents with  . Tick Removal    (Consider location/radiation/quality/duration/timing/severity/associated sxs/prior treatment) Patient is a 78 y.o. female presenting with rash. The history is provided by the patient.  Rash  This is a new problem. The current episode started more than 2 days ago. The problem has been gradually worsening. The problem is associated with an insect bite/sting (tick removed from left lat abdomen on sat, today red and itching.). There has been no fever.    Past Medical History  Diagnosis Date  . Hyperlipidemia   . Hypertension   . Osteoarthritis, knee/lower leg   . CVA (cerebral infarction)     No residuals, cannot tolerate statins   . Arthritis     Past Surgical History  Procedure Date  . Total hip arthroplasty 10/10    Family History  Problem Relation Age of Onset  . Cancer Mother   . Heart disease Mother   . Cancer Sister     esophagus  . Cancer Sister     colon  . Cancer Sister     abdomin    History  Substance Use Topics  . Smoking status: Never Smoker   . Smokeless tobacco: Never Used  . Alcohol Use: No    OB History    Grav Para Term Preterm Abortions TAB SAB Ect Mult Living                  Review of Systems  Constitutional: Negative.   Gastrointestinal: Negative.   Skin: Positive for rash.    Allergies  Ciprocin-fluocin-procin  Home Medications   Current Outpatient Rx  Name Route Sig Dispense Refill  . ATENOLOL 50 MG PO TABS Oral Take 1 tablet (50 mg total) by mouth daily. 90 tablet 3  . CHLORTHALIDONE 25 MG PO TABS Oral Take 1 tablet (25 mg total) by mouth daily. 90 tablet 3  . DESLORATADINE 5 MG PO TABS Oral Take 1 tablet (5 mg total) by mouth daily. 30 tablet 11  . DOXYCYCLINE HYCLATE 100 MG PO CAPS Oral Take 1 capsule (100 mg total) by mouth 2 (two) times  daily. 14 capsule 0  . FLUTICASONE PROPIONATE 0.05 % EX CREA Topical Apply topically 2 (two) times daily. 30 g 0    BP 146/88  Pulse 78  Temp 98.6 F (37 C)  Resp 18  SpO2 98%  Physical Exam  Nursing note and vitals reviewed. Constitutional: She appears well-developed and well-nourished.  Skin: Skin is warm and dry. Rash noted.       Local erythema approx 3 cm around tick removal site, no fb , no drainage.    ED Course  Procedures (including critical care time)  Labs Reviewed - No data to display No results found.   1. Insect bite of abdomen with local reaction, initial encounter       MDM          Linna Hoff, MD 03/28/12 [phone]

## 2012-04-12 ENCOUNTER — Other Ambulatory Visit: Payer: Self-pay | Admitting: Family Medicine

## 2012-04-12 DIAGNOSIS — I10 Essential (primary) hypertension: Secondary | ICD-10-CM

## 2012-04-12 MED ORDER — CHLORTHALIDONE 25 MG PO TABS: 25.0000 mg | ORAL_TABLET | Freq: Every day | ORAL | Status: DC

## 2012-05-30 ENCOUNTER — Encounter: Payer: Self-pay | Admitting: Home Health Services

## 2012-07-06 ENCOUNTER — Other Ambulatory Visit: Payer: Self-pay | Admitting: Gastroenterology

## 2012-08-09 ENCOUNTER — Encounter: Payer: Self-pay | Admitting: Family Medicine

## 2012-08-17 ENCOUNTER — Encounter: Payer: Self-pay | Admitting: Family Medicine

## 2012-09-15 ENCOUNTER — Encounter: Payer: Self-pay | Admitting: Family Medicine

## 2012-09-15 ENCOUNTER — Ambulatory Visit (INDEPENDENT_AMBULATORY_CARE_PROVIDER_SITE_OTHER): Payer: Medicare Other | Admitting: Family Medicine

## 2012-09-15 VITALS — BP 144/80 | HR 66 | Temp 98.1°F | Ht 64.5 in | Wt 161.0 lb

## 2012-09-15 DIAGNOSIS — D126 Benign neoplasm of colon, unspecified: Secondary | ICD-10-CM

## 2012-09-15 DIAGNOSIS — K5900 Constipation, unspecified: Secondary | ICD-10-CM

## 2012-09-15 NOTE — Progress Notes (Signed)
S: Pt comes in today for SDA for constipation.  Had a colonoscopy 07/06/12 with Dr. Matthias Hughs which showed 2 benign polyps (based on path report).  Since that time, has been having trouble with her bowels after taking the clean out.  Usually has 1 BM per day.  Will fluctuate from constipation to loose stools, but if afraid of diarrhea because has h/o diarrhea s/p abx x37 days.  If she has milk, will have loose stools the next day.  Last BM was this AM- used suppository.  Needing suppository once every 2-3 days.  BM this morning was soft.  Still having one every day because she makes sure she has one- will give herself a suppository.    Also has hemorrhoids, which is the reason she will not go more than 1 day without a BM.  Uses a cream on them (preperation H) as needed.  None that are painful or bleeding.  Doesn't feel like they need to be looked at today.   No blood in stool, no dark tarry stools.  No N/V.  Loose stools after milk/dairy or a whole lot of fiber.   Has tried Miralax previously but not recently.  Would really just like to go back to Dr. Matthias Hughs, but was told she needs a referral. Just had colonoscopy by him on 07/06/12.   ROS: Per HPI  History  Smoking status  . Never Smoker   Smokeless tobacco  . Never Used    O:  Filed Vitals:   09/15/12 0944  BP: 156/87  Pulse: 66  Temp: 98.1 F (36.7 C)    Gen: NAD CV: RRR, no murmur Abd: soft, nontender, +BS   A/P: 78 y.o. female p/w constipation, desire for referral to her regular GI MD -See problem list -f/u in PRN

## 2012-09-15 NOTE — Assessment & Plan Note (Signed)
Doing suppositories qod since she does not want to go more than 1 day without a BM.  Is not interested in starting Miralax, would just like to see her regular GI MD about this.  She has h/o colon polyps and has been having issues with constipation since her most recent colonoscopy in 07/2012. No red flags, GI referral placed.

## 2012-09-15 NOTE — Patient Instructions (Addendum)
It was nice to see you again today.  For your bowels, you could try taking Miralax 1 capful in 8oz of water or clear liquid daily and see if that helps you have a bowel movement every day without needing a suppository.    We will send in a referral to have you see Dr. Matthias Hughs.   Come back to see me as needed.

## 2012-09-19 ENCOUNTER — Telehealth: Payer: Self-pay | Admitting: Family Medicine

## 2012-09-19 NOTE — Telephone Encounter (Signed)
[facility] [facility] annual wellness form completed and placed up front in the "to call pt" slot.  Please let pt know I have filled out the MD section and lab section and that she can come pick it up anytime. Thanks!

## 2012-09-26 ENCOUNTER — Other Ambulatory Visit: Payer: Self-pay | Admitting: Family Medicine

## 2012-09-26 DIAGNOSIS — Z1231 Encounter for screening mammogram for malignant neoplasm of breast: Secondary | ICD-10-CM

## 2012-10-16 ENCOUNTER — Encounter: Payer: Self-pay | Admitting: Family Medicine

## 2012-11-06 ENCOUNTER — Ambulatory Visit: Payer: Medicare Other

## 2012-11-16 ENCOUNTER — Ambulatory Visit
Admission: RE | Admit: 2012-11-16 | Discharge: 2012-11-16 | Disposition: A | Discharge status: Home / Self Care | Payer: Medicare Other | Source: Ambulatory Visit | Attending: Family Medicine | Admitting: Family Medicine

## 2012-11-16 DIAGNOSIS — Z1231 Encounter for screening mammogram for malignant neoplasm of breast: Secondary | ICD-10-CM

## 2012-11-25 ENCOUNTER — Encounter: Payer: Self-pay | Admitting: Family Medicine

## 2013-03-01 ENCOUNTER — Other Ambulatory Visit: Payer: Self-pay | Admitting: Family Medicine

## 2013-04-07 ENCOUNTER — Other Ambulatory Visit: Payer: Self-pay | Admitting: Family Medicine

## 2013-07-16 ENCOUNTER — Other Ambulatory Visit: Payer: Self-pay | Admitting: Family Medicine

## 2013-07-16 NOTE — Telephone Encounter (Signed)
LMVM for pt to call and schedule and appt.  Linda Macias, Darlyne Russian, CMA

## 2013-07-16 NOTE — Telephone Encounter (Signed)
Refilled HTN meds, will ask for appt. Has been 10 months since she's been seen and over a year since she was seen for HTN.   Murtis Sink, MD [facility] Resident, PGY-2 07/16/2013, 4:09 PM

## 2013-08-16 ENCOUNTER — Other Ambulatory Visit: Payer: Self-pay | Admitting: Gastroenterology

## 2013-10-04 ENCOUNTER — Encounter: Payer: Self-pay | Admitting: Family Medicine

## 2013-10-04 ENCOUNTER — Ambulatory Visit (INDEPENDENT_AMBULATORY_CARE_PROVIDER_SITE_OTHER): Payer: Medicare Other | Admitting: Family Medicine

## 2013-10-04 VITALS — BP 154/85 | HR 61 | Temp 97.5°F | Ht 63.5 in | Wt 162.0 lb

## 2013-10-04 DIAGNOSIS — I10 Essential (primary) hypertension: Secondary | ICD-10-CM

## 2013-10-04 DIAGNOSIS — Z01419 Encounter for gynecological examination (general) (routine) without abnormal findings: Secondary | ICD-10-CM

## 2013-10-04 DIAGNOSIS — Z0000 Encounter for general adult medical examination without abnormal findings: Secondary | ICD-10-CM

## 2013-10-04 DIAGNOSIS — D62 Acute posthemorrhagic anemia: Secondary | ICD-10-CM

## 2013-10-04 DIAGNOSIS — L600 Ingrowing nail: Secondary | ICD-10-CM

## 2013-10-04 DIAGNOSIS — E7800 Pure hypercholesterolemia, unspecified: Secondary | ICD-10-CM

## 2013-10-04 LAB — COMPREHENSIVE METABOLIC PANEL
ALT: 11 U/L (ref 0–35)
AST: 14 U/L (ref 0–37)
Albumin: 3.7 g/dL (ref 3.5–5.2)
Alkaline Phosphatase: 90 U/L (ref 39–117)
BUN: 15 mg/dL (ref 6–23)
CO2: 28 mEq/L (ref 19–32)
Calcium: 9.5 mg/dL (ref 8.4–10.5)
Chloride: 101 mEq/L (ref 96–112)
Creat: 0.68 mg/dL (ref 0.50–1.10)
Glucose, Bld: 94 mg/dL (ref 70–99)
Potassium: 3.7 mEq/L (ref 3.5–5.3)
Sodium: 140 mEq/L (ref 135–145)
Total Bilirubin: 0.5 mg/dL (ref 0.3–1.2)
Total Protein: 6.9 g/dL (ref 6.0–8.3)

## 2013-10-04 LAB — CBC WITH DIFFERENTIAL/PLATELET
Basophils Absolute: 0 10*3/uL (ref 0.0–0.1)
Basophils Relative: 0 % (ref 0–1)
Eosinophils Absolute: 0.1 10*3/uL (ref 0.0–0.7)
Eosinophils Relative: 2 % (ref 0–5)
HCT: 34 % — ABNORMAL LOW (ref 36.0–46.0)
Hemoglobin: 10.9 g/dL — ABNORMAL LOW (ref 12.0–15.0)
Lymphocytes Relative: 35 % (ref 12–46)
Lymphs Abs: 2 10*3/uL (ref 0.7–4.0)
MCH: 22.6 pg — ABNORMAL LOW (ref 26.0–34.0)
MCHC: 32.1 g/dL (ref 30.0–36.0)
MCV: 70.5 fL — ABNORMAL LOW (ref 78.0–100.0)
Monocytes Absolute: 0.6 10*3/uL (ref 0.1–1.0)
Monocytes Relative: 10 % (ref 3–12)
Neutro Abs: 2.9 10*3/uL (ref 1.7–7.7)
Neutrophils Relative %: 53 % (ref 43–77)
Platelets: 255 10*3/uL (ref 150–400)
RBC: 4.82 MIL/uL (ref 3.87–5.11)
RDW: 15.7 % — ABNORMAL HIGH (ref 11.5–15.5)
WBC: 5.6 10*3/uL (ref 4.0–10.5)

## 2013-10-04 LAB — LIPID PANEL
Cholesterol: 269 mg/dL — ABNORMAL HIGH (ref 0–200)
HDL: 47 mg/dL (ref >39)
LDL Cholesterol: 202 mg/dL — ABNORMAL HIGH (ref 0–99)
Total CHOL/HDL Ratio: 5.7 Ratio
Triglycerides: 101 mg/dL (ref <150)
VLDL: 20 mg/dL (ref 0–40)

## 2013-10-04 MED ORDER — CHLORTHALIDONE 25 MG PO TABS: ORAL_TABLET | ORAL | Status: DC

## 2013-10-04 NOTE — Assessment & Plan Note (Signed)
elevated, patient declines statin therapy Given her age I think many people would be perfectly fine not treating, I agree to an extent, but given her very health appearence I feel she has plenty of longevity to have benefit from a statin.  Discussed this with her, she declines.  Will check FLP today.

## 2013-10-04 NOTE — Assessment & Plan Note (Signed)
Resolved per last CBC Recheck today, no reason to believe it should be low.

## 2013-10-04 NOTE — Assessment & Plan Note (Signed)
Doing well,  Very pleasant patient regular C scopes at [facility], Refuses Statin and zostavax F/u 1 year.

## 2013-10-04 NOTE — Assessment & Plan Note (Addendum)
No pain on exam, she requests referral to podiatry. sent referral.

## 2013-10-04 NOTE — Assessment & Plan Note (Signed)
Nearly at goal on re-check, Will continue  Same meds BB and thizide diuretic.  No red flags F/u in 1 year per her preference.

## 2013-10-04 NOTE — Patient Instructions (Signed)
It was great to meet you!!  Let me know if you need anything at all  Come back in 1 year

## 2013-10-04 NOTE — Progress Notes (Signed)
Patient ID: Linda Macias, female   DOB: 05-02-1923, 79 y.o.   MRN: [id number]  Kevin Fenton, MD Phone: [phone]  Subjective:  Chief complaint-noted  # annual exam and f/u BP  Ms. Linda Macias is a very pleasant 79 year old female here for her annual exam and follow blood pressure.  HCM Today she refuses Zostavax, and also statin therapy. She sees GI regularly do to having several polyps on each colonoscopy. She sees Dr. Matthias Hughs. She denies any complaints except for arthritic pain in her back and neck that she copes with easily. She did have right hip arthritis for which she underwent right hip replacement and no longer has any problems. She is fasting today and prepared for labs.  Blood pressure She reports perfect compliance with her medications, she watches her salt and fat intake. She denies any smoking, stating that she is never even smoking a cigarette. She has a positive family history for heart disease and that her 6 year old brother had an MI. She also has high cholesterol for which she would not like to be treated. She also notes that she walks nearly every day about 2 blocks, and also does bending exercises daily.  HLD Continues to decline statin use. States she avoids fatty meats and salt intake. - ROS- She denies fever, chills, sweats No chest pain No dyspnea No nausea, vomiting, diarrhea No lower extremity edema Left great toe with ingrown toenail for which she would like to see a podiatrist.  Past Medical History Patient Active Problem List   Diagnosis Date Noted  . Ingrown toenail 10/04/2013  . Constipation 09/15/2012  . Well woman exam 02/15/2012  . Carpal tunnel syndrome 06/30/2011  . Vaginal pain 01/11/2011  . ANEMIA, SECONDARY TO ACUTE BLOOD LOSS 09/04/2009  . HIP JOINT REPLACEMENT BY OTHER MEANS 06/23/2009  . DEGENERATIVE JOINT DISEASE 04/23/2008  . OSTEOARTHROSIS UNSPEC WHETHER GEN/LOCALIZED HAND 01/05/2008  . ARTHRITIS, CERVICAL SPINE  03/09/2007  . COLON POLYP 12/29/2006  . HYPERCHOLESTEROLEMIA 12/29/2006  . OBESITY, NOS 12/29/2006  . HYPERTENSION, BENIGN SYSTEMIC 12/29/2006  . CVA 12/29/2006    Medications- reviewed and updated Current Outpatient Prescriptions  Medication Sig Dispense Refill  . atenolol (TENORMIN) 50 MG tablet TAKE 1 TABLET BY MOUTH DAILY.  90 tablet  3  . chlorthalidone (HYGROTON) 25 MG tablet TAKE 1 TABLET DAILY  90 tablet  3  . desloratadine (CLARINEX) 5 MG tablet Take 1 tablet (5 mg total) by mouth daily.  30 tablet  11   No current facility-administered medications for this visit.    Objective: BP 154/85  Pulse 61  Temp(Src) 97.5 F (36.4 C) (Oral)  Ht 5' 3.5" (1.613 m)  Wt 162 lb (73.483 kg)  BMI 28.24 kg/m2 Gen: NAD, alert, cooperative with exam HEENT: NCAT, EOMI, PERRL, MMM CV: RRR, good S1/S2, no murmur Resp: CTABL, no wheezes, non-labored Abd: SNTND, BS present, no guarding or organomegaly Ext: No edema, warm, 2+ DP pulses, L great toe with appearance of ingrown toenail on lateral edge and no significant tenderness on palpation Neuro: Alert and oriented, No gross deficits, normal gait   Assessment/Plan:  Ingrown toenail No pain on exam, she requests referral to podiatry. sent referral.  ANEMIA, SECONDARY TO ACUTE BLOOD LOSS Resolved per last CBC Recheck today, no reason to believe it should be low.   HYPERCHOLESTEROLEMIA elevated, patient declines statin therapy Given her age I think many people would be perfectly fine not treating, I agree to an extent, but given her very health appearence I  feel she has plenty of longevity to have benefit from a statin.  Discussed this with her, she declines.  Will check FLP today.   HYPERTENSION, BENIGN SYSTEMIC Nearly at goal on re-check, Will continue  Same meds BB and thizide diuretic.  No red flags F/u in 1 year per her preference.   Well woman exam Doing well,  Very pleasant patient regular C scopes at [facility],  Refuses Statin and zostavax F/u 1 year.     Orders Placed This Encounter  Procedures  . Lipid Panel  . CBC with Differential  . Comprehensive metabolic panel  . Ambulatory referral to Podiatry    Referral Priority:  Routine    Referral Type:  Consultation    Referral Reason:  Specialty Services Required    Requested Specialty:  Podiatry    Number of Visits Requested:  1    Meds ordered this encounter  Medications  . chlorthalidone (HYGROTON) 25 MG tablet    Sig: TAKE 1 TABLET DAILY    Dispense:  90 tablet    Refill:  3

## 2013-10-10 ENCOUNTER — Encounter: Payer: Self-pay | Admitting: Family Medicine

## 2013-10-12 ENCOUNTER — Telehealth: Payer: Self-pay | Admitting: Family Medicine

## 2013-10-12 NOTE — Telephone Encounter (Signed)
Lab followup call  Called patient to explain that her cholesterol still high. She still defers treatment. She states that she watches her diet aggressively. Also discussed mild anemia with hemoglobin of 10.9. Discussed that she has had anemia over the past 4 years down to 10.8 and up to 12.0. With an MCV of 70.5 this is likely iron deficiency anemia with her source of blood loss GI polyps. She does get to regularly history of polyps. Discussed possible iron treatment for her she declines due to constipation and lack of symptoms. Discussed with her options of treatment, she elected to eat iron rich foods and recheck CBC in 6 months.  Murtis Sink, MD [facility] Resident, PGY-2 10/12/2013, 8:45 AM

## 2013-10-16 ENCOUNTER — Ambulatory Visit: Payer: Medicare Other | Attending: Physical Medicine and Rehabilitation | Admitting: Physical Therapy

## 2013-10-16 DIAGNOSIS — M6281 Muscle weakness (generalized): Secondary | ICD-10-CM | POA: Insufficient documentation

## 2013-10-16 DIAGNOSIS — M542 Cervicalgia: Secondary | ICD-10-CM | POA: Insufficient documentation

## 2013-10-16 DIAGNOSIS — IMO0001 Reserved for inherently not codable concepts without codable children: Secondary | ICD-10-CM | POA: Insufficient documentation

## 2013-10-17 ENCOUNTER — Encounter: Payer: Self-pay | Admitting: Podiatry

## 2013-10-17 ENCOUNTER — Ambulatory Visit (INDEPENDENT_AMBULATORY_CARE_PROVIDER_SITE_OTHER): Payer: Medicare Other | Admitting: Podiatry

## 2013-10-17 VITALS — BP 149/75 | HR 63 | Resp 16 | Ht 63.0 in | Wt 162.0 lb

## 2013-10-17 DIAGNOSIS — L600 Ingrowing nail: Secondary | ICD-10-CM

## 2013-10-17 NOTE — Patient Instructions (Signed)

## 2013-10-17 NOTE — Progress Notes (Signed)
   Subjective:    Patient ID: Linda Macias, female    DOB: January 06, 1923, 79 y.o.   MRN: [id number]  HPI Comments: "I have an ingrown toenail"  Pt c/o of ingrown toenails, medial border. Has been achy for a few mos. Has had the ingrown for years but gradually worsening. Been trimming it down occasionally.     Review of Systems  HENT: Positive for hearing loss and sinus pressure.   Gastrointestinal: Positive for constipation.  Musculoskeletal: Positive for arthralgias and myalgias.  All other systems reviewed and are negative.       Objective:   Physical Exam        Assessment & Plan:

## 2013-10-18 ENCOUNTER — Ambulatory Visit: Payer: Medicare Other | Admitting: Physical Therapy

## 2013-10-18 ENCOUNTER — Encounter: Payer: Self-pay | Admitting: Family Medicine

## 2013-10-18 DIAGNOSIS — M47812 Spondylosis without myelopathy or radiculopathy, cervical region: Secondary | ICD-10-CM | POA: Insufficient documentation

## 2013-10-29 ENCOUNTER — Ambulatory Visit: Payer: Medicare Other | Admitting: Physical Therapy

## 2013-10-31 ENCOUNTER — Ambulatory Visit: Payer: Medicare Other | Admitting: Physical Therapy

## 2013-11-05 ENCOUNTER — Other Ambulatory Visit: Payer: Self-pay

## 2013-11-05 DIAGNOSIS — Z1231 Encounter for screening mammogram for malignant neoplasm of breast: Secondary | ICD-10-CM

## 2013-11-22 ENCOUNTER — Ambulatory Visit
Admission: RE | Admit: 2013-11-22 | Discharge: 2013-11-22 | Disposition: A | Discharge status: Home / Self Care | Payer: Medicare Other | Source: Ambulatory Visit

## 2013-11-22 DIAGNOSIS — Z1231 Encounter for screening mammogram for malignant neoplasm of breast: Secondary | ICD-10-CM

## 2014-01-07 ENCOUNTER — Telehealth: Payer: Self-pay | Admitting: Family Medicine

## 2014-01-07 DIAGNOSIS — H9190 Unspecified hearing loss, unspecified ear: Secondary | ICD-10-CM

## 2014-01-07 NOTE — Telephone Encounter (Signed)
Pt called and would like a referral for a hearing aid. jw

## 2014-01-07 NOTE — Telephone Encounter (Signed)
Pt requesting audiology referral for hearing loss. This is reasonable, Will refer.   Laroy Apple, MD [place] Resident, PGY-2 01/07/2014, 4:29 PM

## 2014-01-07 NOTE — Telephone Encounter (Signed)
Will fwd. To PCP for review .Kingson Lohmeyer  

## 2014-01-09 NOTE — Telephone Encounter (Signed)
Pt notified.  Linda Macias L, CMA  

## 2014-01-23 ENCOUNTER — Telehealth: Payer: Self-pay | Admitting: *Deleted

## 2014-01-23 NOTE — Telephone Encounter (Signed)
LM for patient to call back.  PLease ask if she has received a flu shot this year.  She has previously declined, so I just wanted to verify she didn't receive it somewhere else. Thanks [employer]

## 2014-01-24 ENCOUNTER — Telehealth: Payer: Self-pay | Admitting: Family Medicine

## 2014-01-24 NOTE — Telephone Encounter (Signed)
Patient declined for flu shot

## 2014-02-14 ENCOUNTER — Other Ambulatory Visit: Payer: Self-pay | Admitting: Family Medicine

## 2014-02-18 ENCOUNTER — Ambulatory Visit: Payer: Medicare Other | Attending: Family Medicine | Admitting: Audiology

## 2014-02-18 DIAGNOSIS — H903 Sensorineural hearing loss, bilateral: Secondary | ICD-10-CM

## 2014-02-18 DIAGNOSIS — H9190 Unspecified hearing loss, unspecified ear: Secondary | ICD-10-CM | POA: Diagnosis not present

## 2014-02-18 DIAGNOSIS — Z5189 Encounter for other specified aftercare: Secondary | ICD-10-CM | POA: Insufficient documentation

## 2014-02-18 DIAGNOSIS — H93299 Other abnormal auditory perceptions, unspecified ear: Secondary | ICD-10-CM

## 2014-02-18 NOTE — Patient Instructions (Addendum)
Linda Macias has has moderate low frequency hearing loss sloping to a severe high frequency hearing loss. The hearing loss appears sensorineural in both ears.  She has excellent word recognition in quiet at loud levels. But at normal conversational speech levels her word recognition in poor bilaterally.  In minimal background noise her word recognition drops from excellent to borderline fair in each ear.    Linda Macias would benefit from amplification-preferably in each ear.     Recommendations.  1.  A hearing aid evaluation as soon as possible.   Amplification helps make the signal louder and therefore often improves hearing and word recognition.  Amplification has many forms including hearing aids in one or both ears, an assistive listening device which have a microphone and speaker such as a small handheld device and/or even a surround sound system of speakers.  Amplification may be covered by some insurances, but not all.  It is important to note that hearing aids must be individually fit according to the hearing test results and the ear shape.  Audiologists and hearing aid dealers in [place] must be licensed in order to dispense hearing aids.  In addition, a trial period is mandated by law in our state because often amplification must be tried and then evaluated in order to determine benefit.       There are many excellent choices when it comes to amplification in our area and providers are listed in the phone book under hearing aids, may be affiliated with Ear, Nose and Throat physicians, are located at [employer] and [employer] as well as the [facility] speech and hearing center.   2.   Equipment Distribution Services in [place] may help with obtaining one hearing aid or one captioned telephone if financial qualifications are met.  Please contact Miss Patterson at [phone] ext [phone].   Deborah L. Woodward, Au.D., CCC-A Doctor of Audiology 02/18/2014   
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
## 2014-03-07 ENCOUNTER — Encounter: Payer: Self-pay | Admitting: Family Medicine

## 2014-04-16 ENCOUNTER — Telehealth: Payer: Self-pay | Admitting: Family Medicine

## 2014-04-16 NOTE — Telephone Encounter (Signed)
Wanted to inform you that she have an appt for an audiologist

## 2014-05-07 ENCOUNTER — Emergency Department (HOSPITAL_COMMUNITY)
Admission: EM | Admit: 2014-05-07 | Discharge: 2014-05-07 | Discharge status: Absent without leave | Payer: Medicare Other | Source: Home / Self Care

## 2014-05-07 ENCOUNTER — Ambulatory Visit (INDEPENDENT_AMBULATORY_CARE_PROVIDER_SITE_OTHER): Payer: Medicare Other | Admitting: Family Medicine

## 2014-05-07 ENCOUNTER — Ambulatory Visit: Payer: Medicare Other | Admitting: Home Health Services

## 2014-05-07 ENCOUNTER — Encounter: Payer: Self-pay | Admitting: Family Medicine

## 2014-05-07 VITALS — BP 142/77 | HR 64 | Temp 98.1°F | Wt 151.0 lb

## 2014-05-07 DIAGNOSIS — K5641 Fecal impaction: Secondary | ICD-10-CM

## 2014-05-07 DIAGNOSIS — K5900 Constipation, unspecified: Secondary | ICD-10-CM

## 2014-05-07 MED ORDER — OXYCODONE-ACETAMINOPHEN 5-325 MG PO TABS: 1.0000 | ORAL_TABLET | Freq: Three times a day (TID) | ORAL | Status: DC | PRN

## 2014-05-07 MED ORDER — GLYCERIN (LAXATIVE) 2 G RE SUPP: 1.0000 | Freq: Once | RECTAL | Status: DC | PRN

## 2014-05-07 NOTE — Assessment & Plan Note (Addendum)
Acute worsening of chronic constipation, likely due to painful / swollen internal hemorrhoids - Palpable impacted stool in rectal vault on exam. Afebrile, benign abdomen, non-bleeding hemorrhoids  Plan: 1. S/p manual disimpaction in clinic today, no immediate BM 2. Glycerin suppositories (#3, 0 refill), trial to pass BM 3. Recommend resume Miralax daily 4. Sitz Baths for hemorrhoid pain / swelling, continue Preparation H 5. Percocet 5-325, (#5, 0 refills), if unable to get any other pain relief for hemorrhoids. Cautioned may make drowsy / dizzy, no driving. 6. If unable to pass BM with suppositories, recommend return in 2-3 days. May try brief course of lactulose, alternative suppository, repeat disimpaction.  Total time spent face to face with patient and coordination of care was 30 minutes.

## 2014-05-07 NOTE — Progress Notes (Addendum)
Subjective:     Patient ID: Linda Macias, female   DOB: November 03, 1922, 80 y.o.   MRN: [id number]  Patient presents for a same day visit  HPI  CONSTIPATION / HEMORRHOIDS: - Reports about 1-2 months ago, reports went to a fundraiser, ate a friend chicken breast, followed up with stomach aches and intermittent constipation. Her GI physician recommended using Miralax and a bland / soft diet. Reported hx multiple colonoscopies in past. - States that she has been having regular BMs with Miralax, however last night she took teaspoon of milk of magnesia, felt constipated and strained with BM, then felt like her hemorrhoids have been swollen and feels like a "whole lump of stool unable to come out" due to swollen hemorrhoids, but with intermittent leaking of liquid stool since then, continues today, wearing pads due to leakage. - Admits some aching lower abdominal pain - Denies any bleeding, fevers/chills, worsening abdominal pain, nausea / vomiting, HA, dizziness / lightheadedness, SOB - Hx hemorrhoid surgery 1968  I have reviewed and updated the following as appropriate: allergies and current medications  Social Hx: - Never smoker - Functional at home, lives alone  Review of Systems  See above HPI    Objective:   Physical Exam  BP 142/77  Pulse 64  Temp(Src) 98.1 F (36.7 C) (Oral)  Wt 151 lb (68.493 kg)  Gen - pleasant, well-appearing, NAD HEENT - MMM Heart - RRR, no murmurs heard Abd - soft, mild tenderness to palpation periumbilical / lower abdomen, no guarding or rebound, non-distended, +active BS Ext - non-tender, no edema, peripheral pulses intact +2 b/l Skin - warm, dry, no rashes Neuro - awake, alert, oriented Rectal - large non-inflamed non-bleeding external hemorrhoid, no palpable internal hemorrhoids, firm stool palpable in rectal vault, performed manual disimpaction to loosen stool, no immediate solid BM, small amount of non-bloody stool noted  Rectal exam chaperoned /  assisted by Dr. Gwendlyn Deutscher.     Assessment:     See specific A&P problem list for details.      Plan:     See specific A&P problem list for details.

## 2014-05-07 NOTE — Patient Instructions (Signed)
Dear Linda Macias, Thank you for coming in to clinic today.  Today we discussed your Constipation and Hemorrhoids. 1. We did a stool disimpaction today, it should help you have a solid bowel movement 2. Recommend doing a warm "Sitz Bath" at home, use warm water and Epson salts and soak your bottom in a bath for pain relief and reduce hemorrhoid swelling 3. Continue to use Preparation H 4. Recommend to resume using Miralax 5. Sent prescription for Glycerin suppository, please use at home today if you are still constipated 6. Take Percocet for pain only if worsening, be cautious that it may make your dizzy / sleepy  Please schedule a follow-up appointment with Dr. Wendi Snipes as needed in 1 to 2 weeks for follow-up if your symptoms don't improve.  If you have any other questions or concerns, please feel free to call the clinic to contact me. You may also schedule an earlier appointment if necessary.  However, if your symptoms get significantly worse, please go to the Emergency Department to seek immediate medical attention.  Nobie Putnam, [place]

## 2014-05-28 ENCOUNTER — Ambulatory Visit (INDEPENDENT_AMBULATORY_CARE_PROVIDER_SITE_OTHER): Payer: Medicare Other | Admitting: Family Medicine

## 2014-05-28 ENCOUNTER — Encounter: Payer: Self-pay | Admitting: Family Medicine

## 2014-05-28 VITALS — BP 145/76 | HR 69 | Temp 98.1°F | Ht 63.0 in | Wt 148.6 lb

## 2014-05-28 DIAGNOSIS — L600 Ingrowing nail: Secondary | ICD-10-CM

## 2014-05-28 DIAGNOSIS — R1013 Epigastric pain: Secondary | ICD-10-CM | POA: Insufficient documentation

## 2014-05-28 LAB — CBC WITH DIFFERENTIAL/PLATELET
Basophils Absolute: 0 10*3/uL (ref 0.0–0.1)
Basophils Relative: 0 % (ref 0–1)
Eosinophils Absolute: 0.1 10*3/uL (ref 0.0–0.7)
Eosinophils Relative: 2 % (ref 0–5)
HCT: 33.4 % — ABNORMAL LOW (ref 36.0–46.0)
Hemoglobin: 10.8 g/dL — ABNORMAL LOW (ref 12.0–15.0)
Lymphocytes Relative: 43 % (ref 12–46)
Lymphs Abs: 2.2 10*3/uL (ref 0.7–4.0)
MCH: 23.7 pg — ABNORMAL LOW (ref 26.0–34.0)
MCHC: 32.3 g/dL (ref 30.0–36.0)
MCV: 73.4 fL — ABNORMAL LOW (ref 78.0–100.0)
Monocytes Absolute: 0.4 10*3/uL (ref 0.1–1.0)
Monocytes Relative: 8 % (ref 3–12)
Neutro Abs: 2.4 10*3/uL (ref 1.7–7.7)
Neutrophils Relative %: 47 % (ref 43–77)
Platelets: 224 10*3/uL (ref 150–400)
RBC: 4.55 MIL/uL (ref 3.87–5.11)
RDW: 16.4 % — ABNORMAL HIGH (ref 11.5–15.5)
WBC: 5 10*3/uL (ref 4.0–10.5)

## 2014-05-28 LAB — COMPREHENSIVE METABOLIC PANEL
ALT: 10 U/L (ref 0–35)
AST: 13 U/L (ref 0–37)
Albumin: 3.6 g/dL (ref 3.5–5.2)
Alkaline Phosphatase: 71 U/L (ref 39–117)
BUN: 15 mg/dL (ref 6–23)
CO2: 30 mEq/L (ref 19–32)
Calcium: 9 mg/dL (ref 8.4–10.5)
Chloride: 103 mEq/L (ref 96–112)
Creat: 0.67 mg/dL (ref 0.50–1.10)
Glucose, Bld: 96 mg/dL (ref 70–99)
Potassium: 3.6 mEq/L (ref 3.5–5.3)
Sodium: 139 mEq/L (ref 135–145)
Total Bilirubin: 0.3 mg/dL (ref 0.2–1.2)
Total Protein: 6.5 g/dL (ref 6.0–8.3)

## 2014-05-28 LAB — LIPASE: Lipase: 26 U/L (ref 0–75)

## 2014-05-28 MED ORDER — PANTOPRAZOLE SODIUM 40 MG PO TBEC: 40.0000 mg | DELAYED_RELEASE_TABLET | Freq: Every day | ORAL | Status: DC

## 2014-05-28 NOTE — Assessment & Plan Note (Signed)
No signs of infection Continue soaks Followup with podiatry, she is an established patient

## 2014-05-28 NOTE — Assessment & Plan Note (Addendum)
Continued recurrent abdominal pain and 80 year old lady status post CVA. Colonoscopy showed several adenomatous polyps Differential includes but not limited to PUD, mesenteric insufficiency, gallstones Check H. pylori and labs today Abdominal ultrasound Start Protonix 6 kg weight loss in last 7 months, Will continue to keep colon ca in mind Followup 2 weeks, we'll consider requesting evaluation from GI as she is a well-established patient may benefit from an EGD.

## 2014-05-28 NOTE — Progress Notes (Signed)
Patient ID: Linda Macias, female   DOB: 25-Jul-1923, 80 y.o.   MRN: [id number]  Kenn File, MD Phone: [phone]  Subjective:  Chief complaint-noted  Pt Here for followup of abdominal pain.  Patient states that it's been going on now for about 6 weeks. She did have constipation which is now resolved. Neurologic she feels is calling some of the abdominal discomfort but it has not gotten much better since stopping it. She now has one easy bowel movement per day when drinking cherry juice.  She states that she has a dull central to epigastric abdominal pain after anything that she. She states that all this started after a greasy meal but that she's avoided rich and greasy foods since. She has however had homemade chicken noodle soup.   He's had several colonoscopies in the past showing several adenomatous polyps by Dr. Cristina Gong.  She's having problems with her left ingrown toenail. They had a partial toenail removal is now going back take her and causing pain over the last 6-8 weeks. She notes some clear drainage of the area and states that the pain is improved. She's worried that she is an infection.  ROS-  Past Medical History Patient Active Problem List   Diagnosis Date Noted  . Abdominal pain, epigastric 05/28/2014  . Hearing loss 01/07/2014  . Cervical spondylosis without myelopathy 10/18/2013  . Ingrown toenail 10/04/2013  . Constipation 09/15/2012  . Well woman exam 02/15/2012  . Carpal tunnel syndrome 06/30/2011  . Vaginal pain 01/11/2011  . ANEMIA, SECONDARY TO ACUTE BLOOD LOSS 09/04/2009  . HIP JOINT REPLACEMENT BY OTHER MEANS 06/23/2009  . DEGENERATIVE JOINT DISEASE 04/23/2008  . OSTEOARTHROSIS UNSPEC WHETHER GEN/LOCALIZED HAND 01/05/2008  . ARTHRITIS, CERVICAL SPINE 03/09/2007  . COLON POLYP 12/29/2006  . HYPERCHOLESTEROLEMIA 12/29/2006  . OBESITY, NOS 12/29/2006  . HYPERTENSION, BENIGN SYSTEMIC 12/29/2006  . CVA 12/29/2006    Medications- reviewed and  updated Current Outpatient Prescriptions  Medication Sig Dispense Refill  . atenolol (TENORMIN) 50 MG tablet TAKE 1 TABLET BY MOUTH EVERY DAY  90 tablet  3  . chlorthalidone (HYGROTON) 25 MG tablet TAKE 1 TABLET DAILY  90 tablet  3  . desloratadine (CLARINEX) 5 MG tablet Take 1 tablet (5 mg total) by mouth daily.  30 tablet  11  . glycerin adult (GLYCERIN ADULT) 2 G SUPP Place 1 suppository rectally once as needed for moderate constipation.  3 suppository  0  . oxyCODONE-acetaminophen (ROXICET) 5-325 MG per tablet Take 1 tablet by mouth every 8 (eight) hours as needed for severe pain.  5 tablet  0  . pantoprazole (PROTONIX) 40 MG tablet Take 1 tablet (40 mg total) by mouth daily.  30 tablet  3  . PEG 3350-KCl-NaBcb-NaCl-NaSulf (PEG 3350/ELECTROLYTES) 240 G SOLR        No current facility-administered medications for this visit.    Objective: BP 145/76  Pulse 69  Temp(Src) 98.1 F (36.7 C) (Oral)  Ht 5\' 3"  (1.6 m)  Wt 148 lb 9.6 oz (67.405 kg)  BMI 26.33 kg/m2 Gen: NAD, alert, cooperative with exam HEENT: NCAT CV: RRR, good S1/S2, no murmur Resp: CTABL, no wheezes, non-labored Abd: Soft, tenderness to palpation to the epigastric area and central area, weakly positive Murphy's sign, no guarding Ext: No edema, left great toe with healing no signs of infection in other words no erythema, induration, or pain warmth of the toenail Neuro: Alert and oriented, No gross deficits   Assessment/Plan:  Ingrown toenail No signs of infection  Continue soaks Followup with podiatry, she is an established patient  Abdominal pain, epigastric Continued recurrent abdominal pain and 80 year old lady status post CVA. Colonoscopy showed several adenomatous polyps Differential includes but not limited to PUD, mesenteric insufficiency, gallstones Check H. pylori and labs today Abdominal ultrasound Start Protonix Followup 2 weeks, we'll consider requesting evaluation from GI as she is a  well-established patient may benefit from an EGD.     Orders Placed This Encounter  Procedures  . US Abdomen Complete    Standing Status: Future     Number of Occurrences:      Standing Expiration Date: 07/30/2015    Order Specific Question:  Reason for Exam (SYMPTOM  OR DIAGNOSIS REQUIRED)    Answer:  Abd pain with murphy sign    Order Specific Question:  Preferred imaging location?    Answer:  [facility]  . Comprehensive metabolic panel  . CBC with Differential    Meds ordered this encounter  Medications  . pantoprazole (PROTONIX) 40 MG tablet    Sig: Take 1 tablet (40 mg total) by mouth daily.    Dispense:  30 tablet    Refill:  3

## 2014-05-28 NOTE — Patient Instructions (Signed)
Great to see you!  We'll do some studies and labs and follow up in 2 weeks  I'll start protonix   Constipation Constipation is when a person:  Poops (has a bowel movement) less than 3 times a week.  Has a hard time pooping.  Has poop that is dry, hard, or bigger than normal. HOME CARE   Eat foods with a lot of fiber in them. This includes fruits, vegetables, beans, and whole grains such as brown rice.  Avoid fatty foods and foods with a lot of sugar. This includes french fries, hamburgers, cookies, candy, and soda.  If you are not getting enough fiber from food, take products with added fiber in them (supplements).  Drink enough fluid to keep your pee (urine) clear or pale yellow.  Exercise on a regular basis, or as told by your doctor.  Go to the restroom when you feel like you need to poop. Do not hold it.  Only take medicine as told by your doctor. Do not take medicines that help you poop (laxatives) without talking to your doctor first. GET HELP RIGHT AWAY IF:   You have bright red blood in your poop (stool).  Your constipation lasts more than 4 days or gets worse.  You have belly (abdominal) or butt (rectal) pain.  You have thin poop (as thin as a pencil).  You lose weight, and it cannot be explained. MAKE SURE YOU:   Understand these instructions.  Will watch your condition.  Will get help right away if you are not doing well or get worse. Document Released: 04/05/2008 Document Revised: 10/23/2013 Document Reviewed: 07/30/2013 [facility] Patient Information 2015 [place], [place]. This information is not intended to replace advice given to you by your health care provider. Make sure you discuss any questions you have with your health care provider.

## 2014-05-31 ENCOUNTER — Ambulatory Visit
Admission: RE | Admit: 2014-05-31 | Discharge: 2014-05-31 | Disposition: A | Discharge status: Home / Self Care | Payer: Medicare Other | Source: Ambulatory Visit | Attending: Family Medicine | Admitting: Family Medicine

## 2014-05-31 DIAGNOSIS — R1013 Epigastric pain: Secondary | ICD-10-CM

## 2014-06-04 ENCOUNTER — Telehealth: Payer: Self-pay | Admitting: Family Medicine

## 2014-06-04 NOTE — Telephone Encounter (Signed)
Pt called and would like someone from the [facility] to call her concerning an up coming procedure. jw

## 2014-06-04 NOTE — Telephone Encounter (Signed)
Informed patient of ultrasound results.

## 2014-06-06 ENCOUNTER — Telehealth: Payer: Self-pay | Admitting: Family Medicine

## 2014-06-06 DIAGNOSIS — R1013 Epigastric pain: Secondary | ICD-10-CM

## 2014-06-06 NOTE — Telephone Encounter (Signed)
Patient still with abdominal pain, constipation somewhat better. Will forward to PCP.

## 2014-06-06 NOTE — Telephone Encounter (Signed)
Pt called and would like Dr. Wendi Snipes to get her an emergency appointment with her other doctor for her stomach. jw

## 2014-06-06 NOTE — Telephone Encounter (Signed)
She woul dlike to see her GI doctor, I believe she is established with Dr. Delma Officer but will write a referral so as to not slow down the process if she needs a new one.   Laroy Apple, MD [place] Resident, PGY-3 06/06/2014, 4:56 PM

## 2014-06-07 ENCOUNTER — Emergency Department (HOSPITAL_COMMUNITY): Payer: Medicare Other

## 2014-06-07 ENCOUNTER — Emergency Department (HOSPITAL_COMMUNITY)
Admission: EM | Admit: 2014-06-07 | Discharge: 2014-06-07 | Disposition: A | Discharge status: Home / Self Care | Payer: Medicare Other | Attending: Emergency Medicine | Admitting: Emergency Medicine

## 2014-06-07 ENCOUNTER — Encounter (HOSPITAL_COMMUNITY): Payer: Self-pay | Admitting: Emergency Medicine

## 2014-06-07 DIAGNOSIS — I10 Essential (primary) hypertension: Secondary | ICD-10-CM | POA: Insufficient documentation

## 2014-06-07 DIAGNOSIS — R1013 Epigastric pain: Secondary | ICD-10-CM | POA: Diagnosis present

## 2014-06-07 DIAGNOSIS — Z8639 Personal history of other endocrine, nutritional and metabolic disease: Secondary | ICD-10-CM | POA: Insufficient documentation

## 2014-06-07 DIAGNOSIS — Z862 Personal history of diseases of the blood and blood-forming organs and certain disorders involving the immune mechanism: Secondary | ICD-10-CM | POA: Diagnosis not present

## 2014-06-07 DIAGNOSIS — Z8673 Personal history of transient ischemic attack (TIA), and cerebral infarction without residual deficits: Secondary | ICD-10-CM | POA: Diagnosis not present

## 2014-06-07 DIAGNOSIS — Z8739 Personal history of other diseases of the musculoskeletal system and connective tissue: Secondary | ICD-10-CM | POA: Insufficient documentation

## 2014-06-07 DIAGNOSIS — Z79899 Other long term (current) drug therapy: Secondary | ICD-10-CM | POA: Diagnosis not present

## 2014-06-07 DIAGNOSIS — N390 Urinary tract infection, site not specified: Secondary | ICD-10-CM

## 2014-06-07 LAB — CBC WITH DIFFERENTIAL/PLATELET
Basophils Absolute: 0 10*3/uL (ref 0.0–0.1)
Basophils Relative: 0 % (ref 0–1)
Eosinophils Absolute: 0.1 10*3/uL (ref 0.0–0.7)
Eosinophils Relative: 2 % (ref 0–5)
HCT: 34.9 % — ABNORMAL LOW (ref 36.0–46.0)
Hemoglobin: 11.3 g/dL — ABNORMAL LOW (ref 12.0–15.0)
Lymphocytes Relative: 37 % (ref 12–46)
Lymphs Abs: 1.9 10*3/uL (ref 0.7–4.0)
MCH: 23.2 pg — ABNORMAL LOW (ref 26.0–34.0)
MCHC: 32.4 g/dL (ref 30.0–36.0)
MCV: 71.5 fL — ABNORMAL LOW (ref 78.0–100.0)
Monocytes Absolute: 0.5 10*3/uL (ref 0.1–1.0)
Monocytes Relative: 9 % (ref 3–12)
Neutro Abs: 2.6 10*3/uL (ref 1.7–7.7)
Neutrophils Relative %: 52 % (ref 43–77)
Platelets: 213 10*3/uL (ref 150–400)
RBC: 4.88 MIL/uL (ref 3.87–5.11)
RDW: 14.5 % (ref 11.5–15.5)
WBC: 5.1 10*3/uL (ref 4.0–10.5)

## 2014-06-07 LAB — URINALYSIS, ROUTINE W REFLEX MICROSCOPIC
Bilirubin Urine: NEGATIVE
Glucose, UA: NEGATIVE mg/dL
Ketones, ur: NEGATIVE mg/dL
Nitrite: POSITIVE — AB
Protein, ur: NEGATIVE mg/dL
Specific Gravity, Urine: 1.01 (ref 1.005–1.030)
Urobilinogen, UA: 0.2 mg/dL (ref 0.0–1.0)
pH: 7.5 (ref 5.0–8.0)

## 2014-06-07 LAB — I-STAT CG4 LACTIC ACID, ED: Lactic Acid, Venous: 0.98 mmol/L (ref 0.5–2.2)

## 2014-06-07 LAB — COMPREHENSIVE METABOLIC PANEL
ALT: 12 U/L (ref 0–35)
AST: 16 U/L (ref 0–37)
Albumin: 3.5 g/dL (ref 3.5–5.2)
Alkaline Phosphatase: 84 U/L (ref 39–117)
Anion gap: 12 (ref 5–15)
BUN: 14 mg/dL (ref 6–23)
CO2: 25 mEq/L (ref 19–32)
Calcium: 9.4 mg/dL (ref 8.4–10.5)
Chloride: 102 mEq/L (ref 96–112)
Creatinine, Ser: 0.7 mg/dL (ref 0.50–1.10)
GFR calc Af Amer: 87 mL/min — ABNORMAL LOW (ref >90)
GFR calc non Af Amer: 75 mL/min — ABNORMAL LOW (ref >90)
Glucose, Bld: 123 mg/dL — ABNORMAL HIGH (ref 70–99)
Potassium: 3.8 mEq/L (ref 3.7–5.3)
Sodium: 139 mEq/L (ref 137–147)
Total Bilirubin: 0.5 mg/dL (ref 0.3–1.2)
Total Protein: 7.2 g/dL (ref 6.0–8.3)

## 2014-06-07 LAB — URINE MICROSCOPIC-ADD ON

## 2014-06-07 LAB — LIPASE, BLOOD: Lipase: 27 U/L (ref 11–59)

## 2014-06-07 MED ORDER — DEXTROSE 5 % IV SOLN
1.0000 g | Freq: Once | INTRAVENOUS | Status: AC
  Administered 2014-06-07: 1 g via INTRAVENOUS
  Filled 2014-06-07: qty 10

## 2014-06-07 MED ORDER — HYDROCODONE-ACETAMINOPHEN 5-325 MG PO TABS: 1.0000 | ORAL_TABLET | Freq: Four times a day (QID) | ORAL | Status: DC | PRN

## 2014-06-07 MED ORDER — ONDANSETRON 4 MG PO TBDP: 4.0000 mg | ORAL_TABLET | Freq: Once | ORAL | Status: DC

## 2014-06-07 MED ORDER — ONDANSETRON HCL 4 MG/2ML IJ SOLN
4.0000 mg | Freq: Once | INTRAMUSCULAR | Status: AC
  Administered 2014-06-07: 4 mg via INTRAVENOUS
  Filled 2014-06-07: qty 2

## 2014-06-07 MED ORDER — CEPHALEXIN 500 MG PO CAPS: 500.0000 mg | ORAL_CAPSULE | Freq: Two times a day (BID) | ORAL | Status: DC

## 2014-06-07 MED ORDER — SODIUM CHLORIDE 0.9 % IV BOLUS (SEPSIS)
1000.0000 mL | Freq: Once | INTRAVENOUS | Status: AC
  Administered 2014-06-07: 1000 mL via INTRAVENOUS

## 2014-06-07 MED ORDER — GI COCKTAIL ~~LOC~~
30.0000 mL | Freq: Once | ORAL | Status: AC
  Administered 2014-06-07: 30 mL via ORAL
  Filled 2014-06-07: qty 30

## 2014-06-07 NOTE — ED Notes (Signed)
Pt continues to be monitored by 12 lead, blood pressure, and pulse ox. Pt family at bedside.

## 2014-06-07 NOTE — Discharge Instructions (Signed)
Your labs and x-rays today were normal. I discussed her care with Dr. Cristina Gong who has recommended you have an outpatient CT of your abdomen and pelvis (CT angio of the mesenteric arteries).  He will try to get you in to his office sooner. Please continue your protonix.  You also have a urinary tract infection that we are treating with Keflex.    Abdominal Pain, Women Abdominal (stomach, pelvic, or belly) pain can be caused by many things. It is important to tell your doctor:  The location of the pain.  Does it come and go or is it present all the time?  Are there things that start the pain (eating certain foods, exercise)?  Are there other symptoms associated with the pain (fever, nausea, vomiting, diarrhea)? All of this is helpful to know when trying to find the cause of the pain. CAUSES   Stomach: virus or bacteria infection, or ulcer.  Intestine: appendicitis (inflamed appendix), regional ileitis (Crohn's disease), ulcerative colitis (inflamed colon), irritable bowel syndrome, diverticulitis (inflamed diverticulum of the colon), or cancer of the stomach or intestine.  Gallbladder disease or stones in the gallbladder.  Kidney disease, kidney stones, or infection.  Pancreas infection or cancer.  Fibromyalgia (pain disorder).  Diseases of the female organs:  Uterus: fibroid (non-cancerous) tumors or infection.  Fallopian tubes: infection or tubal pregnancy.  Ovary: cysts or tumors.  Pelvic adhesions (scar tissue).  Endometriosis (uterus lining tissue growing in the pelvis and on the pelvic organs).  Pelvic congestion syndrome (female organs filling up with blood just before the menstrual period).  Pain with the menstrual period.  Pain with ovulation (producing an egg).  Pain with an IUD (intrauterine device, birth control) in the uterus.  Cancer of the female organs.  Functional pain (pain not caused by a disease, may improve without treatment).  Psychological  pain.  Depression. DIAGNOSIS  Your doctor will decide the seriousness of your pain by doing an examination.  Blood tests.  X-rays.  Ultrasound.  CT scan (computed tomography, special type of X-ray).  MRI (magnetic resonance imaging).  Cultures, for infection.  Barium enema (dye inserted in the large intestine, to better view it with X-rays).  Colonoscopy (looking in intestine with a lighted tube).  Laparoscopy (minor surgery, looking in abdomen with a lighted tube).  Major abdominal exploratory surgery (looking in abdomen with a large incision). TREATMENT  The treatment will depend on the cause of the pain.   Many cases can be observed and treated at home.  Over-the-counter medicines recommended by your caregiver.  Prescription medicine.  Antibiotics, for infection.  Birth control pills, for painful periods or for ovulation pain.  Hormone treatment, for endometriosis.  Nerve blocking injections.  Physical therapy.  Antidepressants.  Counseling with a psychologist or psychiatrist.  Minor or major surgery. HOME CARE INSTRUCTIONS   Do not take laxatives, unless directed by your caregiver.  Take over-the-counter pain medicine only if ordered by your caregiver. Do not take aspirin because it can cause an upset stomach or bleeding.  Try a clear liquid diet (broth or water) as ordered by your caregiver. Slowly move to a bland diet, as tolerated, if the pain is related to the stomach or intestine.  Have a thermometer and take your temperature several times a day, and record it.  Bed rest and sleep, if it helps the pain.  Avoid sexual intercourse, if it causes pain.  Avoid stressful situations.  Keep your follow-up appointments and tests, as your caregiver orders.  If the pain does not go away with medicine or surgery, you may try:  Acupuncture.  Relaxation exercises (yoga, meditation).  Group therapy.  Counseling. SEEK MEDICAL CARE IF:   You  notice certain foods cause stomach pain.  Your home care treatment is not helping your pain.  You need stronger pain medicine.  You want your IUD removed.  You feel faint or lightheaded.  You develop nausea and vomiting.  You develop a rash.  You are having side effects or an allergy to your medicine. SEEK IMMEDIATE MEDICAL CARE IF:   Your pain does not go away or gets worse.  You have a fever.  Your pain is felt only in portions of the abdomen. The right side could possibly be appendicitis. The left lower portion of the abdomen could be colitis or diverticulitis.  You are passing blood in your stools (bright red or black tarry stools, with or without vomiting).  You have blood in your urine.  You develop chills, with or without a fever.  You pass out. MAKE SURE YOU:   Understand these instructions.  Will watch your condition.  Will get help right away if you are not doing well or get worse. Document Released: 08/15/2007 Document Revised: 03/04/2014 Document Reviewed: 09/04/2009 [facility] Patient Information 2015 [place], [place]. This information is not intended to replace advice given to you by your health care provider. Make sure you discuss any questions you have with your health care provider.  Urinary Tract Infection Urinary tract infections (UTIs) can develop anywhere along your urinary tract. Your urinary tract is your body's drainage system for removing wastes and extra water. Your urinary tract includes two kidneys, two ureters, a bladder, and a urethra. Your kidneys are a pair of bean-shaped organs. Each kidney is about the size of your fist. They are located below your ribs, one on each side of your spine. CAUSES Infections are caused by microbes, which are microscopic organisms, including fungi, viruses, and bacteria. These organisms are so small that they can only be seen through a microscope. Bacteria are the microbes that most commonly cause UTIs. SYMPTOMS    Symptoms of UTIs may vary by age and gender of the patient and by the location of the infection. Symptoms in young women typically include a frequent and intense urge to urinate and a painful, burning feeling in the bladder or urethra during urination. Older women and men are more likely to be tired, shaky, and weak and have muscle aches and abdominal pain. A fever may mean the infection is in your kidneys. Other symptoms of a kidney infection include pain in your back or sides below the ribs, nausea, and vomiting. DIAGNOSIS To diagnose a UTI, your caregiver will ask you about your symptoms. Your caregiver also will ask to provide a urine sample. The urine sample will be tested for bacteria and white blood cells. White blood cells are made by your body to help fight infection. TREATMENT  Typically, UTIs can be treated with medication. Because most UTIs are caused by a bacterial infection, they usually can be treated with the use of antibiotics. The choice of antibiotic and length of treatment depend on your symptoms and the type of bacteria causing your infection. HOME CARE INSTRUCTIONS  If you were prescribed antibiotics, take them exactly as your caregiver instructs you. Finish the medication even if you feel better after you have only taken some of the medication.  Drink enough water and fluids to keep your urine clear or  pale yellow.  Avoid caffeine, tea, and carbonated beverages. They tend to irritate your bladder.  Empty your bladder often. Avoid holding urine for long periods of time.  Empty your bladder before and after sexual intercourse.  After a bowel movement, women should cleanse from front to back. Use each tissue only once. SEEK MEDICAL CARE IF:   You have back pain.  You develop a fever.  Your symptoms do not begin to resolve within 3 days. SEEK IMMEDIATE MEDICAL CARE IF:   You have severe back pain or lower abdominal pain.  You develop chills.  You have nausea or  vomiting.  You have continued burning or discomfort with urination. MAKE SURE YOU:   Understand these instructions.  Will watch your condition.  Will get help right away if you are not doing well or get worse. Document Released: 07/28/2005 Document Revised: 04/18/2012 Document Reviewed: 11/26/2011 [facility] Patient Information 2015 [place], [place]. This information is not intended to replace advice given to you by your health care provider. Make sure you discuss any questions you have with your health care provider.

## 2014-06-07 NOTE — ED Notes (Signed)
Patient transported to X-ray 

## 2014-06-07 NOTE — ED Notes (Signed)
Pt sts she has been having abd pain after she eats or drinks anything. Pt sts if she drinks water pain is instant and foods it takes a few minutes. Pt has been taking prantoprozle,align, Mylanta per PCP. Pt sts she had an ultrasound done Friday and they did not find anything abnormal.

## 2014-06-07 NOTE — ED Notes (Signed)
Pt vitals were updated and iv removed pt is getting dressed and ready for discharge.

## 2014-06-07 NOTE — ED Notes (Signed)
Pt continues to be monitored by 5 lead, blood pressure, and pulse ox. Provided pt with warm blanket.

## 2014-06-07 NOTE — ED Notes (Signed)
MD at bedside. 

## 2014-06-07 NOTE — ED Provider Notes (Addendum)
TIME SEEN: 11:30 AM  CHIEF COMPLAINT: Abdominal pain  HPI: Patient is a 80 -year-old pleasant female with history of hypertension, hyperlipidemia, CVA who presents the emergency department with epigastric pain that has been intermittent and described as a sharp, uncomfortable feeling after eating since May 26. Patient denies any fevers, chills, vomiting or diarrhea, bloody stool or melena. She states she has never had an endoscopy. Her last colonoscopy was by Dr. Cristina Gong a year ago. She states she had an abdominal ultrasound July 31 that was normal. She states that she had an episode of pain last night that lasted several hours and was the worst pain she has experienced and that is why she is in the ER today. She states that she does not have pain currently only when she is lying flat. Granddaughter at bedside reports she has lost 20 pounds in the past 2 months secondary to this.  ROS: See HPI Constitutional: no fever  Eyes: no drainage  ENT: no runny nose   Cardiovascular:  no chest pain  Resp: no SOB  GI: no vomiting GU: no dysuria Integumentary: no rash  Allergy: no hives  Musculoskeletal: no leg swelling  Neurological: no slurred speech ROS otherwise negative  PAST MEDICAL HISTORY/PAST SURGICAL HISTORY:  Past Medical History  Diagnosis Date  . Hyperlipidemia   . Hypertension   . Osteoarthritis, knee/lower leg   . CVA (cerebral infarction)     No residuals, cannot tolerate statins   . Arthritis     MEDICATIONS:  Prior to Admission medications   Medication Sig Start Date End Date Taking? Authorizing Provider  aluminum & magnesium hydroxide-simethicone (MYLANTA) 500-450-40 MG/5ML suspension Take 30 mLs by mouth every 6 (six) hours as needed for indigestion.   Yes Historical Provider, MD  atenolol (TENORMIN) 50 MG tablet Take 50 mg by mouth daily.   Yes Historical Provider, MD  chlorthalidone (HYGROTON) 25 MG tablet Take 25 mg by mouth daily.   Yes Historical Provider, MD   glycerin adult (GLYCERIN ADULT) 2 G SUPP Place 1 suppository rectally once as needed for moderate constipation. 05/07/14  Yes Alexander Parks Ranger, DO  pantoprazole (PROTONIX) 40 MG tablet Take 1 tablet (40 mg total) by mouth daily. 05/28/14  Yes Timmothy Euler, MD  Polyethyl Glycol-Propyl Glycol (SYSTANE OP) Place 1 drop into both eyes as needed (for dry or burning).   Yes Historical Provider, MD  Probiotic Product (ALIGN) 4 MG CAPS Take 4 mg by mouth daily.   Yes Historical Provider, MD    ALLERGIES:  Allergies  Allergen Reactions  . Ciprocin-Fluocin-Procin [Fluocinolone Acetonide] Other (See Comments)    Diarrhea for 37 days  . Ciprofloxacin Other (See Comments)    Diarrhea for 37 days    SOCIAL HISTORY:  History  Substance Use Topics  . Smoking status: Never Smoker   . Smokeless tobacco: Never Used  . Alcohol Use: No    FAMILY HISTORY: Family History  Problem Relation Age of Onset  . Cancer Mother   . Heart disease Mother   . Cancer Sister     esophagus  . Cancer Sister     colon  . Cancer Sister     abdomin    EXAM: BP 133/75  Pulse 65  Temp(Src) 97.8 F (36.6 C)  Resp 18  Ht 5\' 3"  (1.6 m)  Wt 148 lb (67.132 kg)  BMI 26.22 kg/m2  SpO2 100% CONSTITUTIONAL: Alert and oriented and responds appropriately to questions. Well-appearing; well-nourished HEAD: Normocephalic EYES: Conjunctivae clear, PERRL  ENT: normal nose; no rhinorrhea; moist mucous membranes; pharynx without lesions noted NECK: Supple, no meningismus, no LAD  CARD: RRR; S1 and S2 appreciated; no murmurs, no clicks, no rubs, no gallops RESP: Normal chest excursion without splinting or tachypnea; breath sounds clear and equal bilaterally; no wheezes, no rhonchi, no rales,  ABD/GI: Normal bowel sounds; non-distended; soft, non-tender, no rebound, no guarding, negative Murphy sign, no tenderness at McBurney's point, no peritoneal signs BACK:  The back appears normal and is non-tender to palpation,  there is no CVA tenderness EXT: Normal ROM in all joints; non-tender to palpation; no edema; normal capillary refill; no cyanosis    SKIN: Normal color for age and race; warm NEURO: Moves all extremities equally PSYCH: The patient's mood and manner are appropriate. Grooming and personal hygiene are appropriate.  MEDICAL DECISION MAKING: Patient is a very pleasant, well-appearing female with epigastric pain worse after eating. Differential diagnosis includes GERD, gastritis, peptic ulcer, less likely mesenteric ischemia, colitis. She's had a normal ultrasound of her gallbladder other than gallbladder polyps. Will obtain abdominal labs, urine, abdominal x-ray. We'll give GI cocktail and reassess. Family reports she is currently taking protonix.  ED PROGRESS: Patient's labs, x-ray unremarkable. She is still hemodynamically stable, pleasant and in no distress with a benign abdominal exam. Discussed with Dr. Cristina Gong with gastroenterology in 2 feels this may be intestinal angina. He feel she will need an outpatient CT angiogram mesenteric arteries but this does not need to be done emergently especially given her normal vitals, benign exam, normal lactate and no leukocytosis. He recommends eating small frequent meals that are high in protein. He will try to get the patient in to be seen sooner. Patient is comfortable with plan for discharge home. Discussed return precautions and supportive care instructions. Will discharge with prescription for nausea and pain medication.   Patient appears to have a nitrite-positive UTI. Culture pending. We'll give ceftriaxone in ED. We'll discharge home on Keflex.     [place], DO 06/07/14 [place], DO 06/07/14 [id number]

## 2014-06-09 LAB — URINE CULTURE: Colony Count: >=100000

## 2014-06-10 ENCOUNTER — Telehealth: Payer: Self-pay | Admitting: Family Medicine

## 2014-06-10 NOTE — Telephone Encounter (Signed)
Pt called and wanted to know if her Xray results have come back? She wanted the doctor to also know that she is still having pain when she eats. jw

## 2014-06-10 NOTE — ED Notes (Signed)
Post ED Visit - Positive Culture Follow-up  Culture report reviewed by antimicrobial stewardship pharmacist: []  Wes Lattimer, Pharm.D., BCPS []  Heide Guile, Pharm.D., BCPS []  Alycia Rossetti, Pharm.D., BCPS []  [place], [place].D., BCPS, AAHIVP []  Legrand Como, Pharm.D., BCPS, AAHIVP []  Hassie Bruce, Pharm.D. [x]  Milus Glazier, Pharm.D.  Positive urine culture Treated with cephalexin, organism sensitive to the same and no further patient follow-up is required at this time.  Ileene Musa 06/10/2014, 1:57 PM

## 2014-06-11 NOTE — Telephone Encounter (Signed)
Pt is aware of results.  She is wanting to only see you for a follow up.  Informed her that you didn't have anything open for a couple of weeks.  Is there a day that you can squeeze her in?  [employer], CMA

## 2014-06-11 NOTE — Telephone Encounter (Signed)
Will ask her to come in for OV. No appt scheduled currently. Her xray is normal except for a moderate stool burden, so encourage fiber and or miralax use.   Laroy Apple, MD [place] Resident, PGY-3 06/11/2014, 8:37 AM

## 2014-06-12 ENCOUNTER — Other Ambulatory Visit: Payer: Self-pay | Admitting: Gastroenterology

## 2014-06-12 DIAGNOSIS — R1011 Right upper quadrant pain: Secondary | ICD-10-CM

## 2014-06-12 DIAGNOSIS — R634 Abnormal weight loss: Secondary | ICD-10-CM

## 2014-06-14 ENCOUNTER — Ambulatory Visit
Admission: RE | Admit: 2014-06-14 | Discharge: 2014-06-14 | Disposition: A | Discharge status: Home / Self Care | Payer: Medicare Other | Source: Ambulatory Visit | Attending: Gastroenterology | Admitting: Gastroenterology

## 2014-06-14 ENCOUNTER — Ambulatory Visit: Payer: Medicare Other | Admitting: Family Medicine

## 2014-06-14 DIAGNOSIS — R634 Abnormal weight loss: Secondary | ICD-10-CM

## 2014-06-14 DIAGNOSIS — R1011 Right upper quadrant pain: Secondary | ICD-10-CM

## 2014-06-14 MED ORDER — IOHEXOL 300 MG/ML  SOLN
100.0000 mL | Freq: Once | INTRAMUSCULAR | Status: AC | PRN
  Administered 2014-06-14: 100 mL via INTRAVENOUS

## 2014-06-17 ENCOUNTER — Other Ambulatory Visit (HOSPITAL_COMMUNITY): Payer: Self-pay | Admitting: Gastroenterology

## 2014-06-17 DIAGNOSIS — R1900 Intra-abdominal and pelvic swelling, mass and lump, unspecified site: Secondary | ICD-10-CM

## 2014-06-17 NOTE — Telephone Encounter (Signed)
Left message on vm for patient to call back ans schedule an appointment.

## 2014-06-21 ENCOUNTER — Encounter (HOSPITAL_COMMUNITY): Payer: Self-pay

## 2014-06-21 ENCOUNTER — Ambulatory Visit (HOSPITAL_COMMUNITY)
Admission: RE | Admit: 2014-06-21 | Discharge: 2014-06-21 | Disposition: A | Discharge status: Home / Self Care | Payer: Medicare Other | Source: Ambulatory Visit | Attending: Gastroenterology | Admitting: Gastroenterology

## 2014-06-21 DIAGNOSIS — R1900 Intra-abdominal and pelvic swelling, mass and lump, unspecified site: Secondary | ICD-10-CM | POA: Insufficient documentation

## 2014-06-21 DIAGNOSIS — K769 Liver disease, unspecified: Secondary | ICD-10-CM | POA: Insufficient documentation

## 2014-06-21 DIAGNOSIS — I7090 Unspecified atherosclerosis: Secondary | ICD-10-CM | POA: Diagnosis not present

## 2014-06-21 LAB — GLUCOSE, CAPILLARY: Glucose-Capillary: 114 mg/dL — ABNORMAL HIGH (ref 70–99)

## 2014-06-21 MED ORDER — FLUDEOXYGLUCOSE F - 18 (FDG) INJECTION: 7.2700 | Freq: Once | INTRAVENOUS | Status: AC | PRN

## 2014-06-26 ENCOUNTER — Other Ambulatory Visit: Payer: Self-pay | Admitting: *Deleted

## 2014-06-26 DIAGNOSIS — R1013 Epigastric pain: Secondary | ICD-10-CM

## 2014-06-26 MED ORDER — PANTOPRAZOLE SODIUM 40 MG PO TBEC: 40.0000 mg | DELAYED_RELEASE_TABLET | Freq: Every day | ORAL | Status: DC

## 2014-06-26 NOTE — Telephone Encounter (Signed)
Request for 90 day supply. Martin, Tamika L, RN  

## 2014-07-10 ENCOUNTER — Emergency Department (HOSPITAL_COMMUNITY)
Admission: EM | Admit: 2014-07-10 | Discharge: 2014-07-10 | Disposition: A | Discharge status: Home / Self Care | Payer: Medicare Other | Attending: Emergency Medicine | Admitting: Emergency Medicine

## 2014-07-10 ENCOUNTER — Encounter (HOSPITAL_COMMUNITY): Payer: Self-pay | Admitting: Emergency Medicine

## 2014-07-10 DIAGNOSIS — R1013 Epigastric pain: Secondary | ICD-10-CM | POA: Diagnosis not present

## 2014-07-10 DIAGNOSIS — I10 Essential (primary) hypertension: Secondary | ICD-10-CM | POA: Insufficient documentation

## 2014-07-10 DIAGNOSIS — R1012 Left upper quadrant pain: Secondary | ICD-10-CM | POA: Diagnosis not present

## 2014-07-10 DIAGNOSIS — Z8739 Personal history of other diseases of the musculoskeletal system and connective tissue: Secondary | ICD-10-CM | POA: Diagnosis not present

## 2014-07-10 DIAGNOSIS — E876 Hypokalemia: Secondary | ICD-10-CM

## 2014-07-10 DIAGNOSIS — Z79899 Other long term (current) drug therapy: Secondary | ICD-10-CM | POA: Insufficient documentation

## 2014-07-10 DIAGNOSIS — R5381 Other malaise: Secondary | ICD-10-CM | POA: Diagnosis present

## 2014-07-10 DIAGNOSIS — R1011 Right upper quadrant pain: Secondary | ICD-10-CM | POA: Diagnosis not present

## 2014-07-10 DIAGNOSIS — R197 Diarrhea, unspecified: Secondary | ICD-10-CM | POA: Insufficient documentation

## 2014-07-10 DIAGNOSIS — Z8673 Personal history of transient ischemic attack (TIA), and cerebral infarction without residual deficits: Secondary | ICD-10-CM | POA: Insufficient documentation

## 2014-07-10 DIAGNOSIS — R5383 Other fatigue: Secondary | ICD-10-CM

## 2014-07-10 LAB — COMPREHENSIVE METABOLIC PANEL
ALT: 17 U/L (ref 0–35)
AST: 17 U/L (ref 0–37)
Albumin: 3.2 g/dL — ABNORMAL LOW (ref 3.5–5.2)
Alkaline Phosphatase: 82 U/L (ref 39–117)
Anion gap: 12 (ref 5–15)
BUN: 12 mg/dL (ref 6–23)
CO2: 27 mEq/L (ref 19–32)
Calcium: 9.4 mg/dL (ref 8.4–10.5)
Chloride: 100 mEq/L (ref 96–112)
Creatinine, Ser: 0.71 mg/dL (ref 0.50–1.10)
GFR calc Af Amer: 87 mL/min — ABNORMAL LOW (ref >90)
GFR calc non Af Amer: 75 mL/min — ABNORMAL LOW (ref >90)
Glucose, Bld: 112 mg/dL — ABNORMAL HIGH (ref 70–99)
Potassium: 3.3 mEq/L — ABNORMAL LOW (ref 3.7–5.3)
Sodium: 139 mEq/L (ref 137–147)
Total Bilirubin: 0.4 mg/dL (ref 0.3–1.2)
Total Protein: 6.7 g/dL (ref 6.0–8.3)

## 2014-07-10 LAB — CBC WITH DIFFERENTIAL/PLATELET
Basophils Absolute: 0 10*3/uL (ref 0.0–0.1)
Basophils Relative: 0 % (ref 0–1)
Eosinophils Absolute: 0 10*3/uL (ref 0.0–0.7)
Eosinophils Relative: 1 % (ref 0–5)
HCT: 34.8 % — ABNORMAL LOW (ref 36.0–46.0)
Hemoglobin: 11.5 g/dL — ABNORMAL LOW (ref 12.0–15.0)
Lymphocytes Relative: 40 % (ref 12–46)
Lymphs Abs: 1.9 10*3/uL (ref 0.7–4.0)
MCH: 24 pg — ABNORMAL LOW (ref 26.0–34.0)
MCHC: 33 g/dL (ref 30.0–36.0)
MCV: 72.7 fL — ABNORMAL LOW (ref 78.0–100.0)
Monocytes Absolute: 0.4 10*3/uL (ref 0.1–1.0)
Monocytes Relative: 8 % (ref 3–12)
Neutro Abs: 2.5 10*3/uL (ref 1.7–7.7)
Neutrophils Relative %: 51 % (ref 43–77)
Platelets: 195 10*3/uL (ref 150–400)
RBC: 4.79 MIL/uL (ref 3.87–5.11)
RDW: 14.1 % (ref 11.5–15.5)
WBC: 4.8 10*3/uL (ref 4.0–10.5)

## 2014-07-10 LAB — URINALYSIS, ROUTINE W REFLEX MICROSCOPIC
Bilirubin Urine: NEGATIVE
Glucose, UA: NEGATIVE mg/dL
Hgb urine dipstick: NEGATIVE
Ketones, ur: NEGATIVE mg/dL
Nitrite: POSITIVE — AB
Protein, ur: NEGATIVE mg/dL
Specific Gravity, Urine: 1.017 (ref 1.005–1.030)
Urobilinogen, UA: 1 mg/dL (ref 0.0–1.0)
pH: 8 (ref 5.0–8.0)

## 2014-07-10 LAB — SAMPLE TO BLOOD BANK

## 2014-07-10 LAB — LIPASE, BLOOD: Lipase: 16 U/L (ref 11–59)

## 2014-07-10 LAB — URINE MICROSCOPIC-ADD ON

## 2014-07-10 MED ORDER — FENTANYL CITRATE 0.05 MG/ML IJ SOLN
50.0000 ug | Freq: Once | INTRAMUSCULAR | Status: AC
  Administered 2014-07-10: 50 ug via INTRAVENOUS
  Filled 2014-07-10: qty 2

## 2014-07-10 MED ORDER — PANTOPRAZOLE SODIUM 40 MG IV SOLR
40.0000 mg | Freq: Once | INTRAVENOUS | Status: AC
  Administered 2014-07-10: 40 mg via INTRAVENOUS
  Filled 2014-07-10: qty 40

## 2014-07-10 MED ORDER — POTASSIUM CHLORIDE CRYS ER 20 MEQ PO TBCR
40.0000 meq | EXTENDED_RELEASE_TABLET | Freq: Once | ORAL | Status: AC
  Administered 2014-07-10: 40 meq via ORAL
  Filled 2014-07-10: qty 2

## 2014-07-10 MED ORDER — POTASSIUM CHLORIDE CRYS ER 20 MEQ PO TBCR: 20.0000 meq | EXTENDED_RELEASE_TABLET | Freq: Two times a day (BID) | ORAL | Status: DC

## 2014-07-10 MED ORDER — SODIUM CHLORIDE 0.9 % IV BOLUS (SEPSIS)
1000.0000 mL | Freq: Once | INTRAVENOUS | Status: AC
  Administered 2014-07-10: 1000 mL via INTRAVENOUS

## 2014-07-10 MED ORDER — ONDANSETRON HCL 4 MG/2ML IJ SOLN
4.0000 mg | Freq: Once | INTRAMUSCULAR | Status: AC
  Administered 2014-07-10: 4 mg via INTRAVENOUS
  Filled 2014-07-10: qty 2

## 2014-07-10 NOTE — ED Notes (Signed)
Pt to ED via private vehicle with c/o generalized weakness, after having diarrhea since Sunday. Had a bowel obstruction recently (per patient) and saw Dr. Cristina Gong for GI issues. Started on Miralax recently, stopped taking it Sunday when diarrhea started.

## 2014-07-10 NOTE — ED Provider Notes (Signed)
CSN: [id number]     Arrival date & time 07/10/14  0907 History   First MD Initiated Contact with Patient 07/10/14 [phone]     Chief Complaint  Patient presents with  . Weakness  . Diarrhea     (Consider location/radiation/quality/duration/timing/severity/associated sxs/prior Treatment) HPI Patient reports she started getting abdominal pain in May that started after eating [employer] fried chicken. She went to see her PCP and was disimpacted when she had a fecal ball in her rectum. She however has continued to have left upper quadrant pain. She states the pain starts pretty immediately after eating. She feels like her food gets stuck in the LUQ. She is taking MiraLAX daily until 3 days ago when she started having diarrhea. She states she's having 1-10 loose stools a day. She's also been on probiotics. She denies any nausea, vomiting, or fever with the discomfort. She denies any rectal bleeding or melena. She states last night she had diffuse abdominal pain that was the worst it has ever been. She states now her pain is mainly epigastric and is "growling like gas talking". She rates her current pain as an 8/10. Pt states the worst abdominal pain she can have is hunger pains and " I am not going to die hungry". Patient is smiling and having a calm conversation with me. Patient has been seeing Dr. Cristina Gong, gastroenterologist. She states she has had yearly colonoscopies done and he has to shave off benign polyps. She was offered to have a partial colectomy however she would then require a colostomy which she has refused. She has never had an endoscopy done and she scheduled for endoscopy and colonoscopy on September 22 to further if I wait the pain she's currently having. Patient reports she had abdominal CT scan already done.  PCP Dr Wendi Snipes [facility] Dr Cristina Gong  Past Medical History  Diagnosis Date  . Hyperlipidemia   . Hypertension   . Osteoarthritis, knee/lower leg   . CVA (cerebral infarction)    No residuals, cannot tolerate statins   . Arthritis    Past Surgical History  Procedure Laterality Date  . Total hip arthroplasty  10/10   Family History  Problem Relation Age of Onset  . Cancer Mother   . Heart disease Mother   . Cancer Sister     esophagus  . Cancer Sister     colon  . Cancer Sister     abdomin   History  Substance Use Topics  . Smoking status: Never Smoker   . Smokeless tobacco: Never Used  . Alcohol Use: No   Lives at home  OB History   Grav Para Term Preterm Abortions TAB SAB Ect Mult Living                 Review of Systems  All other systems reviewed and are negative.     Allergies  Ciprocin-fluocin-procin and Ciprofloxacin  Home Medications   Prior to Admission medications   Medication Sig Start Date End Date Taking? Authorizing Provider  aluminum & magnesium hydroxide-simethicone (MYLANTA) 500-450-40 MG/5ML suspension Take 30 mLs by mouth every 6 (six) hours as needed for indigestion.   Yes Historical Provider, MD  atenolol (TENORMIN) 50 MG tablet Take 50 mg by mouth daily.   Yes Historical Provider, MD  HYDROcodone-acetaminophen (NORCO/VICODIN) 5-325 MG per tablet Take 1 tablet by mouth every 6 (six) hours as needed for moderate pain. 06/07/14  Yes Kristen N Ward, DO  pantoprazole (PROTONIX) 40 MG tablet Take 1 tablet (  40 mg total) by mouth daily. 06/26/14  Yes Timmothy Euler, MD  Polyethyl Glycol-Propyl Glycol (SYSTANE OP) Place 1 drop into both eyes as needed (for dry or burning).   Yes Historical Provider, MD  polyethylene glycol (MIRALAX / GLYCOLAX) packet Take 17 g by mouth daily as needed for mild constipation.   Yes Historical Provider, MD  Probiotic Product (ALIGN) 4 MG CAPS Take 4 mg by mouth daily.   Yes Historical Provider, MD   BP 144/92  Pulse 65  Temp(Src) 99 F (37.2 C) (Oral)  Resp 18  SpO2 100%  Vital signs normal   Physical Exam  Nursing note and vitals reviewed. Constitutional: She is oriented to person,  place, and time. She appears well-developed and well-nourished.  Non-toxic appearance. She does not appear ill. No distress.  HENT:  Head: Normocephalic and atraumatic.  Right Ear: External ear normal.  Left Ear: External ear normal.  Nose: Nose normal. No mucosal edema or rhinorrhea.  Mouth/Throat: Oropharynx is clear and moist and mucous membranes are normal. No dental abscesses or uvula swelling.  Eyes: Conjunctivae and EOM are normal. Pupils are equal, round, and reactive to light.  Pale conjunctiva  Neck: Normal range of motion and full passive range of motion without pain. Neck supple.  Cardiovascular: Normal rate, regular rhythm and normal heart sounds.  Exam reveals no gallop and no friction rub.   No murmur heard. Pulmonary/Chest: Effort normal and breath sounds normal. No respiratory distress. She has no wheezes. She has no rhonchi. She has no rales. She exhibits no tenderness and no crepitus.  Abdominal: Soft. Normal appearance and bowel sounds are normal. She exhibits no distension. There is tenderness in the right upper quadrant and epigastric area. There is no rebound and no guarding.    Very tender in the epigastric area and mildly in the right upper quadrant  Musculoskeletal: Normal range of motion. She exhibits no edema and no tenderness.  Moves all extremities well.   Neurological: She is alert and oriented to person, place, and time. She has normal strength. No cranial nerve deficit.  Skin: Skin is warm, dry and intact. No rash noted. No erythema. No pallor.  Psychiatric: She has a normal mood and affect. Her speech is normal and behavior is normal. Her mood appears not anxious.    ED Course  Procedures (including critical care time)  Medications  sodium chloride 0.9 % bolus 1,000 mL (1,000 mLs Intravenous New Bag/Given 07/10/14 1318)  pantoprazole (PROTONIX) injection 40 mg (40 mg Intravenous Given 07/10/14 1142)  ondansetron (ZOFRAN) injection 4 mg (4 mg Intravenous  Given 07/10/14 1134)  fentaNYL (SUBLIMAZE) injection 50 mcg (50 mcg Intravenous Given 07/10/14 1136)  sodium chloride 0.9 % bolus 1,000 mL (0 mLs Intravenous Stopped 07/10/14 1313)  potassium chloride SA (K-DUR,KLOR-CON) CR tablet 40 mEq (40 mEq Oral Given 07/10/14 1316)   13:33 feeling better, wanting to eat. Pt started on potassium for her hypokalemia. Pt states protonix is what usually makes her pain get better.  C/O bloating that she takes medications for.   Labs Review Results for orders placed during the hospital encounter of 07/10/14  CBC WITH DIFFERENTIAL      Result Value Ref Range   WBC 4.8  4.0 - 10.5 K/uL   RBC 4.79  3.87 - 5.11 MIL/uL   Hemoglobin 11.5 (*) 12.0 - 15.0 g/dL   HCT 34.8 (*) 36.0 - 46.0 %   MCV 72.7 (*) 78.0 - 100.0 fL   MCH 24.0 (*)  26.0 - 34.0 pg   MCHC 33.0  30.0 - 36.0 g/dL   RDW 14.1  11.5 - 15.5 %   Platelets 195  150 - 400 K/uL   Neutrophils Relative % 51  43 - 77 %   Lymphocytes Relative 40  12 - 46 %   Monocytes Relative 8  3 - 12 %   Eosinophils Relative 1  0 - 5 %   Basophils Relative 0  0 - 1 %   Neutro Abs 2.5  1.7 - 7.7 K/uL   Lymphs Abs 1.9  0.7 - 4.0 K/uL   Monocytes Absolute 0.4  0.1 - 1.0 K/uL   Eosinophils Absolute 0.0  0.0 - 0.7 K/uL   Basophils Absolute 0.0  0.0 - 0.1 K/uL   RBC Morphology BURR CELLS    COMPREHENSIVE METABOLIC PANEL      Result Value Ref Range   Sodium 139  137 - 147 mEq/L   Potassium 3.3 (*) 3.7 - 5.3 mEq/L   Chloride 100  96 - 112 mEq/L   CO2 27  19 - 32 mEq/L   Glucose, Bld 112 (*) 70 - 99 mg/dL   BUN 12  6 - 23 mg/dL   Creatinine, Ser 0.71  0.50 - 1.10 mg/dL   Calcium 9.4  8.4 - 10.5 mg/dL   Total Protein 6.7  6.0 - 8.3 g/dL   Albumin 3.2 (*) 3.5 - 5.2 g/dL   AST 17  0 - 37 U/L   ALT 17  0 - 35 U/L   Alkaline Phosphatase 82  39 - 117 U/L   Total Bilirubin 0.4  0.3 - 1.2 mg/dL   GFR calc non Af Amer 75 (*) >90 mL/min   GFR calc Af Amer 87 (*) >90 mL/min   Anion gap 12  5 - 15  LIPASE, BLOOD      Result Value  Ref Range   Lipase 16  11 - 59 U/L  SAMPLE TO BLOOD BANK      Result Value Ref Range   Blood Bank Specimen SAMPLE AVAILABLE FOR TESTING     Sample Expiration 07/11/2014     Ct Abdomen Pelvis W Contrast  06/14/2014   CLINICAL DATA:  Mid abdominal pain.  Constipation.  Weight loss.   IMPRESSION: Multiple nonspecific low-attenuation masses within the greater omentum in the anterior aspect of the abdomen. Additionally there is a soft tissue mass adjacent to the sigmoid colon. While these may potentially be sequelae of an infectious or inflammatory process. Malignant etiology is not excluded. This can be further evaluated with PET CT as clinically indicated.  Minimal amount of nonspecific soft tissue along the posterior aspect of the right atrium. This could represent a mildly prominent mediastinal node.  These results will be called to the ordering clinician or representative by the Radiologist Assistant, and communication documented in the PACS or zVision Dashboard.   Electronically Signed   By: Lovey Newcomer M.D.   On: 06/14/2014 16:24   Nm Pet Image Initial (pi) Skull Base To Thigh  06/21/2014   CLINICAL DATA:  Initial treatment strategy for omental tumors.   IMPRESSION: 1. There is no abnormal uptake associated with the nonspecific low-attenuation nodules within the greater omentum in the anterior aspect of the abdomen. These are favored to represent of benign abnormality. 2. Nonspecific increased uptake within the right paratracheal and right hilar lymph nodes. Likely physiologic. 3. Prior granulomatous disease.   Electronically Signed   By: Queen Slough.D.  On: 06/21/2014 14:15      Imaging Review No results found.   EKG Interpretation None      MDM   Final diagnoses:  Left upper quadrant pain  Hypokalemia    New Prescriptions   POTASSIUM CHLORIDE SA (K-DUR,KLOR-CON) 20 MEQ TABLET    Take 1 tablet (20 mEq total) by mouth 2 (two) times daily.    Plan discharge   Rolland Porter,  MD, Alanson Aly, MD 07/10/14 [phone]

## 2014-07-10 NOTE — Discharge Instructions (Signed)
Continue your medications. Take the potassium pills until gone. Keep your appointment with Dr Cristina Gong later this month to get your colonoscopy and endoscopy done. You can take TUMS, simethicone (gas X) for pain or gas. Recheck if you get uncontrolled vomiting, fever or seem worse.    Hypokalemia Hypokalemia means that the amount of potassium in the blood is lower than normal.Potassium is a chemical, called an electrolyte, that helps regulate the amount of fluid in the body. It also stimulates muscle contraction and helps nerves function properly.Most of the body's potassium is inside of cells, and only a very small amount is in the blood. Because the amount in the blood is so small, minor changes can be life-threatening. CAUSES  Antibiotics.  Diarrhea or vomiting.  Using laxatives too much, which can cause diarrhea.  Chronic kidney disease.  Water pills (diuretics).  Eating disorders (bulimia).  Low magnesium level.  Sweating a lot. SIGNS AND SYMPTOMS  Weakness.  Constipation.  Fatigue.  Muscle cramps.  Mental confusion.  Skipped heartbeats or irregular heartbeat (palpitations).  Tingling or numbness. DIAGNOSIS  Your health care provider can diagnose hypokalemia with blood tests. In addition to checking your potassium level, your health care provider may also check other lab tests. TREATMENT Hypokalemia can be treated with potassium supplements taken by mouth or adjustments in your current medicines. If your potassium level is very low, you may need to get potassium through a vein (IV) and be monitored in the hospital. A diet high in potassium is also helpful. Foods high in potassium are:  Nuts, such as peanuts and pistachios.  Seeds, such as sunflower seeds and pumpkin seeds.  Peas, lentils, and lima beans.  Whole grain and bran cereals and breads.  Fresh fruit and vegetables, such as apricots, avocado, bananas, cantaloupe, kiwi, oranges, tomatoes, asparagus, and  potatoes.  Orange and tomato juices.  Red meats.  Fruit yogurt. HOME CARE INSTRUCTIONS  Take all medicines as prescribed by your health care provider.  Maintain a healthy diet by including nutritious food, such as fruits, vegetables, nuts, whole grains, and lean meats.  If you are taking a laxative, be sure to follow the directions on the label. SEEK MEDICAL CARE IF:  Your weakness gets worse.  You feel your heart pounding or racing.  You are vomiting or having diarrhea.  You are diabetic and having trouble keeping your blood glucose in the normal range. SEEK IMMEDIATE MEDICAL CARE IF:  You have chest pain, shortness of breath, or dizziness.  You are vomiting or having diarrhea for more than 2 days.  You faint. MAKE SURE YOU:   Understand these instructions.  Will watch your condition.  Will get help right away if you are not doing well or get worse. Document Released: 10/18/2005 Document Revised: 08/08/2013 Document Reviewed: 04/20/2013 [facility] Patient Information 2015 [place], [place]. This information is not intended to replace advice given to you by your health care provider. Make sure you discuss any questions you have with your health care provider.

## 2014-07-23 ENCOUNTER — Other Ambulatory Visit: Payer: Self-pay | Admitting: Gastroenterology

## 2014-08-03 ENCOUNTER — Emergency Department (HOSPITAL_COMMUNITY)
Admission: EM | Admit: 2014-08-03 | Discharge: 2014-08-03 | Disposition: A | Discharge status: Home / Self Care | Payer: Medicare Other | Attending: Emergency Medicine | Admitting: Emergency Medicine

## 2014-08-03 ENCOUNTER — Encounter (HOSPITAL_COMMUNITY): Payer: Self-pay | Admitting: Emergency Medicine

## 2014-08-03 ENCOUNTER — Emergency Department (HOSPITAL_COMMUNITY): Payer: Medicare Other

## 2014-08-03 DIAGNOSIS — Z8673 Personal history of transient ischemic attack (TIA), and cerebral infarction without residual deficits: Secondary | ICD-10-CM | POA: Diagnosis not present

## 2014-08-03 DIAGNOSIS — Z79899 Other long term (current) drug therapy: Secondary | ICD-10-CM | POA: Insufficient documentation

## 2014-08-03 DIAGNOSIS — K5900 Constipation, unspecified: Secondary | ICD-10-CM | POA: Diagnosis present

## 2014-08-03 DIAGNOSIS — E876 Hypokalemia: Secondary | ICD-10-CM | POA: Insufficient documentation

## 2014-08-03 DIAGNOSIS — K5901 Slow transit constipation: Secondary | ICD-10-CM | POA: Insufficient documentation

## 2014-08-03 DIAGNOSIS — M179 Osteoarthritis of knee, unspecified: Secondary | ICD-10-CM | POA: Insufficient documentation

## 2014-08-03 DIAGNOSIS — K644 Residual hemorrhoidal skin tags: Secondary | ICD-10-CM | POA: Insufficient documentation

## 2014-08-03 DIAGNOSIS — I10 Essential (primary) hypertension: Secondary | ICD-10-CM | POA: Insufficient documentation

## 2014-08-03 LAB — URINALYSIS, ROUTINE W REFLEX MICROSCOPIC
Bilirubin Urine: NEGATIVE
Glucose, UA: NEGATIVE mg/dL
Hgb urine dipstick: NEGATIVE
Ketones, ur: NEGATIVE mg/dL
Nitrite: NEGATIVE
Protein, ur: NEGATIVE mg/dL
Specific Gravity, Urine: 1.013 (ref 1.005–1.030)
Urobilinogen, UA: 0.2 mg/dL (ref 0.0–1.0)
pH: 7 (ref 5.0–8.0)

## 2014-08-03 LAB — CBC WITH DIFFERENTIAL/PLATELET
Basophils Absolute: 0 10*3/uL (ref 0.0–0.1)
Basophils Relative: 0 % (ref 0–1)
Eosinophils Absolute: 0.1 10*3/uL (ref 0.0–0.7)
Eosinophils Relative: 1 % (ref 0–5)
HCT: 33.6 % — ABNORMAL LOW (ref 36.0–46.0)
Hemoglobin: 11.2 g/dL — ABNORMAL LOW (ref 12.0–15.0)
Lymphocytes Relative: 31 % (ref 12–46)
Lymphs Abs: 1.8 10*3/uL (ref 0.7–4.0)
MCH: 23.6 pg — ABNORMAL LOW (ref 26.0–34.0)
MCHC: 33.3 g/dL (ref 30.0–36.0)
MCV: 70.7 fL — ABNORMAL LOW (ref 78.0–100.0)
Monocytes Absolute: 0.4 10*3/uL (ref 0.1–1.0)
Monocytes Relative: 7 % (ref 3–12)
Neutro Abs: 3.6 10*3/uL (ref 1.7–7.7)
Neutrophils Relative %: 61 % (ref 43–77)
Platelets: 247 10*3/uL (ref 150–400)
RBC: 4.75 MIL/uL (ref 3.87–5.11)
RDW: 14 % (ref 11.5–15.5)
WBC: 5.9 10*3/uL (ref 4.0–10.5)

## 2014-08-03 LAB — COMPREHENSIVE METABOLIC PANEL
ALT: 16 U/L (ref 0–35)
AST: 15 U/L (ref 0–37)
Albumin: 3.5 g/dL (ref 3.5–5.2)
Alkaline Phosphatase: 92 U/L (ref 39–117)
Anion gap: 11 (ref 5–15)
BUN: 14 mg/dL (ref 6–23)
CO2: 28 mEq/L (ref 19–32)
Calcium: 9.4 mg/dL (ref 8.4–10.5)
Chloride: 95 mEq/L — ABNORMAL LOW (ref 96–112)
Creatinine, Ser: 0.63 mg/dL (ref 0.50–1.10)
GFR calc Af Amer: >90 mL/min (ref >90)
GFR calc non Af Amer: 78 mL/min — ABNORMAL LOW (ref >90)
Glucose, Bld: 140 mg/dL — ABNORMAL HIGH (ref 70–99)
Potassium: 3.3 mEq/L — ABNORMAL LOW (ref 3.7–5.3)
Sodium: 134 mEq/L — ABNORMAL LOW (ref 137–147)
Total Bilirubin: 0.3 mg/dL (ref 0.3–1.2)
Total Protein: 7.3 g/dL (ref 6.0–8.3)

## 2014-08-03 LAB — URINE MICROSCOPIC-ADD ON

## 2014-08-03 LAB — LIPASE, BLOOD: Lipase: 16 U/L (ref 11–59)

## 2014-08-03 MED ORDER — POTASSIUM CHLORIDE CRYS ER 20 MEQ PO TBCR
40.0000 meq | EXTENDED_RELEASE_TABLET | Freq: Once | ORAL | Status: AC
  Administered 2014-08-03: 40 meq via ORAL
  Filled 2014-08-03: qty 2

## 2014-08-03 MED ORDER — BISACODYL 5 MG PO TBEC: 5.0000 mg | DELAYED_RELEASE_TABLET | Freq: Every day | ORAL | Status: DC | PRN

## 2014-08-03 NOTE — ED Provider Notes (Signed)
CSN: [id number]     Arrival date & time 08/03/14  0225 History   First MD Initiated Contact with Patient 08/03/14 [phone]     Chief Complaint  Patient presents with  . Constipation   Patient is a 80 y.o. female presenting with constipation.  Constipation Associated symptoms: abdominal pain   Associated symptoms: no diarrhea, no dysuria, no fever, no nausea and no vomiting     Patient is an 80 y.o. Female who presents to the ED with abdominal pain and constipation.  Patient states that last night she was having generalized abdominal pain which the patient thinks is due to constipation.  She states that she normally has a bowel movement daily, but at this time she has not had a bowel movement since Thursday.  She has tried enemas at home with little relief and has also tried manually disempacting herself.  She states that she normally has issues with constipation and was on miralax for sometime, but she does not like taking it and is no longer taking it.  Patient just recently had a colonoscopy performed by Dr. Cristina Gong which she states that removed a blockage but she has had no further issues since then.  Chart review revealed a recent abdominal CT on 06/11/14 which reveals some soft tissue omental masses which were further investigated by a PET scan which was without uptake.  Masses were thought to be benign.    Past Medical History  Diagnosis Date  . Hyperlipidemia   . Hypertension   . Osteoarthritis, knee/lower leg   . CVA (cerebral infarction)     No residuals, cannot tolerate statins   . Arthritis    Past Surgical History  Procedure Laterality Date  . Total hip arthroplasty  10/10   Family History  Problem Relation Age of Onset  . Cancer Mother   . Heart disease Mother   . Cancer Sister     esophagus  . Cancer Sister     colon  . Cancer Sister     abdomin   History  Substance Use Topics  . Smoking status: Never Smoker   . Smokeless tobacco: Never Used  . Alcohol Use: No    OB History   Grav Para Term Preterm Abortions TAB SAB Ect Mult Living                 Review of Systems  Constitutional: Negative for fever, chills and fatigue.  Respiratory: Negative for chest tightness and shortness of breath.   Cardiovascular: Negative for chest pain and palpitations.  Gastrointestinal: Positive for abdominal pain and constipation. Negative for nausea, vomiting, diarrhea, blood in stool and anal bleeding.  Genitourinary: Negative for dysuria, urgency, frequency, hematuria and difficulty urinating.  All other systems reviewed and are negative.     Allergies  Ciprocin-fluocin-procin and Ciprofloxacin  Home Medications   Prior to Admission medications   Medication Sig Start Date End Date Taking? Authorizing Provider  aluminum & magnesium hydroxide-simethicone (MYLANTA) 500-450-40 MG/5ML suspension Take 30 mLs by mouth every 6 (six) hours as needed for indigestion.   Yes Historical Provider, MD  atenolol (TENORMIN) 50 MG tablet Take 50 mg by mouth daily.   Yes Historical Provider, MD  chlorthalidone (HYGROTON) 25 MG tablet Take 25 mg by mouth daily.   Yes Historical Provider, MD  diclofenac sodium (VOLTAREN) 1 % GEL Apply 2 g topically 4 (four) times daily as needed (for pain).   Yes Historical Provider, MD  pantoprazole (PROTONIX) 40 MG tablet Take 40  mg by mouth daily.   Yes Historical Provider, MD  Polyethyl Glycol-Propyl Glycol (SYSTANE OP) Place 1 drop into both eyes as needed (for dry or burning).   Yes Historical Provider, MD  Probiotic Product (ALIGN) 4 MG CAPS Take 4 mg by mouth daily.   Yes Historical Provider, MD   BP 124/63  Pulse 70  Temp(Src) 97.8 F (36.6 C) (Oral)  Resp 18  Ht 5\' 3"  (1.6 m)  Wt 140 lb (63.504 kg)  BMI 24.81 kg/m2  SpO2 100% Physical Exam  Nursing note and vitals reviewed. Constitutional: She is oriented to person, place, and time. She appears well-developed and well-nourished. No distress.  HENT:  Head: Normocephalic and  atraumatic.  Mouth/Throat: Oropharynx is clear and moist. No oropharyngeal exudate.  Eyes: Conjunctivae and EOM are normal. Pupils are equal, round, and reactive to light. No scleral icterus.  Neck: Normal range of motion. Neck supple. No JVD present. No thyromegaly present.  Cardiovascular: Normal rate, regular rhythm, normal heart sounds and intact distal pulses.  Exam reveals no gallop and no friction rub.   No murmur heard. Pulmonary/Chest: Effort normal and breath sounds normal. No respiratory distress. She has no wheezes. She has no rales. She exhibits no tenderness.  Abdominal: Soft. Bowel sounds are normal. She exhibits no distension and no mass. There is no tenderness. There is no rebound and no guarding.  Genitourinary: Rectal exam shows external hemorrhoid. Rectal exam shows no internal hemorrhoid, no fissure, no mass, no tenderness and anal tone normal. Guaiac negative stool.  Soft brown stool in the rectal vault  Musculoskeletal: Normal range of motion.  Lymphadenopathy:    She has no cervical adenopathy.  Neurological: She is alert and oriented to person, place, and time. She has normal strength. No cranial nerve deficit or sensory deficit. Coordination normal.  Skin: Skin is warm and dry. She is not diaphoretic.  Psychiatric: She has a normal mood and affect. Her behavior is normal. Judgment and thought content normal.    ED Course  Procedures (including critical care time) Labs Review Labs Reviewed  CBC WITH DIFFERENTIAL - Abnormal; Notable for the following:    Hemoglobin 11.2 (*)    HCT 33.6 (*)    MCV 70.7 (*)    MCH 23.6 (*)    All other components within normal limits  COMPREHENSIVE METABOLIC PANEL - Abnormal; Notable for the following:    Sodium 134 (*)    Potassium 3.3 (*)    Chloride 95 (*)    Glucose, Bld 140 (*)    GFR calc non Af Amer 78 (*)    All other components within normal limits  URINALYSIS, ROUTINE W REFLEX MICROSCOPIC - Abnormal; Notable for the  following:    APPearance CLOUDY (*)    Leukocytes, UA SMALL (*)    All other components within normal limits  URINE MICROSCOPIC-ADD ON - Abnormal; Notable for the following:    Bacteria, UA MANY (*)    All other components within normal limits  LIPASE, BLOOD  POC OCCULT BLOOD, ED    Imaging Review Dg Abd Acute W/chest  08/03/2014   CLINICAL DATA:  Constipation.  Last bowel movement 2 days ago.  EXAM: ACUTE ABDOMEN SERIES (ABDOMEN 2 VIEW & CHEST 1 VIEW)  COMPARISON:  CT abdomen and pelvis 06/14/2014.  Abdomen 06/07/2014  FINDINGS: Emphysematous changes and scattered fibrosis in the lungs. Normal heart size and pulmonary vascularity. No focal airspace disease or consolidation in the lungs. No blunting of costophrenic angles. No  pneumothorax. Mediastinal contours appear intact. Degenerative changes in the shoulders and spine.  Scattered gas and stool in the colon and small bowel. No small or large bowel distention. No free intra-abdominal air. No abnormal air-fluid levels. No radiopaque stones. Calcifications in the pelvis and projected over the right sacrum probably represent phleboliths. Postoperative changes with right hip arthroplasty. Degenerative changes in the lumbar spine. No significant change since prior study.  IMPRESSION: Emphysematous changes and fibrosis in the lungs. No evidence of active pulmonary disease. Nonobstructive bowel gas pattern.   Electronically Signed   By: Lucienne Capers M.D.   On: 08/03/2014 04:56     EKG Interpretation None      MDM   Final diagnoses:  Slow transit constipation  Hypokalemia   Patient is an 80 y.o. Female who presents to the ED with abdominal pain and constipation.  Physical exam reveals non-toxic appearing female with non-tender belly on exam.  Rectal exam reveals soft brown stool in the rectal vault with no evidence of impaction.  CBC appears to be at baseline anemia.  CMP reveals mild hypokalemia and possible dehydration.  UA reveals many  bacterial and small leukocytes. Will culture the urine.  Lipase unremarkable.  Hemoccult is trace positive which is likely due to digital trauma.  Abdominal xray reveals no evidence of bowel obstruction gas pattern.  Given recent CT scan and PET scan do not feel that imaging is warranted at this time and patient is pain free.  Will have the patient follow-up closely with her PCP and will send the patient home with a stool softener.  Patient has had 2 BMs here with relief of her symptoms.  Patient is stable for discharge at this time.  Patient was also seen by Dr. Reather Converse who agrees with the above plan and workup.  Patient to return for SBO symptoms, messenteric ischemia symptoms, or any other concerning symptoms.  Patient states understanding and agreement.      Cherylann Parr, PA-C 08/03/14 0820  Cherylann Parr, PA-C 08/03/14 1224  Cherylann Parr, PA-C 08/03/14 [phone]

## 2014-08-03 NOTE — ED Notes (Signed)
thge pt is c/o being constipated.  Her last bm was Thursday.  No nv

## 2014-08-03 NOTE — ED Provider Notes (Signed)
Medical screening examination/treatment/procedure(s) were conducted as a shared visit with non-physician practitioner(s) or resident and myself. I personally evaluated the patient during the encounter and agree with the findings.  I have personally reviewed any xrays and/ or EKG's with the provider and I agree with interpretation.  Patient with intermittent abdominal bloating and mild discomfort for the past few months, patient's last bowel movement was Thursday and she normally daily. Patient has had recent colonoscopy, recent CT scan and has close followup outpatient. On exam patient is no tenderness, soft abdomen no guarding normal bowel sounds. Patient had 2 bowel movements in the ER and feels improved. Blood work and vitals unremarkable. Urine culture to be sent. Discussed reasons to return to the ER I do not feel patient needs repeat CT scan at this time. Discussed when necessary stool softeners.  Abdominal bloating/cramping   Mariea Clonts, MD 08/03/14 [phone]

## 2014-08-03 NOTE — Discharge Instructions (Signed)
Constipation °Constipation is when a person has fewer than three bowel movements a week, has difficulty having a bowel movement, or has stools that are dry, hard, or larger than normal. As people grow older, constipation is more common. If you try to fix constipation with medicines that make you have a bowel movement (laxatives), the problem may get worse. Long-term laxative use may cause the muscles of the colon to become weak. A low-fiber diet, not taking in enough fluids, and taking certain medicines may make constipation worse.  °CAUSES  °· Certain medicines, such as antidepressants, pain medicine, iron supplements, antacids, and water pills.   °· Certain diseases, such as diabetes, irritable bowel syndrome (IBS), thyroid disease, or depression.   °· Not drinking enough water.   °· Not eating enough fiber-rich foods.   °· Stress or travel.   °· Lack of physical activity or exercise.   °· Ignoring the urge to have a bowel movement.   °· Using laxatives too much.   °SIGNS AND SYMPTOMS  °· Having fewer than three bowel movements a week.   °· Straining to have a bowel movement.   °· Having stools that are hard, dry, or larger than normal.   °· Feeling full or bloated.   °· Pain in the lower abdomen.   °· Not feeling relief after having a bowel movement.   °DIAGNOSIS  °Your health care provider will take a medical history and perform a physical exam. Further testing may be done for severe constipation. Some tests may include: °· A barium enema X-ray to examine your rectum, colon, and, sometimes, your small intestine.   °· A sigmoidoscopy to examine your lower colon.   °· A colonoscopy to examine your entire colon. °TREATMENT  °Treatment will depend on the severity of your constipation and what is causing it. Some dietary treatments include drinking more fluids and eating more fiber-rich foods. Lifestyle treatments may include regular exercise. If these diet and lifestyle recommendations do not help, your health care  provider may recommend taking over-the-counter laxative medicines to help you have bowel movements. Prescription medicines may be prescribed if over-the-counter medicines do not work.  °HOME CARE INSTRUCTIONS  °· Eat foods that have a lot of fiber, such as fruits, vegetables, whole grains, and beans. °· Limit foods high in fat and processed sugars, such as french fries, hamburgers, cookies, candies, and soda.   °· A fiber supplement may be added to your diet if you cannot get enough fiber from foods.   °· Drink enough fluids to keep your urine clear or pale yellow.   °· Exercise regularly or as directed by your health care provider.   °· Go to the restroom when you have the urge to go. Do not hold it.   °· Only take over-the-counter or prescription medicines as directed by your health care provider. Do not take other medicines for constipation without talking to your health care provider first.   °SEEK IMMEDIATE MEDICAL CARE IF:  °· You have bright red blood in your stool.   °· Your constipation lasts for more than 4 days or gets worse.   °· You have abdominal or rectal pain.   °· You have thin, pencil-like stools.   °· You have unexplained weight loss. °MAKE SURE YOU:  °· Understand these instructions. °· Will watch your condition. °· Will get help right away if you are not doing well or get worse. °Document Released: 07/16/2004 Document Revised: 10/23/2013 Document Reviewed: 07/30/2013 °Exit[employer]® Patient Information ©2015 [employer]. This information is not intended to replace advice given to you by your health care provider. Make sure you discuss any questions   you have with your health care provider.  Hypokalemia Hypokalemia means that the amount of potassium in the blood is lower than normal.Potassium is a chemical, called an electrolyte, that helps regulate the amount of fluid in the body. It also stimulates muscle contraction and helps nerves function properly.Most of the body's potassium is inside of  cells, and only a very small amount is in the blood. Because the amount in the blood is so small, minor changes can be life-threatening. CAUSES  Antibiotics.  Diarrhea or vomiting.  Using laxatives too much, which can cause diarrhea.  Chronic kidney disease.  Water pills (diuretics).  Eating disorders (bulimia).  Low magnesium level.  Sweating a lot. SIGNS AND SYMPTOMS  Weakness.  Constipation.  Fatigue.  Muscle cramps.  Mental confusion.  Skipped heartbeats or irregular heartbeat (palpitations).  Tingling or numbness. DIAGNOSIS  Your health care provider can diagnose hypokalemia with blood tests. In addition to checking your potassium level, your health care provider may also check other lab tests. TREATMENT Hypokalemia can be treated with potassium supplements taken by mouth or adjustments in your current medicines. If your potassium level is very low, you may need to get potassium through a vein (IV) and be monitored in the hospital. A diet high in potassium is also helpful. Foods high in potassium are:  Nuts, such as peanuts and pistachios.  Seeds, such as sunflower seeds and pumpkin seeds.  Peas, lentils, and lima beans.  Whole grain and bran cereals and breads.  Fresh fruit and vegetables, such as apricots, avocado, bananas, cantaloupe, kiwi, oranges, tomatoes, asparagus, and potatoes.  Orange and tomato juices.  Red meats.  Fruit yogurt. HOME CARE INSTRUCTIONS  Take all medicines as prescribed by your health care provider.  Maintain a healthy diet by including nutritious food, such as fruits, vegetables, nuts, whole grains, and lean meats.  If you are taking a laxative, be sure to follow the directions on the label. SEEK MEDICAL CARE IF:  Your weakness gets worse.  You feel your heart pounding or racing.  You are vomiting or having diarrhea.  You are diabetic and having trouble keeping your blood glucose in the normal range. SEEK IMMEDIATE  MEDICAL CARE IF:  You have chest pain, shortness of breath, or dizziness.  You are vomiting or having diarrhea for more than 2 days.  You faint. MAKE SURE YOU:   Understand these instructions.  Will watch your condition.  Will get help right away if you are not doing well or get worse. Document Released: 10/18/2005 Document Revised: 08/08/2013 Document Reviewed: 04/20/2013 [facility] Patient Information 2015 [place], [place]. This information is not intended to replace advice given to you by your health care provider. Make sure you discuss any questions you have with your health care provider.

## 2014-08-04 LAB — POC OCCULT BLOOD, ED: Fecal Occult Bld: POSITIVE — AB

## 2014-08-05 LAB — URINE CULTURE: Colony Count: >=100000

## 2014-08-06 ENCOUNTER — Telehealth (HOSPITAL_BASED_OUTPATIENT_CLINIC_OR_DEPARTMENT_OTHER): Payer: Self-pay | Admitting: Emergency Medicine

## 2014-08-06 NOTE — Telephone Encounter (Signed)
Post ED Visit - Positive Culture Follow-up  Culture report reviewed by antimicrobial stewardship pharmacist: []  Wes [place], Pharm.D., BCPS [x]  Heide Guile, Pharm.D., BCPS []  Alycia Rossetti, Pharm.D., BCPS []  [place], Pharm.D., BCPS, AAHIVP []  Legrand [place], Pharm.D., BCPS, AAHIVP []  Carly Sabat, Pharm.D. []  Elenor Quinones, Pharm.D.  Positive urine culture >100,000 colonies Klebsiella Treated with none, asymptomatic,no further patient follow-up is required at this time.  Hazle Nordmann 08/06/2014, 9:33 AM

## 2014-09-19 ENCOUNTER — Other Ambulatory Visit: Payer: Self-pay | Admitting: Family Medicine

## 2014-10-09 ENCOUNTER — Ambulatory Visit (INDEPENDENT_AMBULATORY_CARE_PROVIDER_SITE_OTHER): Payer: Medicare Other | Admitting: Family Medicine

## 2014-10-09 ENCOUNTER — Encounter: Payer: Self-pay | Admitting: Family Medicine

## 2014-10-09 VITALS — BP 128/72 | HR 68 | Temp 98.2°F | Ht 63.0 in | Wt 126.5 lb

## 2014-10-09 DIAGNOSIS — I10 Essential (primary) hypertension: Secondary | ICD-10-CM

## 2014-10-09 DIAGNOSIS — E7800 Pure hypercholesterolemia, unspecified: Secondary | ICD-10-CM

## 2014-10-09 DIAGNOSIS — R197 Diarrhea, unspecified: Secondary | ICD-10-CM | POA: Insufficient documentation

## 2014-10-09 LAB — CBC WITH DIFFERENTIAL/PLATELET
Basophils Absolute: 0 10*3/uL (ref 0.0–0.1)
Basophils Relative: 0 % (ref 0–1)
Eosinophils Absolute: 0.1 10*3/uL (ref 0.0–0.7)
Eosinophils Relative: 2 % (ref 0–5)
HCT: 31.7 % — ABNORMAL LOW (ref 36.0–46.0)
Hemoglobin: 10.2 g/dL — ABNORMAL LOW (ref 12.0–15.0)
Lymphocytes Relative: 37 % (ref 12–46)
Lymphs Abs: 1.9 10*3/uL (ref 0.7–4.0)
MCH: 23.2 pg — ABNORMAL LOW (ref 26.0–34.0)
MCHC: 32.2 g/dL (ref 30.0–36.0)
MCV: 72 fL — ABNORMAL LOW (ref 78.0–100.0)
MPV: 10.3 fL (ref 9.4–12.4)
Monocytes Absolute: 0.5 10*3/uL (ref 0.1–1.0)
Monocytes Relative: 9 % (ref 3–12)
Neutro Abs: 2.6 10*3/uL (ref 1.7–7.7)
Neutrophils Relative %: 52 % (ref 43–77)
Platelets: 239 10*3/uL (ref 150–400)
RBC: 4.4 MIL/uL (ref 3.87–5.11)
RDW: 15.9 % — ABNORMAL HIGH (ref 11.5–15.5)
WBC: 5 10*3/uL (ref 4.0–10.5)

## 2014-10-09 NOTE — Assessment & Plan Note (Signed)
Controlled No red flags Continue chlorthalidone and atenolol

## 2014-10-09 NOTE — Assessment & Plan Note (Signed)
Patient continues to refuse any treatment for hypercholesterolemia Will defer further lipid checks for now

## 2014-10-09 NOTE — Patient Instructions (Signed)
Great to see you Ms. Linda Macias  Lets get the stool samples we talked about  Its ok to continue to tale the immodium  If you develop fevers call right away.

## 2014-10-09 NOTE — Assessment & Plan Note (Signed)
Persistent diarrhea Well appearing, afebrile, and no abdominal pain Check stool culture, also check C. difficile with report of foul smell No recent antibiotic courses so I doubt C. difficile, I did tell her it's okay to continue Imodium unless she develops fevers in which case can discontinue use and follow-up immediately

## 2014-10-09 NOTE — Progress Notes (Signed)
Patient ID: Linda Macias, female   DOB: 02/28/1923, 80 y.o.   MRN: [id number]   HPI  Patient presents today for follow-up abdominal pain  Patient states last few days ago pain has been mostly resolved. She now complains of diarrhea She states for the last 3 weeks she's had 5-6 episodes of loose stools. She denies any blood in them. She denies fevers She states that the stools come on about one hour after each meal. She tolerates smaller meals and is now taking about 5-6 small meals per day He denies any recent antibiotic courses She's not taking any laxatives at this time.  Hypertension No chest pain, dyspnea, palpitations, leg edema, headaches Taking her meds everyday  Hyperlipidemia Continues to not want to take any medications for high cholesterol.   She also refuses flu pneumonia shots.   Smoking status noted ROS: Per HPI  Objective: BP 128/72 mmHg  Pulse 68  Temp(Src) 98.2 F (36.8 C) (Oral)  Ht 5\' 3"  (1.6 m)  Wt 126 lb 8 oz (57.38 kg)  BMI 22.41 kg/m2 Gen: NAD, alert, cooperative with exam HEENT: NCAT CV: RRR, good S1/S2, no murmur Resp: CTABL, no wheezes, non-labored Abd: SNTND, BS present, no guarding or organomegaly Ext: No edema, warm  Assessment and plan:  HYPERTENSION, BENIGN SYSTEMIC Controlled No red flags Continue chlorthalidone and atenolol   Diarrhea Persistent diarrhea Well appearing, afebrile, and no abdominal pain Check stool culture, also check C. difficile with report of foul smell No recent antibiotic courses so I doubt C. difficile, I did tell her it's okay to continue Imodium unless she develops fevers in which case can discontinue use and follow-up immediately  HYPERCHOLESTEROLEMIA Patient continues to refuse any treatment for hypercholesterolemia Will defer further lipid checks for now   Orders Placed This Encounter  Procedures  . Stool culture  . Clostridium Difficile by PCR  . CBC with Differential  . Comprehensive  metabolic panel

## 2014-10-10 ENCOUNTER — Telehealth: Payer: Self-pay | Admitting: Family Medicine

## 2014-10-10 DIAGNOSIS — E876 Hypokalemia: Secondary | ICD-10-CM

## 2014-10-10 DIAGNOSIS — R197 Diarrhea, unspecified: Secondary | ICD-10-CM

## 2014-10-10 DIAGNOSIS — D509 Iron deficiency anemia, unspecified: Secondary | ICD-10-CM

## 2014-10-10 LAB — COMPREHENSIVE METABOLIC PANEL
ALT: 11 U/L (ref 0–35)
AST: 15 U/L (ref 0–37)
Albumin: 3.3 g/dL — ABNORMAL LOW (ref 3.5–5.2)
Alkaline Phosphatase: 63 U/L (ref 39–117)
BUN: 17 mg/dL (ref 6–23)
CO2: 28 mEq/L (ref 19–32)
Calcium: 9.1 mg/dL (ref 8.4–10.5)
Chloride: 99 mEq/L (ref 96–112)
Creat: 0.8 mg/dL (ref 0.50–1.10)
Glucose, Bld: 113 mg/dL — ABNORMAL HIGH (ref 70–99)
Potassium: 2.9 mEq/L — ABNORMAL LOW (ref 3.5–5.3)
Sodium: 136 mEq/L (ref 135–145)
Total Bilirubin: 0.8 mg/dL (ref 0.2–1.2)
Total Protein: 6.2 g/dL (ref 6.0–8.3)

## 2014-10-10 MED ORDER — POTASSIUM CHLORIDE CRYS ER 20 MEQ PO TBCR: 20.0000 meq | EXTENDED_RELEASE_TABLET | Freq: Every day | ORAL | Status: DC

## 2014-10-10 MED ORDER — FERROUS SULFATE 325 (65 FE) MG PO TBEC: 325.0000 mg | DELAYED_RELEASE_TABLET | Freq: Every day | ORAL | Status: DC

## 2014-10-10 NOTE — Telephone Encounter (Signed)
Called patient to discuss labs  Anemia - Hgb down 1g since 2 months ago, microcytic. Previously not taking iron due to constipation, now has diarrhea.  Will check ferritin and iron, check CBC next week.  Rx for ferrous sulfate sent  Hypokalemia - likely due to diarrrhea Was prescribed a few KDUR in teh ED, Refill to take BID while she has diarrhea Cre 0.8 Rx sent for Kdur Re-check BMP next week  Laroy Apple, MD [place] Resident, PGY-3 10/10/2014, 12:55 PM

## 2014-10-11 NOTE — Addendum Note (Signed)
Addended by: Lianne Bushy on: 10/11/2014 04:33 PM   Modules accepted: Orders

## 2014-10-12 LAB — CLOSTRIDIUM DIFFICILE BY PCR: Toxigenic C. Difficile by PCR: NOT DETECTED

## 2014-10-15 LAB — STOOL CULTURE

## 2014-10-16 ENCOUNTER — Other Ambulatory Visit: Payer: Medicare Other

## 2014-10-16 DIAGNOSIS — E876 Hypokalemia: Secondary | ICD-10-CM

## 2014-10-16 DIAGNOSIS — D509 Iron deficiency anemia, unspecified: Secondary | ICD-10-CM

## 2014-10-16 DIAGNOSIS — R197 Diarrhea, unspecified: Secondary | ICD-10-CM

## 2014-10-16 LAB — CBC WITH DIFFERENTIAL/PLATELET
Basophils Absolute: 0 10*3/uL (ref 0.0–0.1)
Basophils Relative: 0 % (ref 0–1)
Eosinophils Absolute: 0.1 10*3/uL (ref 0.0–0.7)
Eosinophils Relative: 1 % (ref 0–5)
HCT: 31.7 % — ABNORMAL LOW (ref 36.0–46.0)
Hemoglobin: 10.6 g/dL — ABNORMAL LOW (ref 12.0–15.0)
Lymphocytes Relative: 33 % (ref 12–46)
Lymphs Abs: 2 10*3/uL (ref 0.7–4.0)
MCH: 23.3 pg — ABNORMAL LOW (ref 26.0–34.0)
MCHC: 33.4 g/dL (ref 30.0–36.0)
MCV: 69.7 fL — ABNORMAL LOW (ref 78.0–100.0)
MPV: 9.8 fL (ref 9.4–12.4)
Monocytes Absolute: 0.5 10*3/uL (ref 0.1–1.0)
Monocytes Relative: 8 % (ref 3–12)
Neutro Abs: 3.5 10*3/uL (ref 1.7–7.7)
Neutrophils Relative %: 58 % (ref 43–77)
Platelets: 279 10*3/uL (ref 150–400)
RBC: 4.55 MIL/uL (ref 3.87–5.11)
RDW: 16.4 % — ABNORMAL HIGH (ref 11.5–15.5)
WBC: 6 10*3/uL (ref 4.0–10.5)

## 2014-10-16 LAB — BASIC METABOLIC PANEL
BUN: 17 mg/dL (ref 6–23)
CO2: 22 mEq/L (ref 19–32)
Calcium: 8.8 mg/dL (ref 8.4–10.5)
Chloride: 100 mEq/L (ref 96–112)
Creat: 0.92 mg/dL (ref 0.50–1.10)
Glucose, Bld: 134 mg/dL — ABNORMAL HIGH (ref 70–99)
Potassium: 3.2 mEq/L — ABNORMAL LOW (ref 3.5–5.3)
Sodium: 136 mEq/L (ref 135–145)

## 2014-10-16 LAB — IRON: Iron: 54 ug/dL (ref 42–145)

## 2014-10-16 NOTE — Progress Notes (Signed)
BMP,CBC WITH DIFF,IRON AND FERRITIN DONE TODAY Linda Macias

## 2014-10-17 LAB — FERRITIN: Ferritin: 383 ng/mL — ABNORMAL HIGH (ref 10–291)

## 2014-10-18 ENCOUNTER — Telehealth: Payer: Self-pay | Admitting: Family Medicine

## 2014-10-18 NOTE — Telephone Encounter (Addendum)
Will ask nursing to inform that stool studies are normal and she can continue immodium as needed. Follow up as planned.   Laroy Apple, MD [place] Resident, PGY-3 10/18/2014, 9:14 AM   Further update, Her labs have all improved but K is still low so she should continue that.   Elevated ferritin and iron but still microcytic, no clear etiology for anemia. Plan to recheck in 3-4 months.   Laroy Apple, MD [place] Resident, PGY-3 10/18/2014, 9:25 AM

## 2014-10-21 NOTE — Telephone Encounter (Signed)
Left message on voicemail for patient to call back. 

## 2014-11-04 ENCOUNTER — Telehealth: Payer: Self-pay | Admitting: Family Medicine

## 2014-11-04 ENCOUNTER — Ambulatory Visit: Payer: Medicare Other

## 2014-11-04 ENCOUNTER — Ambulatory Visit (INDEPENDENT_AMBULATORY_CARE_PROVIDER_SITE_OTHER): Payer: Medicare Other | Admitting: Podiatry

## 2014-11-04 VITALS — BP 126/70 | HR 60 | Resp 16

## 2014-11-04 DIAGNOSIS — M779 Enthesopathy, unspecified: Secondary | ICD-10-CM | POA: Diagnosis not present

## 2014-11-04 DIAGNOSIS — M898X9 Other specified disorders of bone, unspecified site: Secondary | ICD-10-CM

## 2014-11-04 MED ORDER — TRIAMCINOLONE ACETONIDE 10 MG/ML IJ SUSP
10.0000 mg | Freq: Once | INTRAMUSCULAR | Status: AC
  Administered 2014-11-04: 10 mg

## 2014-11-04 NOTE — Telephone Encounter (Signed)
Daughter called because Dr. Cristina Gong at [facility] wants to do stool samples on her mother. The daughter said that we did stool samples in December and wanted to know what kind they were so that Dr. Cristina Gong could see our results or do another test. Please call to discuss. jw

## 2014-11-04 NOTE — Telephone Encounter (Signed)
Left message for Lujean Rave to call back.

## 2014-11-05 NOTE — Telephone Encounter (Signed)
Left another message for Lujean Rave to return call.

## 2014-11-06 NOTE — Progress Notes (Signed)
Subjective:     Patient ID: Linda Macias, female   DOB: 10/03/1923, 81 y.o.   MRN: [id number]  HPI patient presents with pain third toe right with inflammation and fluid at the interphalangeal joint medial side and inability to wear shoe gear comfortably   Review of Systems  All other systems reviewed and are negative.      Objective:   Physical Exam  Constitutional: She is oriented to person, place, and time.  Cardiovascular: Intact distal pulses.   Musculoskeletal: Normal range of motion.  Neurological: She is oriented to person, place, and time.  Skin: Skin is warm and dry.  Nursing note and vitals reviewed.  neurovascular status found to be intact with muscle strength adequate range of motion within normal limits. Patient's noted to have inflamed capsule medial side right third toe with deformity of the digit noted and pain secondary to pressure and proximal abnormality      Assessment:     Hammertoe deformity third right with inflammatory capsule occurring secondary to bone pressure    Plan:     Reviewed condition and did careful interphalangeal joint injection 2 mg dexamethasone Kenalog 2 mg Xylocaine and applied padding and advised on reduced activity area reappoint for [place] to recheck again in the next 4 weeks

## 2014-11-07 ENCOUNTER — Ambulatory Visit (INDEPENDENT_AMBULATORY_CARE_PROVIDER_SITE_OTHER): Payer: Medicare Other | Admitting: Family Medicine

## 2014-11-07 ENCOUNTER — Encounter: Payer: Self-pay | Admitting: Family Medicine

## 2014-11-07 ENCOUNTER — Other Ambulatory Visit: Payer: Self-pay

## 2014-11-07 DIAGNOSIS — Z1231 Encounter for screening mammogram for malignant neoplasm of breast: Secondary | ICD-10-CM

## 2014-11-07 DIAGNOSIS — R197 Diarrhea, unspecified: Secondary | ICD-10-CM | POA: Diagnosis not present

## 2014-11-07 NOTE — Patient Instructions (Signed)
Great to see you!  Please go to Dr. Osborn Coho office for samples  We will call and arrange the [facility] appointment.

## 2014-11-07 NOTE — Progress Notes (Signed)
Patient ID: Linda Macias, female   DOB: [birth year], 81 y.o.   MRN: [id number]   HPI  Patient presents today for follow-up diarrhea and weight loss  Patient states that she continues to have diarrhea but her abdominal pain has resolved. She describes that she has 2-3 loose stools per day. She feels that the food is undigested as it comes out. Since last seeing me she has tried Creon as a GI doctor described without any improvement in her symptoms.  She notes continued weight loss over the last 3 months, on review of the chart she is gone from 248 pounds in August to 240 pounds in October to 221 pounds today in January.  Continues to take her other medications and tolerate them well  Smoking status noted ROS: Per HPI  Objective: BP 135/79 mmHg  Pulse 75  Temp(Src) 97.2 F (36.2 C) (Oral)  Ht 5\' 3"  (1.6 m)  Wt 118 lb 14.4 oz (53.933 kg)  BMI 21.07 kg/m2 Gen: NAD, alert, cooperative with exam HEENT: NCAT CV: RRR, good S1/S2, no murmur Resp: CTABL, no wheezes, non-labored Ext: No edema, warm  Assessment and plan:  Diarrhea Continued chronic diarrhea Has had 22 pound weight loss in the last 3 months. Also seeing GI, has felt a relative Creon Called and discussed with her GI doctor today, Dr. 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 He also agrees with referring her to the digestive [facility] [place], which I wrote a referral for today.    Orders Placed This Encounter  Procedures  . Ambulatory referral to Gastroenterology    Referral Priority:  Routine    Referral Type:  Consultation    Referral Reason:  Specialty Services Required    Requested Specialty:  Gastroenterology    Number of Visits Requested:  1

## 2014-11-07 NOTE — Assessment & Plan Note (Signed)
Continued chronic diarrhea Has had 22 pound weight loss in the last 3 months. Also seeing GI, has felt a relative Creon Called and discussed with her GI doctor today, Dr. 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 He also agrees with referring her to the digestive [facility] [place], which I wrote a referral for today.

## 2014-11-08 DIAGNOSIS — M47817 Spondylosis without myelopathy or radiculopathy, lumbosacral region: Secondary | ICD-10-CM | POA: Diagnosis not present

## 2014-11-08 DIAGNOSIS — R197 Diarrhea, unspecified: Secondary | ICD-10-CM | POA: Diagnosis not present

## 2014-11-24 ENCOUNTER — Encounter (HOSPITAL_COMMUNITY): Payer: Self-pay

## 2014-11-24 ENCOUNTER — Observation Stay (HOSPITAL_COMMUNITY)
Admission: EM | Admit: 2014-11-24 | Discharge: 2014-11-26 | Disposition: A | Discharge status: Home / Self Care | Payer: Medicare Other | Attending: Family Medicine | Admitting: Family Medicine

## 2014-11-24 DIAGNOSIS — E669 Obesity, unspecified: Secondary | ICD-10-CM | POA: Insufficient documentation

## 2014-11-24 DIAGNOSIS — Z881 Allergy status to other antibiotic agents status: Secondary | ICD-10-CM | POA: Diagnosis not present

## 2014-11-24 DIAGNOSIS — R531 Weakness: Secondary | ICD-10-CM | POA: Diagnosis not present

## 2014-11-24 DIAGNOSIS — Z66 Do not resuscitate: Secondary | ICD-10-CM | POA: Diagnosis not present

## 2014-11-24 DIAGNOSIS — H9190 Unspecified hearing loss, unspecified ear: Secondary | ICD-10-CM | POA: Diagnosis not present

## 2014-11-24 DIAGNOSIS — E780 Pure hypercholesterolemia: Secondary | ICD-10-CM | POA: Diagnosis not present

## 2014-11-24 DIAGNOSIS — R1013 Epigastric pain: Secondary | ICD-10-CM | POA: Insufficient documentation

## 2014-11-24 DIAGNOSIS — I10 Essential (primary) hypertension: Secondary | ICD-10-CM | POA: Diagnosis present

## 2014-11-24 DIAGNOSIS — L600 Ingrowing nail: Secondary | ICD-10-CM | POA: Insufficient documentation

## 2014-11-24 DIAGNOSIS — E785 Hyperlipidemia, unspecified: Secondary | ICD-10-CM | POA: Diagnosis not present

## 2014-11-24 DIAGNOSIS — R404 Transient alteration of awareness: Secondary | ICD-10-CM | POA: Diagnosis not present

## 2014-11-24 DIAGNOSIS — M1990 Unspecified osteoarthritis, unspecified site: Secondary | ICD-10-CM | POA: Diagnosis not present

## 2014-11-24 DIAGNOSIS — D509 Iron deficiency anemia, unspecified: Secondary | ICD-10-CM | POA: Insufficient documentation

## 2014-11-24 DIAGNOSIS — E876 Hypokalemia: Secondary | ICD-10-CM | POA: Diagnosis not present

## 2014-11-24 DIAGNOSIS — Z8673 Personal history of transient ischemic attack (TIA), and cerebral infarction without residual deficits: Secondary | ICD-10-CM | POA: Insufficient documentation

## 2014-11-24 DIAGNOSIS — R55 Syncope and collapse: Secondary | ICD-10-CM | POA: Diagnosis present

## 2014-11-24 DIAGNOSIS — E860 Dehydration: Secondary | ICD-10-CM | POA: Insufficient documentation

## 2014-11-24 DIAGNOSIS — R197 Diarrhea, unspecified: Secondary | ICD-10-CM | POA: Diagnosis not present

## 2014-11-24 DIAGNOSIS — Z6821 Body mass index (BMI) 21.0-21.9, adult: Secondary | ICD-10-CM | POA: Insufficient documentation

## 2014-11-24 DIAGNOSIS — I951 Orthostatic hypotension: Secondary | ICD-10-CM | POA: Diagnosis not present

## 2014-11-24 DIAGNOSIS — I6789 Other cerebrovascular disease: Secondary | ICD-10-CM | POA: Diagnosis present

## 2014-11-24 DIAGNOSIS — E43 Unspecified severe protein-calorie malnutrition: Secondary | ICD-10-CM | POA: Insufficient documentation

## 2014-11-24 LAB — CBC WITH DIFFERENTIAL/PLATELET
Basophils Absolute: 0 10*3/uL (ref 0.0–0.1)
Basophils Relative: 0 % (ref 0–1)
Eosinophils Absolute: 0 10*3/uL (ref 0.0–0.7)
Eosinophils Relative: 1 % (ref 0–5)
HCT: 34.3 % — ABNORMAL LOW (ref 36.0–46.0)
Hemoglobin: 11.8 g/dL — ABNORMAL LOW (ref 12.0–15.0)
Lymphocytes Relative: 24 % (ref 12–46)
Lymphs Abs: 1.8 10*3/uL (ref 0.7–4.0)
MCH: 23.8 pg — ABNORMAL LOW (ref 26.0–34.0)
MCHC: 34.4 g/dL (ref 30.0–36.0)
MCV: 69.2 fL — ABNORMAL LOW (ref 78.0–100.0)
Monocytes Absolute: 0.5 10*3/uL (ref 0.1–1.0)
Monocytes Relative: 7 % (ref 3–12)
Neutro Abs: 5.1 10*3/uL (ref 1.7–7.7)
Neutrophils Relative %: 69 % (ref 43–77)
Platelets: 242 10*3/uL (ref 150–400)
RBC: 4.96 MIL/uL (ref 3.87–5.11)
RDW: 14.8 % (ref 11.5–15.5)
WBC: 7.4 10*3/uL (ref 4.0–10.5)

## 2014-11-24 LAB — COMPREHENSIVE METABOLIC PANEL
ALT: 17 U/L (ref 0–35)
AST: 26 U/L (ref 0–37)
Albumin: 2.9 g/dL — ABNORMAL LOW (ref 3.5–5.2)
Alkaline Phosphatase: 89 U/L (ref 39–117)
Anion gap: 8 (ref 5–15)
BUN: 20 mg/dL (ref 6–23)
CO2: 24 mmol/L (ref 19–32)
Calcium: 8.6 mg/dL (ref 8.4–10.5)
Chloride: 108 mmol/L (ref 96–112)
Creatinine, Ser: 0.96 mg/dL (ref 0.50–1.10)
GFR calc Af Amer: 59 mL/min — ABNORMAL LOW (ref >90)
GFR calc non Af Amer: 51 mL/min — ABNORMAL LOW (ref >90)
Glucose, Bld: 137 mg/dL — ABNORMAL HIGH (ref 70–99)
Potassium: 2.7 mmol/L — CL (ref 3.5–5.1)
Sodium: 140 mmol/L (ref 135–145)
Total Bilirubin: 0.5 mg/dL (ref 0.3–1.2)
Total Protein: 6.1 g/dL (ref 6.0–8.3)

## 2014-11-24 LAB — I-STAT TROPONIN, ED: Troponin i, poc: 0.06 ng/mL (ref 0.00–0.08)

## 2014-11-24 LAB — MAGNESIUM: Magnesium: 1.5 mg/dL (ref 1.5–2.5)

## 2014-11-24 MED ORDER — SODIUM CHLORIDE 0.9 % IV SOLN: INTRAVENOUS | Status: DC

## 2014-11-24 MED ORDER — POTASSIUM CHLORIDE 10 MEQ/100ML IV SOLN
10.0000 meq | Freq: Once | INTRAVENOUS | Status: AC
Start: 2014-11-24 — End: 2014-11-24
  Administered 2014-11-24: 10 meq via INTRAVENOUS
  Filled 2014-11-24: qty 100

## 2014-11-24 MED ORDER — SODIUM CHLORIDE 0.9 % IV BOLUS (SEPSIS)
1000.0000 mL | Freq: Once | INTRAVENOUS | Status: AC
  Administered 2014-11-24: 1000 mL via INTRAVENOUS

## 2014-11-24 MED ORDER — ALUM & MAG HYDROXIDE-SIMETH 200-200-20 MG/5ML PO SUSP: 15.0000 mL | ORAL | Status: DC | PRN

## 2014-11-24 MED ORDER — PANTOPRAZOLE SODIUM 40 MG PO TBEC
40.0000 mg | DELAYED_RELEASE_TABLET | Freq: Every day | ORAL | Status: DC
  Administered 2014-11-24 – 2014-11-25 (×2): 40 mg via ORAL
  Filled 2014-11-24 (×3): qty 1

## 2014-11-24 MED ORDER — POTASSIUM CHLORIDE IN NACL 40-0.9 MEQ/L-% IV SOLN
INTRAVENOUS | Status: DC
  Administered 2014-11-24 – 2014-11-26 (×4): 100 mL/h via INTRAVENOUS
  Filled 2014-11-24 (×6): qty 1000

## 2014-11-24 MED ORDER — POTASSIUM CHLORIDE 10 MEQ/100ML IV SOLN
10.0000 meq | INTRAVENOUS | Status: DC
  Administered 2014-11-24 (×2): 10 meq via INTRAVENOUS
  Filled 2014-11-24 (×2): qty 100

## 2014-11-24 MED ORDER — POTASSIUM CHLORIDE CRYS ER 20 MEQ PO TBCR
40.0000 meq | EXTENDED_RELEASE_TABLET | Freq: Once | ORAL | Status: AC
  Administered 2014-11-24: 40 meq via ORAL
  Filled 2014-11-24: qty 2

## 2014-11-24 MED ORDER — WHITE PETROLATUM GEL
Status: AC
  Administered 2014-11-24: 18:00:00
  Filled 2014-11-24: qty 1

## 2014-11-24 MED ORDER — ENSURE COMPLETE PO LIQD: 237.0000 mL | Freq: Two times a day (BID) | ORAL | Status: DC

## 2014-11-24 MED ORDER — FERROUS SULFATE 325 (65 FE) MG PO TABS
325.0000 mg | ORAL_TABLET | Freq: Every day | ORAL | Status: DC
  Filled 2014-11-24 (×3): qty 1

## 2014-11-24 MED ORDER — HEPARIN SODIUM (PORCINE) 5000 UNIT/ML IJ SOLN
5000.0000 [IU] | Freq: Three times a day (TID) | INTRAMUSCULAR | Status: DC
  Administered 2014-11-24 – 2014-11-26 (×5): 5000 [IU] via SUBCUTANEOUS
  Filled 2014-11-24 (×8): qty 1

## 2014-11-24 NOTE — ED Notes (Signed)
Lab called and informed me of K 2.7, MD notified

## 2014-11-24 NOTE — ED Provider Notes (Signed)
CSN: [id number]     Arrival date & time 11/24/14  1303 History   First MD Initiated Contact with Patient 11/24/14 1305     Chief Complaint  Patient presents with  . Diarrhea  . Near Syncope     (Consider location/radiation/quality/duration/timing/severity/associated sxs/prior Treatment) The history is provided by the patient.  Linda Macias is a 81 y.o. female hx of HL, HTN, CVA, chronic diarrhea for the last month here presenting with near syncope, diarrhea. She has at least 5 episodes diarrhea daily for the last month. She has been seeing Dr. Cristina Gong and has been extensively worked up and has tried multiple medicines with no relief. Today she is trying to use the bathroom and then felt lightheaded and dizzy almost passed out. Denies any chest pain or shortness of breath.    Past Medical History  Diagnosis Date  . Hyperlipidemia   . Hypertension   . Osteoarthritis, knee/lower leg   . CVA (cerebral infarction)     No residuals, cannot tolerate statins   . Arthritis    Past Surgical History  Procedure Laterality Date  . Total hip arthroplasty  10/10   Family History  Problem Relation Age of Onset  . Cancer Mother   . Heart disease Mother   . Cancer Sister     esophagus  . Cancer Sister     colon  . Cancer Sister     abdomin   History  Substance Use Topics  . Smoking status: Never Smoker   . Smokeless tobacco: Never Used  . Alcohol Use: No   OB History    No data available     Review of Systems  Cardiovascular: Positive for near-syncope.  Gastrointestinal: Positive for diarrhea.  Neurological: Positive for dizziness.  All other systems reviewed and are negative.     Allergies  Ciprocin-fluocin-procin and Ciprofloxacin  Home Medications   Prior to Admission medications   Medication Sig Start Date End Date Taking? Authorizing Provider  atenolol (TENORMIN) 50 MG tablet Take 50 mg by mouth daily.    Historical Provider, MD  bisacodyl (DULCOLAX) 5 MG EC  tablet Take 1 tablet (5 mg total) by mouth daily as needed for moderate constipation. 08/03/14   Courtney A Forcucci, PA-C  chlorthalidone (HYGROTON) 25 MG tablet TAKE 1 TABLET DAILY 09/19/14   Timmothy Euler, MD  diclofenac sodium (VOLTAREN) 1 % GEL Apply 2 g topically 4 (four) times daily as needed (for pain).    Historical Provider, MD  ferrous sulfate 325 (65 FE) MG EC tablet Take 1 tablet (325 mg total) by mouth daily with breakfast. 10/10/14   Timmothy Euler, MD  pantoprazole (PROTONIX) 40 MG tablet Take 40 mg by mouth daily.    Historical Provider, MD  Polyethyl Glycol-Propyl Glycol (SYSTANE OP) Place 1 drop into both eyes as needed (for dry or burning).    Historical Provider, MD  potassium chloride SA (K-DUR,KLOR-CON) 20 MEQ tablet Take 1 tablet (20 mEq total) by mouth daily. 10/10/14   Timmothy Euler, MD  Probiotic Product (ALIGN) 4 MG CAPS Take 4 mg by mouth daily.    Historical Provider, MD   BP 117/57 mmHg  Pulse 75  Temp(Src) 98.2 F (36.8 C) (Oral)  Resp 17  SpO2 100% Physical Exam  Constitutional: She is oriented to person, place, and time.  Chronically ill, dehydrated   HENT:  Head: Normocephalic.  MM dry   Eyes: EOM are normal. Pupils are equal, round, and reactive to light.  Neck: Normal range of motion. Neck supple.  Cardiovascular: Normal rate and regular rhythm.   Pulmonary/Chest: Effort normal and breath sounds normal. No respiratory distress. She has no wheezes. She has no rales.  Abdominal: Soft. Bowel sounds are normal. She exhibits no distension. There is no tenderness. There is no rebound and no guarding.  Musculoskeletal: Normal range of motion. She exhibits no edema or tenderness.  Neurological: She is alert and oriented to person, place, and time. No cranial nerve deficit. Coordination normal.  Skin: Skin is warm.  Psychiatric: She has a normal mood and affect. Her behavior is normal. Judgment and thought content normal.  Nursing note and vitals  reviewed.   ED Course  Procedures (including critical care time) Labs Review Labs Reviewed  CBC WITH DIFFERENTIAL/PLATELET - Abnormal; Notable for the following:    Hemoglobin 11.8 (*)    HCT 34.3 (*)    MCV 69.2 (*)    MCH 23.8 (*)    All other components within normal limits  COMPREHENSIVE METABOLIC PANEL - Abnormal; Notable for the following:    Potassium 2.7 (*)    Glucose, Bld 137 (*)    Albumin 2.9 (*)    GFR calc non Af Amer 51 (*)    GFR calc Af Amer 59 (*)    All other components within normal limits  I-STAT TROPOININ, ED    Imaging Review No results found.   EKG Interpretation   Date/Time:  Sunday November 24 2014 13:21:03 EST Ventricular Rate:  87 PR Interval:  154 QRS Duration: 88 QT Interval:  399 QTC Calculation: 480 R Axis:   65 Text Interpretation:  Sinus rhythm No significant change since last  tracing Confirmed by Kramer Hanrahan  MD, Chasey Dull ([id number]) on 11/24/2014 1:35:08 PM      MDM   Final diagnoses:  None    Linda Macias is a 81 y.o. female here with dehydration, near syncope. She is orthostatic and drop BP to 80s when standing up. Labs showed K 2.7, supplemented. Given her orthostasis, will admit for IVF and hypokalemia.     Wandra Arthurs, MD 11/24/14 [phone]

## 2014-11-24 NOTE — H&P (Signed)
[facility] Admission History and Physical Service Pager: [phone]  Patient name: Linda Macias Medical record number: [id number] Date of birth: [birth year] Age: 81 y.o. Gender: female  Primary Care Provider: Kenn File, MD Consultants: None Code Status: DNR per pt   Chief Complaint: Near Syncope   Assessment and Plan: Linda Macias is a 81 y.o. female presenting with near syncope, orthostatic hypotension. PMH is significant for CVA, HTN, HLD, chronic diarrhea.  Near Syncope: Likely 2/2 orthostatic hypotension (BP 134/72 lying to 84/52 standing) 2/2 dehydration 2/2 chronic diarrhea.  Could have component of vasovagal as well.  Highly doubt cardiogenic origin in pt but will keep on telemetry.  - Admit to FPTS for observation, attending [facility] - Holding home chlorthalidone, atenolol (will likely discontinue on d/c) - monitor on telemetry - IVF hydration: NS @ 12mL/hr - EKG NSR, nonischemic; Troponin negative - PT/OT eval for possible SNF placement  Hypokalemia: K 2.7 on admission. - s/p repletion with KDur 34mEq and 3 runs of IV K in ED pending  - Repeat BMET in AM - f/u Mag level  Diarrhea: chronic issue with previous GI w/u. Has OP f/u with GI at [facility] on 2/5. - Continue home protonix  Microcytic Anemia, mild: Hgb 11.8 on admission (baseline 10.5-11.5) - Continue home iron supplementation - Repeat CBC in AM  HTN: Orthostatic hypotension (as above) on admission - holding home antihypertensives - continue to monitor  HLD: Not on statin  FEN/GI: Heart healthy diet, NS @ 1108mL/hr w/ 40 meq KDur/L Prophylaxis: SQ heparin  Disposition: Admit for observation, FPTS, attending [place]. Dispo pending clinical improvement, likely tomorrow AM or pending possible SNF placement  History of Present Illness: Linda Macias is a 81 y.o. female presenting with hypokalemia and concern for orthostatic hypotension.  Pt states she was in her normal state  of health up until today until she had a few episodes of near syncope today, where her daughter was able to help prevent her from falling.  This has happened multiple times in the past due to chronic diarrhea causing hypotension and orthostatics.  She states she has had now diarrhea for the past month or so and this is causing her to have diarrhea every time she eats.  She currently lives with her daughter due to concerns for this near syncope from diarrhea.  She denies any new medications, fever, chills, sweats, CP, SOB.  She does follow with Dr. Wallis Mart with GI who has tried multiple changes with medications without success.    In the ED, pt had multiple evaluations performed including EKG which showed NSR, BMET showing K+ of 2.7, Itrop negative, CBC with Hgb of 11.8 (baseline 10-11).  They performed orthostatics which were positive.  She denies any alcohol use or tobacco abuse currently.    Review Of Systems: Per HPI   Patient Active Problem List   Diagnosis Date Noted  . Microcytic anemia 10/10/2014  . Hypokalemia 10/10/2014  . Diarrhea 10/09/2014  . Abdominal pain, epigastric 05/28/2014  . Hearing loss 01/07/2014  . Cervical spondylosis without myelopathy 10/18/2013  . Ingrown toenail 10/04/2013  . Constipation 09/15/2012  . Well woman exam 02/15/2012  . Carpal tunnel syndrome 06/30/2011  . Vaginal pain 01/11/2011  . ANEMIA, SECONDARY TO ACUTE BLOOD LOSS 09/04/2009  . HIP JOINT REPLACEMENT BY OTHER MEANS 06/23/2009  . DEGENERATIVE JOINT DISEASE 04/23/2008  . OSTEOARTHROSIS UNSPEC WHETHER GEN/LOCALIZED HAND 01/05/2008  . ARTHRITIS, CERVICAL SPINE 03/09/2007  . COLON POLYP 12/29/2006  .  HYPERCHOLESTEROLEMIA 12/29/2006  . OBESITY, NOS 12/29/2006  . HYPERTENSION, BENIGN SYSTEMIC 12/29/2006  . CVA 12/29/2006   Past Medical History: Past Medical History  Diagnosis Date  . Hyperlipidemia   . Hypertension   . Osteoarthritis, knee/lower leg   . CVA (cerebral infarction)     No  residuals, cannot tolerate statins   . Arthritis    Past Surgical History: Past Surgical History  Procedure Laterality Date  . Total hip arthroplasty  10/10   Social History: History  Substance Use Topics  . Smoking status: Never Smoker   . Smokeless tobacco: Never Used  . Alcohol Use: No    Family History: Family History  Problem Relation Age of Onset  . Cancer Mother   . Heart disease Mother   . Cancer Sister     esophagus  . Cancer Sister     colon  . Cancer Sister     abdomin   Allergies and Medications: Allergies  Allergen Reactions  . Ciprocin-Fluocin-Procin [Fluocinolone Acetonide] Other (See Comments)    Diarrhea for 37 days  . Ciprofloxacin Other (See Comments)    Diarrhea for 37 days   No current facility-administered medications on file prior to encounter.   Current Outpatient Prescriptions on File Prior to Encounter  Medication Sig Dispense Refill  . atenolol (TENORMIN) 50 MG tablet Take 50 mg by mouth daily.    . bisacodyl (DULCOLAX) 5 MG EC tablet Take 1 tablet (5 mg total) by mouth daily as needed for moderate constipation. 14 tablet 0  . chlorthalidone (HYGROTON) 25 MG tablet TAKE 1 TABLET DAILY 90 tablet 3  . diclofenac sodium (VOLTAREN) 1 % GEL Apply 2 g topically 4 (four) times daily as needed (for pain).    . ferrous sulfate 325 (65 FE) MG EC tablet Take 1 tablet (325 mg total) by mouth daily with breakfast. 30 tablet 3  . pantoprazole (PROTONIX) 40 MG tablet Take 40 mg by mouth daily.    Vladimir Faster Glycol-Propyl Glycol (SYSTANE OP) Place 1 drop into both eyes as needed (for dry or burning).    . potassium chloride SA (K-DUR,KLOR-CON) 20 MEQ tablet Take 1 tablet (20 mEq total) by mouth daily. 60 tablet 3  . Probiotic Product (ALIGN) 4 MG CAPS Take 4 mg by mouth daily.      Objective: BP 117/57 mmHg  Pulse 75  Temp(Src) 98.2 F (36.8 C) (Oral)  Resp 17  SpO2 100% Exam: General: NAD, comfortable in bed  HEENT: [place]/AT, MMM Neck: No JVD   Cardiovascular: RRR, +2/6 SEM RUSB Respiratory: CTAB  Abdomen: Soft/NT/ND, NABS Extremities: No edema, +2/4 pulses B/L LE  Skin: no rashes Neuro: No focal deficits   Labs and Imaging: CBC BMET   Recent Labs Lab 11/24/14 1335  WBC 7.4  HGB 11.8*  HCT 34.3*  PLT 242    Recent Labs Lab 11/24/14 1335  NA 140  K 2.7*  CL 108  CO2 24  BUN 20  CREATININE 0.96  GLUCOSE 137*  CALCIUM 8.6     EKG - NSR   Troponin neg  Lavon Paganini, MD 11/24/2014, 2:59 PM PGY-1, [place] Intern pager: [phone], text pages welcome  I have seen and evaluated the pt with Dr. Brita Romp.  We have formulated the above history and physical with the plan.    Tamela Oddi Hess  PGY 3 [facility]

## 2014-11-24 NOTE — ED Notes (Signed)
Pt asking for her "$2000 fur cape" that she brought in. No cape observed on the patient upon arrival.

## 2014-11-24 NOTE — ED Notes (Signed)
Per EMS, pt had diarrhea(GI issues) x 1 month. Pt was observed by daughter sitting on the toilet and appeared the pt was about to faint and was helped to the couch and given something to eat. No LOC. Pt has appointment at [facility] 12/06/14 with a GI specialist to try and figure out her diarrhea problem. Pt in no acute distress.

## 2014-11-25 DIAGNOSIS — E43 Unspecified severe protein-calorie malnutrition: Secondary | ICD-10-CM | POA: Insufficient documentation

## 2014-11-25 DIAGNOSIS — I951 Orthostatic hypotension: Secondary | ICD-10-CM | POA: Diagnosis not present

## 2014-11-25 DIAGNOSIS — R531 Weakness: Secondary | ICD-10-CM | POA: Diagnosis not present

## 2014-11-25 DIAGNOSIS — E876 Hypokalemia: Secondary | ICD-10-CM

## 2014-11-25 DIAGNOSIS — R197 Diarrhea, unspecified: Secondary | ICD-10-CM

## 2014-11-25 DIAGNOSIS — E860 Dehydration: Secondary | ICD-10-CM | POA: Diagnosis not present

## 2014-11-25 LAB — BASIC METABOLIC PANEL
Anion gap: 9 (ref 5–15)
BUN: 13 mg/dL (ref 6–23)
CO2: 19 mmol/L (ref 19–32)
Calcium: 7.7 mg/dL — ABNORMAL LOW (ref 8.4–10.5)
Chloride: 115 mmol/L — ABNORMAL HIGH (ref 96–112)
Creatinine, Ser: 0.83 mg/dL (ref 0.50–1.10)
GFR calc Af Amer: 70 mL/min — ABNORMAL LOW (ref >90)
GFR calc non Af Amer: 61 mL/min — ABNORMAL LOW (ref >90)
Glucose, Bld: 101 mg/dL — ABNORMAL HIGH (ref 70–99)
Potassium: 2.9 mmol/L — ABNORMAL LOW (ref 3.5–5.1)
Sodium: 143 mmol/L (ref 135–145)

## 2014-11-25 LAB — CBC
HCT: 28.9 % — ABNORMAL LOW (ref 36.0–46.0)
Hemoglobin: 9.6 g/dL — ABNORMAL LOW (ref 12.0–15.0)
MCH: 23.6 pg — ABNORMAL LOW (ref 26.0–34.0)
MCHC: 33.2 g/dL (ref 30.0–36.0)
MCV: 71.2 fL — ABNORMAL LOW (ref 78.0–100.0)
Platelets: 219 10*3/uL (ref 150–400)
RBC: 4.06 MIL/uL (ref 3.87–5.11)
RDW: 15 % (ref 11.5–15.5)
WBC: 7.2 10*3/uL (ref 4.0–10.5)

## 2014-11-25 LAB — CLOSTRIDIUM DIFFICILE BY PCR: Toxigenic C. Difficile by PCR: NEGATIVE

## 2014-11-25 LAB — TROPONIN I
Troponin I: 0.07 ng/mL — ABNORMAL HIGH (ref <0.031)
Troponin I: 0.08 ng/mL — ABNORMAL HIGH (ref <0.031)

## 2014-11-25 MED ORDER — MAGNESIUM SULFATE 2 GM/50ML IV SOLN
2.0000 g | Freq: Once | INTRAVENOUS | Status: AC
  Administered 2014-11-25: 2 g via INTRAVENOUS
  Filled 2014-11-25: qty 50

## 2014-11-25 MED ORDER — BOOST / RESOURCE BREEZE PO LIQD
1.0000 | Freq: Three times a day (TID) | ORAL | Status: DC
  Administered 2014-11-25 (×2): 1 via ORAL

## 2014-11-25 MED ORDER — DICLOFENAC SODIUM 1 % TD GEL
2.0000 g | Freq: Four times a day (QID) | TRANSDERMAL | Status: DC | PRN
  Administered 2014-11-25: 2 g via TOPICAL
  Filled 2014-11-25: qty 100

## 2014-11-25 MED ORDER — POTASSIUM CHLORIDE CRYS ER 20 MEQ PO TBCR
40.0000 meq | EXTENDED_RELEASE_TABLET | Freq: Two times a day (BID) | ORAL | Status: AC
  Administered 2014-11-25 (×2): 40 meq via ORAL
  Filled 2014-11-25 (×2): qty 2

## 2014-11-25 NOTE — Progress Notes (Signed)
INITIAL NUTRITION ASSESSMENT  DOCUMENTATION CODES Per approved criteria  -Severe malnutrition in the context of chronic illness   Pt meets criteria for severe MALNUTRITION in the context of chronic illness as evidenced by 20% wt loss x 6 months, moderate to severe fat and muscle depletion.  INTERVENTION: -D/c Ensure Complete po BID, each supplement provides 350 kcal and 13 grams of protein -Resource Breeze po TID, each supplement provides 250 kcal and 9 grams of protein  NUTRITION DIAGNOSIS: Inadequate oral intake related to altered GI function as evidenced by diet hx, 20% wt loss x 6 months.   Goal: Pt will meet >90% of estimated nutritional needs  Monitor:  PO/supplement intake, labs, weight changes, I/O's  Reason for Assessment: MST=2  81 y.o. female  Admitting Dx: Diarrhea  81 yo F with known PMH of HTN, HLD, CVA, and almost 2 months of chronic diarrhea who presents with 3-4 day history of increasing weakness and lightheadedness. Had several pre-syncopal episodes the day of admission. She has had episodes of orthostasis and hypotension secondary to her diarrhea. She had followed up with GI here in [place] and has now been referred to [facility] for further evaluation.   ASSESSMENT: Pt admitted with diarrhea. Pt confirms a general decline in health since May 2015. She reports that she has had multiple GI issues, including a "blockage" since this time. She reports that she has an appointment with [facility] GI later next month for further work-up. Prior to May, she was very active and independent. She reveals she has been mostly bedbound for the past month.  She reports poor appetite over the past 6 months. She reveals that everything she eats "runs right through" her. She reports that her meal intake has improved since hospitalization, but she continues to get diarrhea. Noted 60% of meal completion. She reveals she was able to eat a full bowl of grits at breakfast.  Pt reports  ongoing weight loss since May. Per documented wt hx, she has experienced a 20% wt loss over the past 6 months.  Pt reports she has been drinking Ensure during hospitalization, but both Ensure and Boost give her diarrhea. She is agreeable to try [employer]. Educated pt on importance of good meal and supplement intake to promote healing.  Pt awaiting PT/OT eval for discharge disposition, however, she is reluctant to go to SNF.  Labs reviewed. K: 2.9 (on supplement), Cl: 115, Calcium: 7.7, Glucose: 101.   Nutrition Focused Physical Exam:  Subcutaneous Fat:  Orbital Region: moderate depletion Upper Arm Region: moderate depletion Thoracic and Lumbar Region: severe depletion  Muscle:  Temple Region: moderate depletion Clavicle Bone Region: severe depletion Clavicle and Acromion Bone Region: severe depletion Scapular Bone Region: severe depletion Dorsal Hand: WDL Patellar Region: moderate depletion Anterior Thigh Region: severe depletion Posterior Calf Region: moderate depletion  Edema: none present  Height: Ht Readings from Last 1 Encounters:  11/24/14 5\' 3"  (1.6 m)    Weight: Wt Readings from Last 1 Encounters:  11/24/14 119 lb 4.8 oz (54.114 kg)    Ideal Body Weight: 115#  % Ideal Body Weight: 103%  Wt Readings from Last 10 Encounters:  11/24/14 119 lb 4.8 oz (54.114 kg)  11/07/14 118 lb 14.4 oz (53.933 kg)  10/09/14 126 lb 8 oz (57.38 kg)  08/03/14 140 lb (63.504 kg)  06/07/14 148 lb (67.132 kg)  05/28/14 148 lb 9.6 oz (67.405 kg)  05/07/14 151 lb (68.493 kg)  10/17/13 162 lb (73.483 kg)  10/04/13 162 lb (73.483  kg)  09/15/12 161 lb (73.029 kg)    Usual Body Weight: 160#  % Usual Body Weight: 74%  BMI:  Body mass index is 21.14 kg/(m^2). Normal weight range  Estimated Nutritional Needs: Kcal: 1600-1800 Protein: 65-75 grams Fluid: 1.6-1.8 L  Skin: WDL  Diet Order: Diet Heart  EDUCATION NEEDS: -Education needs addressed   Intake/Output Summary  (Last 24 hours) at 11/25/14 1037 Last data filed at 11/25/14 0700  Gross per 24 hour  Intake 1531.67 ml  Output      0 ml  Net 1531.67 ml    Last BM: 11/24/14  Labs:   Recent Labs Lab 11/24/14 1314 11/24/14 1335 11/25/14 0544  NA  --  140 143  K  --  2.7* 2.9*  CL  --  108 115*  CO2  --  24 19  BUN  --  20 13  CREATININE  --  0.96 0.83  CALCIUM  --  8.6 7.7*  MG 1.5  --   --   GLUCOSE  --  137* 101*    CBG (last 3)  No results for input(s): GLUCAP in the last 72 hours.  Scheduled Meds: . feeding supplement (ENSURE COMPLETE)  237 mL Oral BID BM  . ferrous sulfate  325 mg Oral Q breakfast  . heparin  5,000 Units Subcutaneous 3 times per day  . pantoprazole  40 mg Oral Daily  . potassium chloride  40 mEq Oral BID    Continuous Infusions: . 0.9 % NaCl with KCl 40 mEq / L 100 mL/hr (11/24/14 1853)    Past Medical History  Diagnosis Date  . Hyperlipidemia   . Hypertension   . Osteoarthritis, knee/lower leg   . CVA (cerebral infarction)     No residuals, cannot tolerate statins   . Arthritis     Past Surgical History  Procedure Laterality Date  . Total hip arthroplasty  10/10    Tonnya Garbett A. Jimmye Norman, RD, LDN, CDE Pager: [phone] After hours Pager: [phone]

## 2014-11-25 NOTE — Progress Notes (Signed)
Family Medicine Teaching Service Daily Progress Note Intern Pager: [phone]  Patient name: Linda Macias Medical record number: [id number] Date of birth: [birth year] Age: 81 y.o. Gender: female  Primary Care Provider: Kenn File, MD Consultants: none Code Status: DNR per pt  Pt Overview and Major Events to Date:  1/24 - admit to FPTS for near syncope  Assessment and Plan:   Linda Macias is a 81 y.o. female presenting with near syncope, orthostatic hypotension. PMH is significant for CVA, HTN, HLD, chronic diarrhea.  Near Syncope: Likely 2/2 orthostatic hypotension (BP 134/72 lying to 84/52 standing) 2/2 dehydration 2/2 chronic diarrhea. Could have component of vasovagal as well. Highly doubt cardiogenic origin in pt but will keep on telemetry.  - Holding home chlorthalidone, atenolol (will likely discontinue on d/c) - monitor on telemetry - IVF hydration: NS + KCl @ 191mL/hr - EKG NSR, nonischemic; Troponin negative - PT/OT eval for possible SNF placement  Hypokalemia: K 2.7 on admission >> 2.9. Mag 1.5. - s/p repletion with KDur 84mEq and 1 run of IV K in ED - s/p 2g IV Mag - KDur 24mEq BID - Continue to monitor  Diarrhea: chronic issue with previous GI w/u. Has OP f/u with GI at [facility] on 2/5. - Continue home protonix  Microcytic Anemia, mild: Hgb 11.8 on admission (baseline 10.5-11.5). Hgb 9.6 this AM (likely dilutional) - Continue home iron supplementation - Continue to monitor  HTN: Orthostatic hypotension (as above) on admission. Currently normotensive - holding home antihypertensives - repeat Orthostatics - continue to monitor  HLD: Not on statin  FEN/GI: Heart healthy diet, NS @ 165mL/hr w/ 40 meq KDur/L Prophylaxis: SQ heparin  Disposition: Pending PT/OT recs. Patient reports she does not want to go to SNF.  Subjective:  Feels the same. Does not want to go home. Continues to have diarrhea  Objective: Temp:  [97.8 F (36.6 C)-99 F (37.2 C)]  97.8 F (36.6 C) (01/24 2127) Pulse Rate:  [72-90] 79 (01/24 2127) Resp:  [13-21] 16 (01/24 2127) BP: (117-135)/(49-82) 126/58 mmHg (01/24 2127) SpO2:  [98 %-100 %] 99 % (01/24 2127) Weight:  [119 lb 4.8 oz (54.114 kg)] 119 lb 4.8 oz (54.114 kg) (01/24 1715) Physical Exam: General: NAD, comfortable in bed  HEENT: [place]/AT, MMM Neck: No JVD  Cardiovascular: RRR, +2/6 SEM RUSB Respiratory: CTAB  Abdomen: Soft/NT/ND, NABS Extremities: No edema, +2/4 pulses B/L LE  Skin: no rashes Neuro: No focal deficits   Laboratory:  Recent Labs Lab 11/24/14 1335 11/25/14 0544  WBC 7.4 7.2  HGB 11.8* 9.6*  HCT 34.3* 28.9*  PLT 242 219    Recent Labs Lab 11/24/14 1335 11/25/14 0544  NA 140 143  K 2.7* 2.9*  CL 108 115*  CO2 24 19  BUN 20 13  CREATININE 0.96 0.83  CALCIUM 8.6 7.7*  PROT 6.1  --   BILITOT 0.5  --   ALKPHOS 89  --   ALT 17  --   AST 26  --   GLUCOSE 137* 101*    Imaging/Diagnostic Tests: EKG - NSR   Troponin neg  Lavon Paganini, MD 11/25/2014, 8:35 AM PGY-1, [place] Intern pager: [phone], text pages welcome

## 2014-11-25 NOTE — Evaluation (Signed)
Physical Therapy Evaluation and Discharge Patient Details Name: Linda Macias MRN: [id number] DOB: [birth year] Today's Date: 11/25/2014   History of Present Illness  Linda Macias is a pleasant 81 y/o female admitted for orthostatic hypotension and dehydration due to chronic diarrhea. She has had a good GI work-up outpatient with Dr. Cristina Gong and has an appointment at [facility] in 10 days (12/06/14). She has been staying w/ her daughter for the last 1.5 weeks and her daughter states that she is planning on moving in with her mother at discharge from the hospital.    Clinical Impression  Pt presents with functional limitations due to several months of diarrhea (remains unclear cause with pt to be seen at [facility] soon) with resulting weakness. Daughter who cares for pt is a Marine scientist and we discussed that PT is not going to be effective as long as pt continues to have frequent diarrhea, dehydration, and orthostasis. She was in complete agreement. Daughter currently manages patient providing min to moderate assist as pt needs. Patient/daughter feel a rolling walker may be of benefit when pt goes to doctor appointments. Unable to educate patient due to continued orthostasis (BP sitting 139/73, standing 98/72 with pt symptomatic and needing to sit down). Daughter is familiar with use of RW. Daughter agrees that therapies will be beneficial once pt has medically stabilized (likely after she is seen at [facility]), and has begun to regain the weight she has lost. Will defer further PT at this time. Daughter will follow-up with MD to arrange PT when she feels pt can participate/benefit.     Follow Up Recommendations No PT follow up;Supervision/Assistance - 24 hour (PT once GI issues, dehydration, orthostasis controlled)    Equipment Recommendations  Rolling walker with 5" wheels (pt and daughter feel this will help with community ambulatio)    Recommendations for Other Services       Precautions /  Restrictions Precautions Precautions: Fall Precaution Comments: daughter reports no warning Restrictions Weight Bearing Restrictions: No      Mobility  Bed Mobility   Transfers Overall transfer level: Needs assistance Equipment used: 1 person hand held assist Transfers: Sit to/from Stand Sit to Stand: Min assist Stand pivot transfers: Supervision;Min guard       General transfer comment: Incr effort and assist to stand from recliner (generally weak)  Ambulation/Gait             General Gait Details: not tested due to +orthostasis (see flowsheet)  Stairs            Wheelchair Mobility    Modified Rankin (Stroke Patients Only)       Balance Overall balance assessment: Needs assistance;History of Falls Sitting-balance support: Feet supported;No upper extremity supported Sitting balance-Leahy Scale: Good     Standing balance support: Single extremity supported Standing balance-Leahy Scale: Poor Standing balance comment: + sway/unsteady upon standing requiring UE support                             Pertinent Vitals/Pain Pain Assessment: Faces Faces Pain Scale: Hurts a little bit Pain Location: abd Pain Intervention(s): Limited activity within patient's tolerance;Monitored during session;Repositioned    Home Living Family/patient expects to be discharged to:: Private residence Living Arrangements: Children Available Help at Discharge: Family;Other (Comment) (Lives w/ daughter for last 1.5 weeks and plans to return therer at d/c) Type of Home: Other(Comment) (Townhouse but can stay on main level) Home Access: Level entry  Home Layout: Two level;Able to live on main level with bedroom/bathroom Home Equipment: Bedside commode;Hospital bed;Cane - single point      Prior Function Level of Independence: Needs assistance   Gait / Transfers Assistance Needed: Walk w/ assistance from daughter (due to weakness and falls)  ADL's /  Homemaking Assistance Needed: Bathing and dressing assistance from daughter that plans on living on her. Able to go into bathroom and perform hygeine by herself. Daughter prepares meals  Comments: Pt appears confused; daughter present and assisting w/ PLOF     Hand Dominance   Dominant Hand: Right    Extremity/Trunk Assessment   Upper Extremity Assessment: Defer to OT evaluation           Lower Extremity Assessment: Generalized weakness      Cervical / Trunk Assessment: Normal  Communication   Communication: No difficulties  Cognition Arousal/Alertness: Awake/alert Behavior During Therapy: WFL for tasks assessed/performed Overall Cognitive Status: History of cognitive impairments - at baseline       Memory: Decreased short-term memory              General Comments General comments (skin integrity, edema, etc.): Daughter present. Pt reporting she needed to use the bathroom and daughter did not want PT to assist pt--she insisted on doing this (daughter is former Marine scientist for [employer])    Exercises        Assessment/Plan    PT Assessment Patent does not need any further PT services (at this time)  PT Diagnosis Difficulty walking   PT Problem List    PT Treatment Interventions     PT Goals (Current goals can be found in the Care Plan section) Acute Rehab PT Goals Patient Stated Goal: Decreased back pain/get diarrhea under control/eat again PT Goal Formulation: All assessment and education complete, DC therapy    Frequency     Barriers to discharge        Co-evaluation               End of Session Equipment Utilized During Treatment: Gait belt Activity Tolerance: Treatment limited secondary to medical complications (Comment) (orthostasis, diarrhea) Patient left: in chair;with call bell/phone within reach;with family/visitor present Nurse Communication: Mobility status;Other (comment) (continued orthostasis)    Functional Assessment Tool Used:  clinical judgement Functional Limitation: Mobility: Walking and moving around Mobility: Walking and Moving Around Current Status [phone]): At least 20 percent but less than 40 percent impaired, limited or restricted Mobility: Walking and Moving Around Goal Status [phone]): At least 20 percent but less than 40 percent impaired, limited or restricted Mobility: Walking and Moving Around Discharge Status [phone]): At least 20 percent but less than 40 percent impaired, limited or restricted    Time: 1435-1451 PT Time Calculation (min) (ACUTE ONLY): 16 min   Charges:   PT Evaluation $Initial PT Evaluation Tier I: 1 Procedure     PT G Codes:   PT G-Codes **NOT FOR INPATIENT CLASS** Functional Assessment Tool Used: clinical judgement Functional Limitation: Mobility: Walking and moving around Mobility: Walking and Moving Around Current Status ([id number]): At least 20 percent but less than 40 percent impaired, limited or restricted Mobility: Walking and Moving Around Goal Status [phone]): At least 20 percent but less than 40 percent impaired, limited or restricted Mobility: Walking and Moving Around Discharge Status [phone]): At least 20 percent but less than 40 percent impaired, limited or restricted    Skyah Hannon 11/25/2014, 3:07 PM

## 2014-11-25 NOTE — Progress Notes (Signed)
PCP visit note   Linda Macias is a pleasant 81 y/o female admitted for orthostatic hypotension and dehydration due to chronic diarrhea. She has had a good GI work-up outpatient with Dr. Cristina Gong and has an appointment at [facility] in 10 days. I appreciate and agree with the excellent care being provided by the [address].   Of note she states she cannot tolerate PO Iron so I would consider feraheme as an option.   Laroy Apple, MD [place] Resident, PGY-3 11/25/2014, 12:08 PM

## 2014-11-25 NOTE — Progress Notes (Signed)
UR completed 

## 2014-11-25 NOTE — Evaluation (Signed)
Occupational Therapy Evaluation Patient Details Name: Linda Macias MRN: [id number] DOB: 1922/12/29 Today's Date: 11/25/2014    History of Present Illness Linda Macias is a pleasant 81 y/o female admitted for orthostatic hypotension and dehydration due to chronic diarrhea. She has had a good GI work-up outpatient with Dr. Cristina Gong and has an appointment at [facility] in 10 days (12/06/14). She has been staying w/ her daughter for the last 1.5 weeks and her daughter states that she is planning on moving in with her mother at discharge from the hospital.   Clinical Impression   Pt is an 81 y/o female whom presents w/ decreased ability to perform ADL's and functional mobility. Her daughter is moving in with her (pt had been staying w/ daughter prior to this admission). Pt and daughter state that pt is near baseline level of care/assist (daughter assist with homemaking. meal prep, hand held assist for ambulation and transfers & assist with bathe/dressing). Pt was CGA/hand held assist for functional mobility and simulated toilet transfer today, Mod I bed mobiilty and sitting EOB followed by +1 HHA around bed in room, past 3:1 to chair. No further acute OT needs identified, will sign off.    Follow Up Recommendations  Home health OT;Supervision - Intermittent    Equipment Recommendations  None recommended by OT    Recommendations for Other Services       Precautions / Restrictions Precautions Precautions: Fall Restrictions Weight Bearing Restrictions: No      Mobility Bed Mobility Overal bed mobility: Modified Independent             General bed mobility comments: Increased time due to h/o chronic back pain  Transfers Overall transfer level: Needs assistance Equipment used: 1 person hand held assist Transfers: Sit to/from Stand;Stand Pivot Transfers Sit to Stand: Supervision;Min guard Stand pivot transfers: Supervision;Min guard       General transfer comment: Pt is 1  person hand held assist for functional mobility and transfers and daughter states that this is her baseline level as well.    Balance Overall balance assessment: Needs assistance Sitting-balance support: Feet supported;No upper extremity supported Sitting balance-Leahy Scale: Good     Standing balance support: Single extremity supported;During functional activity Standing balance-Leahy Scale: Good                              ADL Overall ADL's : Needs assistance/impaired     Grooming: Wash/dry hands;Wash/dry face;Set up;Sitting;With caregiver independent assisting   Upper Body Bathing: Set up;Sitting;With caregiver independent assisting   Lower Body Bathing: Sit to/from stand;Minimal assistance;With caregiver independent assisting   Upper Body Dressing : Set up;Sitting;With caregiver independent assisting   Lower Body Dressing: Minimal assistance;With caregiver independent assisting;Sit to/from stand   Toilet Transfer: BSC;With caregiver independent assisting;Supervision/safety;Min guard   Toileting- Water quality scientist and Hygiene: Supervision/safety;With caregiver independent assisting;Sit to/from stand   Tub/ Banker:  (Pt and daughter state that she sponge bathes and daughter assists PRN)   Functional mobility during ADLs: Min guard;Caregiver able to provide necessary level of assistance (+1 hand held assist. Daughter states that this is pt's baseline ) General ADL Comments: Pt is an 81 y/o female whom presents w/ decreased ability to perform ADL's and functional mobility. Her daughter is moving in with her (pt had been staying w/ daughter prior to this admission). Pt and daughter state that pt is near baseline level of care/assist (daughter assist with homemaking. meal  prep, hand held assist for ambulation and transfers & assist with bathe/dressing). Pt was CGA/hand held assist for functional mobility and simulated toilet transfer today, Mod I bed mobiilty  and sitting EOB followed by +1 HHA around bed in room, past 3:1 to chair - declined need to use 3:1. Pt/daughter were also educated in role of OT verbally.     Vision  No change from baseline                   Perception     Praxis      Pertinent Vitals/Pain Pain Assessment: 0-10 Pain Score: 9  Pain Location: Chronic Back Pain Pain Descriptors / Indicators: Aching (Arthritis) Pain Intervention(s): Limited activity within patient's tolerance;Other (comment) (Daughter applied voltaren cream prior to OT visit)     Hand Dominance Right   Extremity/Trunk Assessment Upper Extremity Assessment Upper Extremity Assessment: Generalized weakness   Lower Extremity Assessment Lower Extremity Assessment: Defer to PT evaluation       Communication Communication Communication: No difficulties   Cognition Arousal/Alertness: Awake/alert Behavior During Therapy: WFL for tasks assessed/performed Overall Cognitive Status: Within Functional Limits for tasks assessed       Memory: Decreased short-term memory             General Comments       Exercises       Shoulder Instructions      Home Living Family/patient expects to be discharged to:: Private residence Living Arrangements: Alone Available Help at Discharge: Family;Other (Comment) (Pt has been staying w/ daughter for last 1.5 weeks and daughter states that she is going to move into her mothers house @ d/c) Type of Home: Other(Comment) (Townhouse but can stay on main level) Home Access: Level entry     Home Layout: Two level;Able to live on main level with bedroom/bathroom     Bathroom Shower/Tub: Other (comment) (Sponge bathes)   Bathroom Toilet: Standard     Home Equipment: Bedside commode;Hospital bed;Cane - single point          Prior Functioning/Environment Level of Independence: Needs assistance  Gait / Transfers Assistance Needed: Walk w/ assistance from daughter. ADL's / Homemaking Assistance  Needed: Bathing and dressing assistance from daughter that plans on living on her. Able to go into bathroom and perform hygeine by herself. Daughter prepares meals Communication / Swallowing Assistance Needed: Clear Comments: Pt appears confuesed duaghter presents and assisting w/ PLOF    OT Diagnosis: Generalized weakness   OT Problem List:     OT Treatment/Interventions:      OT Goals(Current goals can be found in the care plan section) Acute Rehab OT Goals Patient Stated Goal: Decreased back pain/get diarrhea under control/eat again  OT Frequency:     Barriers to D/C:            Co-evaluation              End of Session Equipment Utilized During Treatment: Gait belt  Activity Tolerance: Patient tolerated treatment well Patient left: in chair;with call bell/phone within reach;with nursing/sitter in room;with family/visitor present   Time: 2725-3664 OT Time Calculation (min): 25 min Charges:  OT General Charges $OT Visit: 1 Procedure OT Evaluation $Initial OT Evaluation Tier I: 1 Procedure OT Treatments $Therapeutic Activity: 8-22 mins G-Codes: OT G-codes **NOT FOR INPATIENT CLASS** Functional Assessment Tool Used: Clinical Judgement Functional Limitation: Self care Self Care Current Status (Q0347): At least 1 percent but less than 20 percent impaired, limited or restricted Self Care  Goal Status (R1021): At least 1 percent but less than 20 percent impaired, limited or restricted Self Care Discharge Status [phone]): At least 1 percent but less than 20 percent impaired, limited or restricted  Linda Macias, OTR/L 11/25/2014, 12:36 PM

## 2014-11-25 NOTE — Discharge Summary (Signed)
Linda Macias Hospital Discharge Summary  Patient name: Linda Macias Medical record number: [id number] Date of birth: 02/05/1923 Age: 81 y.o. Gender: female Date of Admission: 11/24/2014  Date of Discharge: 11/26/2014 Admitting Physician: Alveda Reasons, MD  Primary Care Provider: Kenn File, MD Consultants: none  Indication for Hospitalization: near syncope, dehydration  Discharge Diagnoses/Problem List:  Near syncope Orthostatic hypotension Hypokalemia Diarrhea Microcytic anemia HTN HLD  Disposition: home  Discharge Condition: stable  Discharge Exam: see progress note from day of discharge  Brief Hospital Course:  Linda Macias is a 81 y.o. female presenting with near syncope, orthostatic hypotension. PMH is significant for CVA, HTN, HLD, chronic diarrhea.  Near Syncope, orthostatic hypotension: Patient presenting with near syncope when standing from seated position. Likely 2/2 orthostatic hypotension (BP 134/72 lying to 84/52 standing in the ED) 2/2 dehydration 2/2 chronic diarrhea. EKG NSR and nonischemic and initial troponin neg.  Doubt cardiogenic etiology. Troponins trended and peaked at 0.08 and downtrended prior to discharge.  Likely a small amount of demand ischemia in setting of dehydration. Home antihypertensives (chlorthalidone and atenolol) held on admission and discontinued at time of discharge. Given IV hydration. Orthostatic VSs persistent prior to discharge.   Hypokalemia: K 2.7 on admission, likely related to chronic diarrhea.  Treated with multiple doses of oral KDur repletion.  K 4.4 prior to discharge. Magnesium 1.5 (low normal), so repleted with 2g IV Mg.    Microcytic Anemia, mild: Hgb 11.8 on admission (baseline 10.5-11.5). Hgb 9.2 prior to discharge (likely dilutional in setting of IV hydration). Home iron supplementation continued.  All other chronic medical conditions stable throughout admission and managed with home  regimens.  Issues for Follow Up:  - f/u BMET - check potassium - f/u Hgb - Needs to keep appt with [facility] in Feb for continued GI w/u  Significant Procedures: none  Significant Labs and Imaging:   Recent Labs Lab 11/24/14 1335 11/25/14 0544 11/26/14 0049  WBC 7.4 7.2 5.7  HGB 11.8* 9.6* 9.2*  HCT 34.3* 28.9* 27.5*  PLT 242 219 209    Recent Labs Lab 11/24/14 1314  11/24/14 1335 11/25/14 0544 11/26/14 0049  NA  --   --  140 143 143  K  --   < > 2.7* 2.9* 4.4  CL  --   --  108 115* 122*  CO2  --   --  24 19 18*  GLUCOSE  --   --  137* 101* 85  BUN  --   --  20 13 8   CREATININE  --   --  0.96 0.83 0.73  CALCIUM  --   --  8.6 7.7* 7.7*  MG 1.5  --   --   --   --   ALKPHOS  --   --  89  --   --   AST  --   --  26  --   --   ALT  --   --  17  --   --   ALBUMIN  --   --  2.9*  --   --   < > = values in this interval not displayed.  EKG - NSR   Troponin 0.07, 0.08, 0.05  Results/Tests Pending at Time of Discharge: none  Discharge Medications:    Medication List    STOP taking these medications        atenolol 50 MG tablet  Commonly known as:  TENORMIN     bisacodyl  5 MG EC tablet  Commonly known as:  DULCOLAX     chlorthalidone 25 MG tablet  Commonly known as:  HYGROTON      TAKE these medications        diclofenac sodium 1 % Gel  Commonly known as:  VOLTAREN  Apply 2 g topically 4 (four) times daily as needed (for pain).     ferrous sulfate 325 (65 FE) MG EC tablet  Take 1 tablet (325 mg total) by mouth daily with breakfast.     MULTIVITAMIN PO  Take 1 tablet by mouth daily.     pantoprazole 40 MG tablet  Commonly known as:  PROTONIX  Take 40 mg by mouth daily.     potassium chloride SA 20 MEQ tablet  Commonly known as:  K-DUR,KLOR-CON  Take 1 tablet (20 mEq total) by mouth daily.     SYSTANE OP  Place 1 drop into both eyes as needed (for dry or burning).        Discharge Instructions: Please refer to Patient Instructions section of  EMR for full details.  Patient was counseled important signs and symptoms that should prompt return to medical care, changes in medications, dietary instructions, activity restrictions, and follow up appointments.   Follow-Up Appointments: Follow-up Information    Follow up with Kenn File, MD On 12/02/2014.   Specialty:  Family Medicine   Why:  For hospital follow-up (appointment made at [facility])   Contact information:   [place] [address] [phone]       Lavon Paganini, MD 11/26/2014, 12:02 PM PGY-1, [place]

## 2014-11-26 DIAGNOSIS — I10 Essential (primary) hypertension: Secondary | ICD-10-CM

## 2014-11-26 DIAGNOSIS — I951 Orthostatic hypotension: Secondary | ICD-10-CM | POA: Diagnosis not present

## 2014-11-26 DIAGNOSIS — E860 Dehydration: Secondary | ICD-10-CM | POA: Diagnosis not present

## 2014-11-26 DIAGNOSIS — R197 Diarrhea, unspecified: Secondary | ICD-10-CM | POA: Diagnosis not present

## 2014-11-26 DIAGNOSIS — E876 Hypokalemia: Secondary | ICD-10-CM | POA: Diagnosis not present

## 2014-11-26 LAB — BASIC METABOLIC PANEL
Anion gap: 3 — ABNORMAL LOW (ref 5–15)
BUN: 8 mg/dL (ref 6–23)
CO2: 18 mmol/L — ABNORMAL LOW (ref 19–32)
Calcium: 7.7 mg/dL — ABNORMAL LOW (ref 8.4–10.5)
Chloride: 122 mmol/L — ABNORMAL HIGH (ref 96–112)
Creatinine, Ser: 0.73 mg/dL (ref 0.50–1.10)
GFR calc Af Amer: 85 mL/min — ABNORMAL LOW (ref >90)
GFR calc non Af Amer: 74 mL/min — ABNORMAL LOW (ref >90)
Glucose, Bld: 85 mg/dL (ref 70–99)
Potassium: 4.4 mmol/L (ref 3.5–5.1)
Sodium: 143 mmol/L (ref 135–145)

## 2014-11-26 LAB — TROPONIN I: Troponin I: 0.05 ng/mL — ABNORMAL HIGH (ref <0.031)

## 2014-11-26 LAB — CBC
HCT: 27.5 % — ABNORMAL LOW (ref 36.0–46.0)
Hemoglobin: 9.2 g/dL — ABNORMAL LOW (ref 12.0–15.0)
MCH: 24 pg — ABNORMAL LOW (ref 26.0–34.0)
MCHC: 33.5 g/dL (ref 30.0–36.0)
MCV: 71.6 fL — ABNORMAL LOW (ref 78.0–100.0)
Platelets: 209 10*3/uL (ref 150–400)
RBC: 3.84 MIL/uL — ABNORMAL LOW (ref 3.87–5.11)
RDW: 15.6 % — ABNORMAL HIGH (ref 11.5–15.5)
WBC: 5.7 10*3/uL (ref 4.0–10.5)

## 2014-11-26 NOTE — Discharge Instructions (Signed)
You were admitted for dehydration and low potassium.  This is all related to your chronic diarrhea.  It is important to follow up with the gastroenterologist.   Dehydration, Adult Dehydration is when you lose more fluids from the body than you take in. Vital organs like the kidneys, brain, and heart cannot function without a proper amount of fluids and salt. Any loss of fluids from the body can cause dehydration.  CAUSES   Vomiting.  Diarrhea.  Excessive sweating.  Excessive urine output.  Fever. SYMPTOMS  Mild dehydration  Thirst.  Dry lips.  Slightly dry mouth. Moderate dehydration  Very dry mouth.  Sunken eyes.  Skin does not bounce back quickly when lightly pinched and released.  Dark urine and decreased urine production.  Decreased tear production.  Headache. Severe dehydration  Very dry mouth.  Extreme thirst.  Rapid, weak pulse (more than 100 beats per minute at rest).  Cold hands and feet.  Not able to sweat in spite of heat and temperature.  Rapid breathing.  Blue lips.  Confusion and lethargy.  Difficulty being awakened.  Minimal urine production.  No tears. DIAGNOSIS  Your caregiver will diagnose dehydration based on your symptoms and your exam. Blood and urine tests will help confirm the diagnosis. The diagnostic evaluation should also identify the cause of dehydration. TREATMENT  Treatment of mild or moderate dehydration can often be done at home by increasing the amount of fluids that you drink. It is best to drink small amounts of fluid more often. Drinking too much at one time can make vomiting worse. Refer to the home care instructions below. Severe dehydration needs to be treated at the hospital where you will probably be given intravenous (IV) fluids that contain water and electrolytes. HOME CARE INSTRUCTIONS   Ask your caregiver about specific rehydration instructions.  Drink enough fluids to keep your urine clear or pale  yellow.  Drink small amounts frequently if you have nausea and vomiting.  Eat as you normally do.  Avoid:  Foods or drinks high in sugar.  Carbonated drinks.  Juice.  Extremely hot or cold fluids.  Drinks with caffeine.  Fatty, greasy foods.  Alcohol.  Tobacco.  Overeating.  Gelatin desserts.  Wash your hands well to avoid spreading bacteria and viruses.  Only take over-the-counter or prescription medicines for pain, discomfort, or fever as directed by your caregiver.  Ask your caregiver if you should continue all prescribed and over-the-counter medicines.  Keep all follow-up appointments with your caregiver. SEEK MEDICAL CARE IF:  You have abdominal pain and it increases or stays in one area (localizes).  You have a rash, stiff neck, or severe headache.  You are irritable, sleepy, or difficult to awaken.  You are weak, dizzy, or extremely thirsty. SEEK IMMEDIATE MEDICAL CARE IF:   You are unable to keep fluids down or you get worse despite treatment.  You have frequent episodes of vomiting or diarrhea.  You have blood or green matter (bile) in your vomit.  You have blood in your stool or your stool looks black and tarry.  You have not urinated in 6 to 8 hours, or you have only urinated a small amount of very dark urine.  You have a fever.  You faint. MAKE SURE YOU:   Understand these instructions.  Will watch your condition.  Will get help right away if you are not doing well or get worse. Document Released: 10/18/2005 Document Revised: 01/10/2012 Document Reviewed: 06/07/2011 ExitCare Patient Information 2015 [employer]. This information is not intended to replace advice given to you by your health care provider. Make sure you discuss any questions you have with your health care provider.

## 2014-11-26 NOTE — Progress Notes (Signed)
Discharge home. Home discharge instruction given, no question verbalized. 

## 2014-11-26 NOTE — Progress Notes (Signed)
Family Medicine Teaching Service Daily Progress Note Intern Pager: [phone]  Patient name: Linda Macias Medical record number: [id number] Date of birth: [birth year] Age: 81 y.o. Gender: female  Primary Care Provider: Kenn File, MD Consultants: none Code Status: DNR per pt  Pt Overview and Major Events to Date:  1/24 - admit to FPTS for near syncope  Assessment and Plan:   Linda Macias is a 81 y.o. female presenting with near syncope, orthostatic hypotension. PMH is significant for CVA, HTN, HLD, chronic diarrhea.  Near Syncope: Likely 2/2 orthostatic hypotension (BP 134/72 lying to 84/52 standing) 2/2 dehydration 2/2 chronic diarrhea. Could have component of vasovagal as well. Highly doubt cardiogenic origin in pt but will keep on telemetry.  - Holding home chlorthalidone, atenolol (will likely discontinue on d/c) - monitor on telemetry - no events O/N - IVF hydration: NS + KCl @ 14mL/hr - EKG NSR, nonischemic - Troponins peaked at 0.08, now downtrending - likely some small amount of demand ischemia 2/2 dehydration - PT/OT eval  Hypokalemia: Resovled. K 2.7 on admission >> 4.4. Mag 1.5. - s/p repletion with KDur 60mEq x3 and 1 run of IV K - s/p 2g IV Mag - Continue to monitor  Diarrhea: chronic issue with previous GI w/u. Has OP f/u with GI at [facility] on 2/5. - Continue home protonix - C diff neg  Microcytic Anemia, mild: Hgb 11.8 on admission (baseline 10.5-11.5). Hgb 9.2 this AM (likely dilutional) - Continue home iron supplementation - Continue to monitor  HTN: Orthostatic hypotension (as above) on admission and continues to have orthostasis. Currently normotensive - holding home antihypertensives - continue to monitor  HLD: Not on statin  FEN/GI: Heart healthy diet, NS @ 147mL/hr w/ 40 meq KDur/L Prophylaxis: SQ heparin  Disposition: Home later today  Subjective:  Feeling better. Still havign diarrhea. Ready to go home as K is  normal.  Objective: Temp:  [98.2 F (36.8 C)-98.4 F (36.9 C)] 98.3 F (36.8 C) (01/26 0516) Pulse Rate:  [70-79] 70 (01/26 0516) Resp:  [16-18] 18 (01/26 0516) BP: (113-148)/(57-72) 113/57 mmHg (01/26 0516) SpO2:  [99 %-100 %] 100 % (01/26 0516) Physical Exam: General: NAD, comfortable in bed  HEENT: [place]/AT, MMM Neck: No JVD  Cardiovascular: RRR, +2/6 SEM RUSB Respiratory: CTAB  Abdomen: Soft/NT/ND, NABS Extremities: No edema, +2/4 pulses B/L LE  Neuro: No focal deficits   Laboratory:  Recent Labs Lab 11/24/14 1335 11/25/14 0544 11/26/14 0049  WBC 7.4 7.2 5.7  HGB 11.8* 9.6* 9.2*  HCT 34.3* 28.9* 27.5*  PLT 242 219 209    Recent Labs Lab 11/24/14 1335 11/25/14 0544 11/26/14 0049  NA 140 143 143  K 2.7* 2.9* 4.4  CL 108 115* 122*  CO2 24 19 18*  BUN 20 13 8   CREATININE 0.96 0.83 0.73  CALCIUM 8.6 7.7* 7.7*  PROT 6.1  --   --   BILITOT 0.5  --   --   ALKPHOS 89  --   --   ALT 17  --   --   AST 26  --   --   GLUCOSE 137* 101* 85    Imaging/Diagnostic Tests: EKG - NSR   Troponin neg  Lavon Paganini, MD 11/26/2014, 8:48 AM PGY-1, [place] Intern pager: [phone], text pages welcome

## 2014-11-29 ENCOUNTER — Telehealth: Payer: Self-pay | Admitting: Family Medicine

## 2014-11-29 NOTE — Telephone Encounter (Signed)
Daughter called to say that mom has an appt at [facility] on Feb. 5th and that they are needing labs and images sent.  She would like to talk to someone regarding this.

## 2014-11-29 NOTE — Telephone Encounter (Signed)
Faxed notes/labs to [facility], [place]

## 2014-12-02 ENCOUNTER — Inpatient Hospital Stay: Payer: Medicare Other | Admitting: Family Medicine

## 2014-12-06 DIAGNOSIS — R197 Diarrhea, unspecified: Secondary | ICD-10-CM | POA: Diagnosis not present

## 2014-12-06 DIAGNOSIS — R109 Unspecified abdominal pain: Secondary | ICD-10-CM | POA: Diagnosis not present

## 2014-12-06 DIAGNOSIS — R634 Abnormal weight loss: Secondary | ICD-10-CM | POA: Diagnosis not present

## 2014-12-09 DIAGNOSIS — M47817 Spondylosis without myelopathy or radiculopathy, lumbosacral region: Secondary | ICD-10-CM | POA: Diagnosis not present

## 2014-12-11 DIAGNOSIS — R109 Unspecified abdominal pain: Secondary | ICD-10-CM | POA: Diagnosis not present

## 2014-12-11 DIAGNOSIS — R634 Abnormal weight loss: Secondary | ICD-10-CM | POA: Diagnosis not present

## 2014-12-12 ENCOUNTER — Ambulatory Visit: Payer: Medicaid Other

## 2014-12-18 ENCOUNTER — Telehealth: Payer: Self-pay | Admitting: Family Medicine

## 2014-12-18 NOTE — Telephone Encounter (Signed)
Daughter needs FMLA papers completed  She needs to know how to get this done Please advise

## 2014-12-18 NOTE — Telephone Encounter (Signed)
Pt daughter called and said she had a missed call but no message. Didn't see any notes, and would like to know about FMLA papers. / sr

## 2014-12-18 NOTE — Telephone Encounter (Signed)
Pt daughter wanted to know the process of getting her papers filled out. She was informed. Talmadge Ganas Kennon Holter, CMA

## 2014-12-20 MED ORDER — MOMETASONE FUROATE 50 MCG/ACT NA SUSP: 2.0000 | Freq: Every day | NASAL | Status: DC

## 2014-12-20 NOTE — Telephone Encounter (Signed)
Pt daughter informed. Yamir Carignan, CMA  

## 2014-12-20 NOTE — Telephone Encounter (Signed)
I have not seen FMLA papers.   I have sent nasonex nasal spray for sinuses. Will ask nursing to inform.   Laroy Apple, MD [place] Resident, PGY-3 12/20/2014, 4:34 PM

## 2014-12-20 NOTE — Telephone Encounter (Signed)
Linda Macias daughter called to see if a rx can be sent to pharmacy regarding Linda Macias's sinus issue. Medication Clarinex isn't helping with the condition.

## 2014-12-24 DIAGNOSIS — R197 Diarrhea, unspecified: Secondary | ICD-10-CM | POA: Diagnosis not present

## 2014-12-24 DIAGNOSIS — R634 Abnormal weight loss: Secondary | ICD-10-CM | POA: Diagnosis not present

## 2015-01-01 ENCOUNTER — Telehealth: Payer: Self-pay | Admitting: Family Medicine

## 2015-01-01 DIAGNOSIS — R197 Diarrhea, unspecified: Secondary | ICD-10-CM | POA: Diagnosis not present

## 2015-01-01 NOTE — Telephone Encounter (Signed)
Called in regards to fmla paperwork  No answer, left vm.   Laroy Apple, MD [place] Resident, PGY-3 01/01/2015, 9:21 AM

## 2015-01-02 ENCOUNTER — Telehealth: Payer: Self-pay | Admitting: Family Medicine

## 2015-01-02 NOTE — Telephone Encounter (Signed)
Another call, left VM  Laroy Apple, MD [place] Resident, PGY-3 01/02/2015, 1:58 PM

## 2015-01-02 NOTE — Telephone Encounter (Signed)
Daughter is returning Dr. Alen Bleacher call. Please call on her cell [phone]. jw

## 2015-01-03 NOTE — Telephone Encounter (Signed)
Called and discussed FMLA paperwork with daughter, she will come sign and we will fax this afternoon.  Laroy Apple, MD [place] Resident, PGY-3 01/03/2015, 12:36 PM

## 2015-01-07 DIAGNOSIS — R197 Diarrhea, unspecified: Secondary | ICD-10-CM | POA: Diagnosis not present

## 2015-01-07 DIAGNOSIS — R140 Abdominal distension (gaseous): Secondary | ICD-10-CM | POA: Diagnosis not present

## 2015-01-07 DIAGNOSIS — A049 Bacterial intestinal infection, unspecified: Secondary | ICD-10-CM | POA: Diagnosis not present

## 2015-01-07 DIAGNOSIS — R252 Cramp and spasm: Secondary | ICD-10-CM | POA: Diagnosis not present

## 2015-01-07 DIAGNOSIS — R143 Flatulence: Secondary | ICD-10-CM | POA: Diagnosis not present

## 2015-01-07 DIAGNOSIS — M47817 Spondylosis without myelopathy or radiculopathy, lumbosacral region: Secondary | ICD-10-CM | POA: Diagnosis not present

## 2015-01-30 ENCOUNTER — Ambulatory Visit: Payer: Medicaid Other

## 2015-02-07 DIAGNOSIS — M47817 Spondylosis without myelopathy or radiculopathy, lumbosacral region: Secondary | ICD-10-CM | POA: Diagnosis not present

## 2015-02-21 DIAGNOSIS — R197 Diarrhea, unspecified: Secondary | ICD-10-CM | POA: Diagnosis not present

## 2015-02-21 DIAGNOSIS — K6389 Other specified diseases of intestine: Secondary | ICD-10-CM | POA: Diagnosis not present

## 2015-02-21 DIAGNOSIS — K580 Irritable bowel syndrome with diarrhea: Secondary | ICD-10-CM | POA: Diagnosis not present

## 2015-02-25 DIAGNOSIS — K904 Malabsorption due to intolerance, not elsewhere classified: Secondary | ICD-10-CM | POA: Diagnosis not present

## 2015-02-25 DIAGNOSIS — K6389 Other specified diseases of intestine: Secondary | ICD-10-CM | POA: Diagnosis not present

## 2015-02-25 DIAGNOSIS — E46 Unspecified protein-calorie malnutrition: Secondary | ICD-10-CM | POA: Diagnosis not present

## 2015-02-25 DIAGNOSIS — K9089 Other intestinal malabsorption: Secondary | ICD-10-CM | POA: Diagnosis not present

## 2015-02-25 DIAGNOSIS — R634 Abnormal weight loss: Secondary | ICD-10-CM | POA: Diagnosis not present

## 2015-03-04 DIAGNOSIS — H903 Sensorineural hearing loss, bilateral: Secondary | ICD-10-CM | POA: Diagnosis not present

## 2015-03-09 DIAGNOSIS — M47817 Spondylosis without myelopathy or radiculopathy, lumbosacral region: Secondary | ICD-10-CM | POA: Diagnosis not present

## 2015-03-30 ENCOUNTER — Encounter (HOSPITAL_COMMUNITY): Payer: Self-pay | Admitting: Family Medicine

## 2015-03-30 ENCOUNTER — Inpatient Hospital Stay (HOSPITAL_COMMUNITY)
Admission: EM | Admit: 2015-03-30 | Discharge: 2015-04-03 | DRG: 641 | Disposition: A | Discharge status: Home Health | Payer: Medicare Other | Attending: Family Medicine | Admitting: Family Medicine

## 2015-03-30 DIAGNOSIS — R197 Diarrhea, unspecified: Secondary | ICD-10-CM | POA: Diagnosis not present

## 2015-03-30 DIAGNOSIS — E43 Unspecified severe protein-calorie malnutrition: Secondary | ICD-10-CM | POA: Diagnosis not present

## 2015-03-30 DIAGNOSIS — Z6820 Body mass index (BMI) 20.0-20.9, adult: Secondary | ICD-10-CM | POA: Diagnosis not present

## 2015-03-30 DIAGNOSIS — K6389 Other specified diseases of intestine: Secondary | ICD-10-CM | POA: Diagnosis present

## 2015-03-30 DIAGNOSIS — M47817 Spondylosis without myelopathy or radiculopathy, lumbosacral region: Secondary | ICD-10-CM | POA: Diagnosis not present

## 2015-03-30 DIAGNOSIS — R269 Unspecified abnormalities of gait and mobility: Secondary | ICD-10-CM | POA: Diagnosis not present

## 2015-03-30 DIAGNOSIS — Z79899 Other long term (current) drug therapy: Secondary | ICD-10-CM

## 2015-03-30 DIAGNOSIS — E876 Hypokalemia: Secondary | ICD-10-CM | POA: Diagnosis present

## 2015-03-30 DIAGNOSIS — Z8673 Personal history of transient ischemic attack (TIA), and cerebral infarction without residual deficits: Secondary | ICD-10-CM

## 2015-03-30 DIAGNOSIS — E860 Dehydration: Secondary | ICD-10-CM | POA: Diagnosis not present

## 2015-03-30 DIAGNOSIS — H9190 Unspecified hearing loss, unspecified ear: Secondary | ICD-10-CM | POA: Diagnosis present

## 2015-03-30 DIAGNOSIS — M4716 Other spondylosis with myelopathy, lumbar region: Secondary | ICD-10-CM | POA: Diagnosis not present

## 2015-03-30 DIAGNOSIS — Z96649 Presence of unspecified artificial hip joint: Secondary | ICD-10-CM | POA: Diagnosis present

## 2015-03-30 DIAGNOSIS — Z66 Do not resuscitate: Secondary | ICD-10-CM | POA: Diagnosis not present

## 2015-03-30 DIAGNOSIS — K909 Intestinal malabsorption, unspecified: Secondary | ICD-10-CM | POA: Diagnosis not present

## 2015-03-30 DIAGNOSIS — D509 Iron deficiency anemia, unspecified: Secondary | ICD-10-CM | POA: Diagnosis not present

## 2015-03-30 DIAGNOSIS — E785 Hyperlipidemia, unspecified: Secondary | ICD-10-CM | POA: Diagnosis present

## 2015-03-30 DIAGNOSIS — E46 Unspecified protein-calorie malnutrition: Secondary | ICD-10-CM | POA: Diagnosis present

## 2015-03-30 DIAGNOSIS — K529 Noninfective gastroenteritis and colitis, unspecified: Secondary | ICD-10-CM | POA: Insufficient documentation

## 2015-03-30 DIAGNOSIS — R5383 Other fatigue: Secondary | ICD-10-CM | POA: Diagnosis not present

## 2015-03-30 DIAGNOSIS — I10 Essential (primary) hypertension: Secondary | ICD-10-CM | POA: Diagnosis not present

## 2015-03-30 DIAGNOSIS — M179 Osteoarthritis of knee, unspecified: Secondary | ICD-10-CM | POA: Diagnosis present

## 2015-03-30 DIAGNOSIS — Z9114 Patient's other noncompliance with medication regimen: Secondary | ICD-10-CM | POA: Diagnosis present

## 2015-03-30 LAB — CBC WITH DIFFERENTIAL/PLATELET
Basophils Absolute: 0 10*3/uL (ref 0.0–0.1)
Basophils Relative: 0 % (ref 0–1)
Eosinophils Absolute: 0 10*3/uL (ref 0.0–0.7)
Eosinophils Relative: 1 % (ref 0–5)
HCT: 26.1 % — ABNORMAL LOW (ref 36.0–46.0)
Hemoglobin: 8.6 g/dL — ABNORMAL LOW (ref 12.0–15.0)
Lymphocytes Relative: 23 % (ref 12–46)
Lymphs Abs: 1.4 10*3/uL (ref 0.7–4.0)
MCH: 23.3 pg — ABNORMAL LOW (ref 26.0–34.0)
MCHC: 33 g/dL (ref 30.0–36.0)
MCV: 70.7 fL — ABNORMAL LOW (ref 78.0–100.0)
Monocytes Absolute: 0.5 10*3/uL (ref 0.1–1.0)
Monocytes Relative: 9 % (ref 3–12)
Neutro Abs: 4.1 10*3/uL (ref 1.7–7.7)
Neutrophils Relative %: 68 % (ref 43–77)
Platelets: 270 10*3/uL (ref 150–400)
RBC: 3.69 MIL/uL — ABNORMAL LOW (ref 3.87–5.11)
RDW: 14.6 % (ref 11.5–15.5)
WBC: 6 10*3/uL (ref 4.0–10.5)

## 2015-03-30 LAB — BASIC METABOLIC PANEL
Anion gap: 9 (ref 5–15)
BUN: 22 mg/dL — ABNORMAL HIGH (ref 6–20)
CO2: 26 mmol/L (ref 22–32)
Calcium: 8.9 mg/dL (ref 8.9–10.3)
Chloride: 101 mmol/L (ref 101–111)
Creatinine, Ser: 0.85 mg/dL (ref 0.44–1.00)
GFR calc Af Amer: >60 mL/min (ref >60)
GFR calc non Af Amer: 59 mL/min — ABNORMAL LOW (ref >60)
Glucose, Bld: 156 mg/dL — ABNORMAL HIGH (ref 65–99)
Potassium: 2.4 mmol/L — CL (ref 3.5–5.1)
Sodium: 136 mmol/L (ref 135–145)

## 2015-03-30 LAB — COMPREHENSIVE METABOLIC PANEL
ALT: 8 U/L — ABNORMAL LOW (ref 14–54)
AST: 12 U/L — ABNORMAL LOW (ref 15–41)
Albumin: 2.5 g/dL — ABNORMAL LOW (ref 3.5–5.0)
Alkaline Phosphatase: 56 U/L (ref 38–126)
Anion gap: 9 (ref 5–15)
BUN: 18 mg/dL (ref 6–20)
CO2: 23 mmol/L (ref 22–32)
Calcium: 8.4 mg/dL — ABNORMAL LOW (ref 8.9–10.3)
Chloride: 106 mmol/L (ref 101–111)
Creatinine, Ser: 0.69 mg/dL (ref 0.44–1.00)
GFR calc Af Amer: >60 mL/min (ref >60)
GFR calc non Af Amer: >60 mL/min (ref >60)
Glucose, Bld: 88 mg/dL (ref 65–99)
Potassium: 2.8 mmol/L — ABNORMAL LOW (ref 3.5–5.1)
Sodium: 138 mmol/L (ref 135–145)
Total Bilirubin: 0.4 mg/dL (ref 0.3–1.2)
Total Protein: 5.5 g/dL — ABNORMAL LOW (ref 6.5–8.1)

## 2015-03-30 LAB — CBC
HCT: 25.8 % — ABNORMAL LOW (ref 36.0–46.0)
Hemoglobin: 8.6 g/dL — ABNORMAL LOW (ref 12.0–15.0)
MCH: 23.4 pg — ABNORMAL LOW (ref 26.0–34.0)
MCHC: 33.3 g/dL (ref 30.0–36.0)
MCV: 70.1 fL — ABNORMAL LOW (ref 78.0–100.0)
Platelets: 262 10*3/uL (ref 150–400)
RBC: 3.68 MIL/uL — ABNORMAL LOW (ref 3.87–5.11)
RDW: 14.5 % (ref 11.5–15.5)
WBC: 6.3 10*3/uL (ref 4.0–10.5)

## 2015-03-30 LAB — LIPID PANEL
Cholesterol: 111 mg/dL (ref 0–200)
HDL: 57 mg/dL (ref >40)
LDL Cholesterol: 37 mg/dL (ref 0–99)
Total CHOL/HDL Ratio: 1.9 RATIO
Triglycerides: 84 mg/dL (ref <150)
VLDL: 17 mg/dL (ref 0–40)

## 2015-03-30 LAB — MAGNESIUM: Magnesium: 1.4 mg/dL — ABNORMAL LOW (ref 1.7–2.4)

## 2015-03-30 LAB — PHOSPHORUS: Phosphorus: 2 mg/dL — ABNORMAL LOW (ref 2.5–4.6)

## 2015-03-30 LAB — TSH: TSH: 1.591 u[IU]/mL (ref 0.350–4.500)

## 2015-03-30 LAB — PROTIME-INR
INR: 1.34 (ref 0.00–1.49)
Prothrombin Time: 16.7 seconds — ABNORMAL HIGH (ref 11.6–15.2)

## 2015-03-30 MED ORDER — MULTIVITAMIN PO LIQD: Freq: Every day | ORAL | Status: DC

## 2015-03-30 MED ORDER — ENOXAPARIN SODIUM 40 MG/0.4ML ~~LOC~~ SOLN
40.0000 mg | SUBCUTANEOUS | Status: DC
  Administered 2015-03-30 – 2015-04-02 (×4): 40 mg via SUBCUTANEOUS
  Filled 2015-03-30 (×5): qty 0.4

## 2015-03-30 MED ORDER — PANTOPRAZOLE SODIUM 40 MG PO TBEC: 40.0000 mg | DELAYED_RELEASE_TABLET | Freq: Every day | ORAL | Status: DC

## 2015-03-30 MED ORDER — ACETAMINOPHEN 325 MG PO TABS: 650.0000 mg | ORAL_TABLET | Freq: Four times a day (QID) | ORAL | Status: DC | PRN

## 2015-03-30 MED ORDER — MAGNESIUM SULFATE 2 GM/50ML IV SOLN
2.0000 g | Freq: Once | INTRAVENOUS | Status: AC
  Administered 2015-03-31: 2 g via INTRAVENOUS
  Filled 2015-03-30: qty 50

## 2015-03-30 MED ORDER — ADULT MULTIVITAMIN LIQUID CH
5.0000 mL | Freq: Every day | ORAL | Status: DC
  Administered 2015-03-31: 5 mL via ORAL
  Filled 2015-03-30 (×3): qty 5

## 2015-03-30 MED ORDER — SODIUM CHLORIDE 0.9 % IV SOLN
INTRAVENOUS | Status: DC
  Administered 2015-03-30 – 2015-03-31 (×2): via INTRAVENOUS

## 2015-03-30 MED ORDER — SODIUM CHLORIDE 0.9 % IV BOLUS (SEPSIS)
1000.0000 mL | Freq: Once | INTRAVENOUS | Status: AC
  Administered 2015-03-30: 1000 mL via INTRAVENOUS

## 2015-03-30 MED ORDER — POTASSIUM CHLORIDE 10 MEQ/100ML IV SOLN
10.0000 meq | INTRAVENOUS | Status: AC
  Administered 2015-03-30 (×3): 10 meq via INTRAVENOUS
  Filled 2015-03-30 (×3): qty 100

## 2015-03-30 MED ORDER — FLUTICASONE PROPIONATE 50 MCG/ACT NA SUSP
1.0000 | Freq: Every day | NASAL | Status: DC
Start: 2015-03-30 — End: 2015-04-03
  Administered 2015-03-31 – 2015-04-03 (×4): 1 via NASAL
  Filled 2015-03-30: qty 16

## 2015-03-30 MED ORDER — ACETAMINOPHEN 650 MG RE SUPP: 650.0000 mg | Freq: Four times a day (QID) | RECTAL | Status: DC | PRN

## 2015-03-30 MED ORDER — PROMETHAZINE HCL 25 MG PO TABS: 12.5000 mg | ORAL_TABLET | Freq: Four times a day (QID) | ORAL | Status: DC | PRN

## 2015-03-30 MED ORDER — SODIUM CHLORIDE 0.9 % IJ SOLN
3.0000 mL | Freq: Two times a day (BID) | INTRAMUSCULAR | Status: DC
  Administered 2015-03-31 – 2015-04-03 (×6): 3 mL via INTRAVENOUS

## 2015-03-30 MED ORDER — ENSURE PUDDING PO PUDG
1.0000 | Freq: Three times a day (TID) | ORAL | Status: DC
  Filled 2015-03-30 (×6): qty 1

## 2015-03-30 NOTE — Progress Notes (Signed)
All 1830 medications not approved by pharmacy medications moved to 2000, admission needs to be completed, RN Eve aware.

## 2015-03-30 NOTE — Progress Notes (Signed)
NURSING PROGRESS NOTE  Linda Macias [id number] Admission Data: 03/30/2015 6:48 PM Attending Provider: Alveda Reasons, MD [id number], Mikeal Hawthorne, MD Code Status: Full  Allergies:  Ciprofloxacin Past Medical History:   has a past medical history of Hyperlipidemia; Hypertension; Osteoarthritis, knee/lower leg; CVA (cerebral infarction); and Arthritis. Past Surgical History:   has past surgical history that includes Total hip arthroplasty (10/10). Social History:   reports that she has never smoked. She has never used smokeless tobacco. She reports that she does not drink alcohol or use illicit drugs.  Linda Macias is a 81 y.o. female patient admitted from ED:   Last Documented Vital Signs: Blood pressure 118/66, pulse 63, temperature 98.4 F (36.9 C), temperature source Oral, resp. rate 18, SpO2 97 %.  Cardiac Monitoring: Box # 10 in place. Cardiac monitor yields:normal sinus rhythm.  IV Fluids:  IV in place, occlusive dsg intact without redness, IV cath antecubital right, condition patent and no redness normal saline.   Skin: WDL  Patient/Family orientated to room. Information packet given to patient/family. Admission inpatient armband information verified with patient/family to include name and date of birth and placed on patient arm. Side rails up x 2, fall assessment and education completed with patient/family. Patient/family able to verbalize understanding of risk associated with falls and verbalized understanding to call for assistance before getting out of bed. Call light within reach. Patient/family able to voice and demonstrate understanding of unit orientation instructions.    Will continue to evaluate and treat per MD orders.   Hendricks Limes RN, BS, BSN

## 2015-03-30 NOTE — ED Provider Notes (Signed)
CSN: [id number]     Arrival date & time 03/30/15  1126 History   First MD Initiated Contact with Patient 03/30/15 1143     Chief Complaint  Patient presents with  . Weakness     (Consider location/radiation/quality/duration/timing/severity/associated sxs/prior Treatment) HPI  81 year old female presents with feeling weak and tired. She states this is because she has been having severe diarrhea. Over last 4 days she's been having 6-8 stools per day. These are watery. For about one year she's been having loose stools that she clarifies his diarrhea. She was on Xifaxan as prescribed by GI at [facility] for bacterial overgrowth but her insurance will not pay for it anymore. She was put on flagyl 5 days ago and has felt weak and had increased diarrhea (the Xifaxan is the only thing that has slowed down her diarrhea) since. No blood in stools. No fevers, vomiting or abdominal pain. No focal weakness, just a feeling of lack of energy. States everything she drinks "goes right through me".   Past Medical History  Diagnosis Date  . Hyperlipidemia   . Hypertension   . Osteoarthritis, knee/lower leg   . CVA (cerebral infarction)     No residuals, cannot tolerate statins   . Arthritis    Past Surgical History  Procedure Laterality Date  . Total hip arthroplasty  10/10   Family History  Problem Relation Age of Onset  . Cancer Mother   . Heart disease Mother   . Cancer Sister     esophagus  . Cancer Sister     colon  . Cancer Sister     abdomin   History  Substance Use Topics  . Smoking status: Never Smoker   . Smokeless tobacco: Never Used  . Alcohol Use: No   OB History    No data available     Review of Systems  Constitutional: Positive for fatigue. Negative for fever.  Gastrointestinal: Positive for diarrhea. Negative for nausea, vomiting, abdominal pain and blood in stool.  Neurological: Positive for weakness.  All other systems reviewed and are negative.     Allergies   Ciprocin-fluocin-procin and Ciprofloxacin  Home Medications   Prior to Admission medications   Medication Sig Start Date End Date Taking? Authorizing Provider  diclofenac sodium (VOLTAREN) 1 % GEL Apply 2 g topically 4 (four) times daily as needed (for pain).    Historical Provider, MD  ferrous sulfate 325 (65 FE) MG EC tablet Take 1 tablet (325 mg total) by mouth daily with breakfast. Patient not taking: Reported on 11/24/2014 10/10/14   Timmothy Euler, MD  mometasone (NASONEX) 50 MCG/ACT nasal spray Place 2 sprays into the nose daily. 12/20/14   Timmothy Euler, MD  Multiple Vitamins-Minerals (MULTIVITAMIN PO) Take 1 tablet by mouth daily.    Historical Provider, MD  pantoprazole (PROTONIX) 40 MG tablet Take 40 mg by mouth daily.    Historical Provider, MD  Polyethyl Glycol-Propyl Glycol (SYSTANE OP) Place 1 drop into both eyes as needed (for dry or burning).    Historical Provider, MD  potassium chloride SA (K-DUR,KLOR-CON) 20 MEQ tablet Take 1 tablet (20 mEq total) by mouth daily. Patient taking differently: Take 40 mEq by mouth daily.  10/10/14   Timmothy Euler, MD   BP 121/70 mmHg  Pulse 56  Temp(Src) 98.1 F (36.7 C) (Oral)  Resp 12  SpO2 98% Physical Exam  Constitutional: She is oriented to person, place, and time. She appears well-developed and well-nourished.  HENT:  Head:  Normocephalic and atraumatic.  Right Ear: External ear normal.  Left Ear: External ear normal.  Nose: Nose normal.  Mouth/Throat: Mucous membranes are dry.  Eyes: Right eye exhibits no discharge. Left eye exhibits no discharge.  Cardiovascular: Normal rate, regular rhythm and normal heart sounds.   Pulmonary/Chest: Effort normal and breath sounds normal.  Abdominal: Soft. She exhibits no distension. There is no tenderness.  Neurological: She is alert and oriented to person, place, and time.  Skin: Skin is warm and dry.  Nursing note and vitals reviewed.   ED Course  Procedures (including  critical care time) Labs Review Labs Reviewed  CBC WITH DIFFERENTIAL/PLATELET - Abnormal; Notable for the following:    RBC 3.69 (*)    Hemoglobin 8.6 (*)    HCT 26.1 (*)    MCV 70.7 (*)    MCH 23.3 (*)    All other components within normal limits  BASIC METABOLIC PANEL - Abnormal; Notable for the following:    Potassium 2.4 (*)    Glucose, Bld 156 (*)    BUN 22 (*)    GFR calc non Af Amer 59 (*)    All other components within normal limits    Imaging Review No results found.   EKG Interpretation   Date/Time:  Sunday Mar 30 2015 11:34:19 EDT Ventricular Rate:  69 PR Interval:  192 QRS Duration: 84 QT Interval:  382 QTC Calculation: 409 R Axis:   69 Text Interpretation:  Sinus rhythm with occasional Premature ventricular  complexes and Premature atrial complexes Nonspecific ST and T wave  abnormality Abnormal ECG no significant change since Jan 2016 Confirmed by  Regenia Skeeter  MD, [place] ([id number]) on 03/30/2015 11:43:37 AM      MDM   Final diagnoses:  Hypokalemia    Patient with worsening hypokalemia despite being on oral replacement, likely due to increase in volume of stools. Given her age and comorbidities with significant hypokalemia will treat with IV potassium and admit to the family medicine service. No EKG changes at this time.    Sherwood Gambler, MD 03/30/15 [phone]

## 2015-03-30 NOTE — ED Notes (Signed)
LAB CALLED WITH K+ 2.4.  DR. Regenia Skeeter ADVISED.

## 2015-03-30 NOTE — ED Notes (Signed)
Pt here for chronic diarrhea. sts x 1 year. sts they started her on flagyl this past wednesday and its worse. sts watery diarrhea x 3 today.

## 2015-03-30 NOTE — H&P (Signed)
[facility] Admission History and Physical Service Pager: [phone]  Patient name: Linda Macias Medical record number: [id number] Date of birth: 04-05-1923 Age: 81 y.o. Gender: female  Primary Care Provider: Kenn File, MD Consultants: None Code Status: DNR  Chief Complaint: Weakness and Diarrhea.   Assessment and Plan: Linda Macias is a 81 y.o. female presenting with four days of worsening diarrhea, dehydration and weakness. PMH is significant for CVA, HTN, HLD, Chronic Diarrhea, Chronic Hypokalemia, Microcytic Anemia.   # Hypokalemia - Pt. Here with predominantly lower extremity weakness with attempts to stand. Hypokalemia to 2.4 by laboratory analysis. Has been on outpatient potassium supplementation, but began having worsening of her diarrhea over the past four days. EKG with T-wave flattening, but otherwise no acute changes, normal QT interval. This is a chronic problem related to her chronic diarrhea. Bicarb is 26.  - Admitted to telemetry under Dr. Mingo Amber - Trend BMET's - IV potassium x 4 runs today.  - Will continue repletion as indicated. - Monitor Tele for arrhythmias,  - Check mg, phos - Discontinue Chlorthalidone for HTN.  - related to GI loss, and needs management of her chronic diarrhea. If this is in question, we could check a urine potassium. If it is low this would confirm renal conservation for GI losses and argue against RTA.   # Chronic Diarrhea -  Pt. Has had chronic diarrhea since 10/2014. Has had a thorough workup by GI at [facility]. EGD was negative. C-Scope in 07/2014 negative for lesions (no mucosal biopsies taken), RUQ U/S with evidence of ? Gallbladder polyps. CT abdomen benign / normal pancreas. CTA of mesentery normal. PET scan of mesentery normal. Hydrogen Breath Test was markedly elevated and pt. Thought to have severe small bowel bacterial overgrowth. Started on Xifaxin, but insurance has since stopped paying for it.  Mcarthur Rossetti has helped her the most of all of the therapies that she has tried. Since coming off of it, she has had worsening diarrhea. 4 days ago, she started flagyl instead which made her diarrhea acutely worse up to multiple times / day and mostly after meals. She began experiencing weakness and came to the ED. It is unclear if other tests including fecal fat, fecal leukocytes, C.Diff, etc. Have been performed as a part of her workup. She continues to lose weight and it is clear that nutritional support / GI absorption needs to be a top priority for her.  - Loperamide home dose - C. Diff - Will consider further GI workup here, though may not be of any utility as she is being followed and worked up at [facility]. Consider GI consult.  - Discuss options regarding Xifaxin. Case management consulted.  - D/C flagyl for now.  - Will follow her symptomatically.    # Malnutrition - No weights here, but 9 lb. Weight loss from 12/2014 to 01/2015 documented in GI note at [facility] and pt reports 51 lbs down from Dec. Will f/u her weights here. She has severe temporal, clavicular, and calf wasting. Awaiting albumin for further characterization of her nutritional status. Most likely related to poor GI absorption 2/2 chronic diarrhea.  - Nutrition consult - Pending CMP / Albumin / Synthetic function / prealbumin - Ensure supplementation.  - multivitamin.  - PT/OT  # Iron Deficiency Anemia - Pt. With Hgb of 8.6 here, MCV 70.7. Downtrending from [place] in January. Has been on Iron supplementation at home, ?noncompliance due to fear of constipation. Makes me question her ability  to absorb iron given her chronic diarrhea. Diff is normal. WBC normal. Pt. Is weak at this time, but this may mostly be due to hypokalemia, poor absorption / nutritional deficiency, Anemia.  - Consider giving IV iron here.  - Repeat iron studies.  - TSH - Mg  - phos  # HTN  - Normotensive here. Holding home BP meds for now. Need to discontinue  chlorthalidone. Atenolol is her second med.   # HLD - Elevated lipids in 2014. No lipid panel since and pt. Not on statin. Will check lipids here as an overall part of her nutritional workup. Very high threshold to start a statin.   FEN/GI: IVF NS 75cc/ hr. / Regular Diet / Nutritional supplementation.   Prophylaxis: Lovenox.   Disposition: Pending repletion of potassium and further workup.   History of Present Illness: Linda Macias is a 81 y.o. female presenting with worsening / more frequent diarrhea over the past four days after starting metronidazole. She has chronic diarrhea, but her BM's had improved with Xifaxin / titrated down to 1-2 soft BM's per day. However, her coverage for Xifaxin ran out and she was unable to continue on it. She has since had an increase in the frequency of her diarrhea, and more acutely a significant increase over the last four days. She has become increasingly weak, and has continued to lose weight. She says that she has lost more than 50 pounds in the past year. She has not had fever, or chills at home, she has intermittent abdominal pain. She has not had nausea or vomiting. No change in the character of her stool. She says that her main complaint is the weakness. She says that her legs feel like "jello" or like she's going to "melt" whenever she tries to stand up. She feels her weakness is  Mostly in her legs. She has not had any other sensory or motor deficits, no recent illnesses. She says she has continued to follow GI at [facility]. She is not taking any laxatives at home.   Review Of Systems: Per HPI with the following additions: None Otherwise 12 point review of systems was performed and was unremarkable.  Patient Active Problem List   Diagnosis Date Noted  . Protein-calorie malnutrition, severe 11/25/2014  . Dehydration   . Orthostatic hypotension   . Weakness 11/24/2014  . Microcytic anemia 10/10/2014  . Hypokalemia 10/10/2014  . Diarrhea  10/09/2014  . Abdominal pain, epigastric 05/28/2014  . Hearing loss 01/07/2014  . Cervical spondylosis without myelopathy 10/18/2013  . Ingrown toenail 10/04/2013  . Constipation 09/15/2012  . Well woman exam 02/15/2012  . Carpal tunnel syndrome 06/30/2011  . Vaginal pain 01/11/2011  . ANEMIA, SECONDARY TO ACUTE BLOOD LOSS 09/04/2009  . HIP JOINT REPLACEMENT BY OTHER MEANS 06/23/2009  . DEGENERATIVE JOINT DISEASE 04/23/2008  . OSTEOARTHROSIS UNSPEC WHETHER GEN/LOCALIZED HAND 01/05/2008  . ARTHRITIS, CERVICAL SPINE 03/09/2007  . COLON POLYP 12/29/2006  . HYPERCHOLESTEROLEMIA 12/29/2006  . OBESITY, NOS 12/29/2006  . HYPERTENSION, BENIGN SYSTEMIC 12/29/2006  . CVA 12/29/2006   Past Medical History: Past Medical History  Diagnosis Date  . Hyperlipidemia   . Hypertension   . Osteoarthritis, knee/lower leg   . CVA (cerebral infarction)     No residuals, cannot tolerate statins   . Arthritis    Past Surgical History: Past Surgical History  Procedure Laterality Date  . Total hip arthroplasty  10/10   Social History: History  Substance Use Topics  . Smoking status: Never  Smoker   . Smokeless tobacco: Never Used  . Alcohol Use: No   Additional social history: none  Please also refer to relevant sections of EMR.  Family History: Family History  Problem Relation Age of Onset  . Cancer Mother   . Heart disease Mother   . Cancer Sister     esophagus  . Cancer Sister     colon  . Cancer Sister     abdomin   Allergies and Medications: Allergies  Allergen Reactions  . Ciprofloxacin Other (See Comments)    Diarrhea for 37 days   No current facility-administered medications on file prior to encounter.   Current Outpatient Prescriptions on File Prior to Encounter  Medication Sig Dispense Refill  . mometasone (NASONEX) 50 MCG/ACT nasal spray Place 2 sprays into the nose daily. 17 g 12  . potassium chloride SA (K-DUR,KLOR-CON) 20 MEQ tablet Take 1 tablet (20 mEq total)  by mouth daily. 60 tablet 3  . ferrous sulfate 325 (65 FE) MG EC tablet Take 1 tablet (325 mg total) by mouth daily with breakfast. (Patient not taking: Reported on 11/24/2014) 30 tablet 3    Objective: BP 109/58 mmHg  Pulse 65  Temp(Src) 98.1 F (36.7 C) (Oral)  Resp 11  SpO2 100% Exam: General: NAD, AAOx3, Cachectic, Resting comfortably in bed.  Eyes: EOMI, PERRLA ENTM: Nares patent, MMM Neck: FROM Cardiovascular: RRR, no MGR, normal S1/S2, 2+ distal pulses.  Respiratory: CTA bilaterally, appropriate rate, unlabored.  Abdomen: S, NT, ND, +BS, no organomegaly.  MSK: MAEW, muscle wasting noted over her entire body. No TTP Skin: No rashes, no lesions Neuro: AAOx3, No focal deficits grossly. 5/5 strength in upper / lower extremities, Reflexes 2+.  Psych: Appropriate mood / affect.   Labs and Imaging: CBC BMET   Recent Labs Lab 03/30/15 1244  WBC 6.0  HGB 8.6*  HCT 26.1*  PLT 270    Recent Labs Lab 03/30/15 1244  NA 136  K 2.4*  CL 101  CO2 26  BUN 22*  CREATININE 0.85  GLUCOSE 156*  CALCIUM 8.9     EKG - NSR, flattened t-waves, no acute changes. PVC x1, PAC x1. QTc 458msec. No change from prior  Aquilla Hacker, MD 03/30/2015, 5:46 PM PGY-1, [place] Intern pager: [phone], text pages welcome   I have seen and evaluated the patient with Dr. Minda Ditto. I am in agreement with the note above in its revised form. My additions are in red.  Kunio Cummiskey B. Bonner Puna, MD, PGY-2 03/30/2015 7:07 PM

## 2015-03-31 ENCOUNTER — Encounter (HOSPITAL_COMMUNITY): Payer: Self-pay | Admitting: *Deleted

## 2015-03-31 LAB — CBC
HCT: 24.1 % — ABNORMAL LOW (ref 36.0–46.0)
Hemoglobin: 8 g/dL — ABNORMAL LOW (ref 12.0–15.0)
MCH: 23.3 pg — ABNORMAL LOW (ref 26.0–34.0)
MCHC: 33.2 g/dL (ref 30.0–36.0)
MCV: 70.3 fL — ABNORMAL LOW (ref 78.0–100.0)
Platelets: 274 10*3/uL (ref 150–400)
RBC: 3.43 MIL/uL — ABNORMAL LOW (ref 3.87–5.11)
RDW: 14.9 % (ref 11.5–15.5)
WBC: 5.8 10*3/uL (ref 4.0–10.5)

## 2015-03-31 LAB — BASIC METABOLIC PANEL
Anion gap: 5 (ref 5–15)
Anion gap: 4 — ABNORMAL LOW (ref 5–15)
BUN: 13 mg/dL (ref 6–20)
BUN: 10 mg/dL (ref 6–20)
CO2: 24 mmol/L (ref 22–32)
CO2: 23 mmol/L (ref 22–32)
Calcium: 8 mg/dL — ABNORMAL LOW (ref 8.9–10.3)
Calcium: 7.8 mg/dL — ABNORMAL LOW (ref 8.9–10.3)
Chloride: 108 mmol/L (ref 101–111)
Chloride: 107 mmol/L (ref 101–111)
Creatinine, Ser: 0.64 mg/dL (ref 0.44–1.00)
Creatinine, Ser: 0.7 mg/dL (ref 0.44–1.00)
GFR calc Af Amer: >60 mL/min (ref >60)
GFR calc Af Amer: >60 mL/min (ref >60)
GFR calc non Af Amer: >60 mL/min (ref >60)
GFR calc non Af Amer: >60 mL/min (ref >60)
Glucose, Bld: 70 mg/dL (ref 65–99)
Glucose, Bld: 171 mg/dL — ABNORMAL HIGH (ref 65–99)
Potassium: 2.6 mmol/L — CL (ref 3.5–5.1)
Potassium: 3.2 mmol/L — ABNORMAL LOW (ref 3.5–5.1)
Sodium: 137 mmol/L (ref 135–145)
Sodium: 134 mmol/L — ABNORMAL LOW (ref 135–145)

## 2015-03-31 LAB — URINALYSIS, ROUTINE W REFLEX MICROSCOPIC
Bilirubin Urine: NEGATIVE
Glucose, UA: NEGATIVE mg/dL
Hgb urine dipstick: NEGATIVE
Ketones, ur: NEGATIVE mg/dL
Nitrite: POSITIVE — AB
Protein, ur: NEGATIVE mg/dL
Specific Gravity, Urine: 1.015 (ref 1.005–1.030)
Urobilinogen, UA: 0.2 mg/dL (ref 0.0–1.0)
pH: 5 (ref 5.0–8.0)

## 2015-03-31 LAB — RETICULOCYTES
RBC.: 3.43 MIL/uL — ABNORMAL LOW (ref 3.87–5.11)
Retic Count, Absolute: 30.9 10*3/uL (ref 19.0–186.0)
Retic Ct Pct: 0.9 % (ref 0.4–3.1)

## 2015-03-31 LAB — IRON AND TIBC
Iron: 21 ug/dL — ABNORMAL LOW (ref 28–170)
Saturation Ratios: 12 % (ref 10.4–31.8)
TIBC: 179 ug/dL — ABNORMAL LOW (ref 250–450)
UIBC: 158 ug/dL

## 2015-03-31 LAB — MAGNESIUM: Magnesium: 2 mg/dL (ref 1.7–2.4)

## 2015-03-31 LAB — URINE MICROSCOPIC-ADD ON

## 2015-03-31 LAB — CLOSTRIDIUM DIFFICILE BY PCR: Toxigenic C. Difficile by PCR: NEGATIVE

## 2015-03-31 LAB — PREALBUMIN: Prealbumin: 8.1 mg/dL — ABNORMAL LOW (ref 18–38)

## 2015-03-31 LAB — FERRITIN: Ferritin: 293 ng/mL (ref 11–307)

## 2015-03-31 MED ORDER — POTASSIUM CHLORIDE 10 MEQ/100ML IV SOLN
10.0000 meq | INTRAVENOUS | Status: AC
  Administered 2015-03-31 (×6): 10 meq via INTRAVENOUS
  Filled 2015-03-31 (×6): qty 100

## 2015-03-31 MED ORDER — RIFAXIMIN 550 MG PO TABS
550.0000 mg | ORAL_TABLET | Freq: Three times a day (TID) | ORAL | Status: DC
  Administered 2015-03-31 – 2015-04-03 (×11): 550 mg via ORAL
  Filled 2015-03-31 (×12): qty 1

## 2015-03-31 MED ORDER — SODIUM CHLORIDE 0.9 % IV SOLN
510.0000 mg | Freq: Once | INTRAVENOUS | Status: AC
  Administered 2015-03-31: 510 mg via INTRAVENOUS
  Filled 2015-03-31: qty 17

## 2015-03-31 MED ORDER — PRO-STAT SUGAR FREE PO LIQD
30.0000 mL | Freq: Three times a day (TID) | ORAL | Status: DC
  Administered 2015-03-31 (×2): 30 mL via ORAL
  Filled 2015-03-31 (×11): qty 30

## 2015-03-31 MED ORDER — POTASSIUM CHLORIDE CRYS ER 20 MEQ PO TBCR
40.0000 meq | EXTENDED_RELEASE_TABLET | Freq: Two times a day (BID) | ORAL | Status: DC
  Administered 2015-03-31 – 2015-04-01 (×2): 40 meq via ORAL
  Filled 2015-03-31 (×3): qty 2

## 2015-03-31 MED ORDER — POTASSIUM CHLORIDE CRYS ER 20 MEQ PO TBCR
40.0000 meq | EXTENDED_RELEASE_TABLET | Freq: Once | ORAL | Status: AC
  Administered 2015-03-31: 40 meq via ORAL
  Filled 2015-03-31: qty 2

## 2015-03-31 MED ORDER — ENSURE ENLIVE PO LIQD: 237.0000 mL | Freq: Two times a day (BID) | ORAL | Status: DC

## 2015-03-31 MED ORDER — DICLOFENAC SODIUM 1 % TD GEL
4.0000 g | Freq: Four times a day (QID) | TRANSDERMAL | Status: DC
  Administered 2015-03-31 – 2015-04-03 (×11): 4 g via TOPICAL
  Filled 2015-03-31: qty 100

## 2015-03-31 NOTE — Evaluation (Signed)
Physical Therapy Evaluation Patient Details Name: Linda Macias MRN: [id number] DOB: 1922-11-19 Today's Date: 03/31/2015   History of Present Illness  Pt adm with hypokalemia and weakness due to chronic diarrhea.   Clinical Impression  Pt admitted with above diagnosis and presents to PT with functional limitations due to deficits listed below (See PT problem list). Pt needs skilled PT to maximize independence and safety to allow discharge to home with support of family.      Follow Up Recommendations No PT follow up    Equipment Recommendations  None recommended by PT    Recommendations for Other Services       Precautions / Restrictions Precautions Precautions: Fall      Mobility  Bed Mobility Overal bed mobility: Modified Independent                Transfers Overall transfer level: Needs assistance Equipment used: Rolling walker (2 wheeled) Transfers: Sit to/from Stand Sit to Stand: Min guard         General transfer comment: Assist for balance and support  Ambulation/Gait Ambulation/Gait assistance: Min guard Ambulation Distance (Feet): 150 Feet Assistive device: Rolling walker (2 wheeled) Gait Pattern/deviations: Step-through pattern;Decreased stride length     General Gait Details: Assist for safety and stability  Stairs            Wheelchair Mobility    Modified Rankin (Stroke Patients Only)       Balance Overall balance assessment: Needs assistance Sitting-balance support: No upper extremity supported;Feet supported Sitting balance-Leahy Scale: Normal     Standing balance support: No upper extremity supported;During functional activity Standing balance-Leahy Scale: Fair                               Pertinent Vitals/Pain Pain Assessment: No/denies pain    Home Living Family/patient expects to be discharged to:: Private residence Living Arrangements: Children Available Help at Discharge: Family Type of Home:  House Home Access: Level entry     Home Layout: Two level;Able to live on main level with bedroom/bathroom Home Equipment: Bedside commode;Hospital bed;Cane - single point;Walker - 2 wheels      Prior Function Level of Independence: Needs assistance   Gait / Transfers Assistance Needed: Amb with rolling walker           Hand Dominance   Dominant Hand: Right    Extremity/Trunk Assessment   Upper Extremity Assessment: Defer to OT evaluation           Lower Extremity Assessment: Generalized weakness         Communication   Communication: No difficulties  Cognition Arousal/Alertness: Awake/alert Behavior During Therapy: WFL for tasks assessed/performed Overall Cognitive Status: Within Functional Limits for tasks assessed                      General Comments      Exercises        Assessment/Plan    PT Assessment Patient needs continued PT services  PT Diagnosis Difficulty walking;Generalized weakness   PT Problem List Decreased strength;Decreased activity tolerance;Decreased balance;Decreased mobility  PT Treatment Interventions DME instruction;Gait training;Functional mobility training;Therapeutic activities;Therapeutic exercise;Balance training;Patient/family education   PT Goals (Current goals can be found in the Care Plan section) Acute Rehab PT Goals Patient Stated Goal: Return home. Get rid of diarrhea. PT Goal Formulation: With patient Time For Goal Achievement: 04/07/15 Potential to Achieve Goals: Good    Frequency  Min 3X/week   Barriers to discharge        Co-evaluation               End of Session   Activity Tolerance: Patient tolerated treatment well Patient left: in bed;with call bell/phone within reach Nurse Communication: Mobility status         Time: 1510-1531 PT Time Calculation (min) (ACUTE ONLY): 21 min   Charges:   PT Evaluation $Initial PT Evaluation Tier I: 1 Procedure     PT G Codes:         Brigido Mera April 17, 2015, 3:59 PM  [facility] PT [phone]

## 2015-03-31 NOTE — Progress Notes (Signed)
Utilization Review completed. Renel Ende RN BSN CM 

## 2015-03-31 NOTE — Progress Notes (Signed)
Initial Nutrition Assessment  DOCUMENTATION CODES:  Severe malnutrition in context of chronic illness  INTERVENTION:  30 ml Prostat TID, each supplement provides 100 kcals and 15 grams of protein  NUTRITION DIAGNOSIS:  Malnutrition related to altered GI function, chronic illness as evidenced by severe depletion of body fat, severe depletion of muscle mass, percent weight loss.   GOAL:  Patient will meet greater than or equal to 90% of their needs   MONITOR:  PO intake, Supplement acceptance, Labs, Weight trends, Skin, I & O's  REASON FOR ASSESSMENT:  Malnutrition Screening Tool, Consult Assessment of nutrition requirement/status  ASSESSMENT: Linda Macias is a 81 y.o. female presenting with four days of worsening diarrhea, dehydration and weakness. PMH is significant for CVA, HTN, HLD, Chronic Diarrhea, Chronic Hypokalemia, Microcytic Anemia.   Pt reports a general decline in health over the past year, particularly in the past 6 months, due to GI issues. She reveals that she is followed by [facility] as an outpatient as most recent work-up revealed that she had "an intestinal bacteria which spread throughout my body"; she was prescribed medication, which improved her symptoms (she reveals she only had 2 BMs daily vs more than 5 daily when taking the medication), but unfortunately, she was unable to continue with this medication due to it no longer being covered by insurance.   She reports "I have a good appetite and I eat, but everything runs through me". She consumed all of her breakfast expect the oatmeal and a few bites of eggs. Noted pt refused Ensure this morning- pt reports Ensure, Boost, and Resource Breeze all give her diarrhea, as well as anything with milk. She estimates she has lost 50# in the past year due to poor oral intake. Wt hx reveals a 21# (16.7%) wt loss in the past 6 months.   Nutrition-Focused physical exam completed. Findings are severe fat depletion,  severe muscle depletion, and no edema. Pt reveals it has been harder for her to get around due to weakness.   Reviewed menu selections with pt for food options that appeal to her. She is also amenable to try Prostat supplement. Discussed importance of good PO intake to promote healing.   Labs reviewed. K: 2.6 (on supplement) , Phos: 2.0. Mg: WDL.   Height:  Ht Readings from Last 1 Encounters:  03/31/15 5' (1.524 m)    Weight:  Wt Readings from Last 1 Encounters:  03/31/15 105 lb 6.1 oz (47.8 kg)    Ideal Body Weight:  45.5 kg  Wt Readings from Last 10 Encounters:  03/31/15 105 lb 6.1 oz (47.8 kg)  11/24/14 119 lb 4.8 oz (54.114 kg)  11/07/14 118 lb 14.4 oz (53.933 kg)  10/09/14 126 lb 8 oz (57.38 kg)  08/03/14 140 lb (63.504 kg)  06/07/14 148 lb (67.132 kg)  05/28/14 148 lb 9.6 oz (67.405 kg)  05/07/14 151 lb (68.493 kg)  10/17/13 162 lb (73.483 kg)  10/04/13 162 lb (73.483 kg)    BMI:  Body mass index is 20.58 kg/(m^2).  Estimated Nutritional Needs:  Kcal:  1400-1600  Protein:  60-70 grams  Fluid:  1.4-1.6  Skin:  Reviewed, no issues  Diet Order:  Diet regular Room service appropriate?: Yes; Fluid consistency:: Thin  EDUCATION NEEDS:  Education needs addressed   Intake/Output Summary (Last 24 hours) at 03/31/15 1202 Last data filed at 03/31/15 0600  Gross per 24 hour  Intake   1600 ml  Output    300 ml  Net  1300 ml    Last BM:  03/31/15  Aizley Stenseth A. Jimmye Norman, RD, LDN, CDE Pager: [phone] After hours Pager: [phone]

## 2015-03-31 NOTE — Discharge Summary (Signed)
[facility] Discharge Summary  Patient name: Linda Macias Medical record number: [id number] Date of birth: 28-May-1923 Age: 81 y.o. Gender: female Date of Admission: 03/30/2015  Date of Discharge: 04/03/2015 Admitting Physician: Alveda Reasons, MD  Primary Care Provider: Kenn File, MD Consultants: gastroenterology  Indication for Hospitalization: hypokalemia 2/2 chronic diarrhea  Discharge Diagnoses/Problem List:  Diarrhea Hypokalemia Protein-calorie malnutrition  Disposition: Discharge home  Discharge Condition: Stable  Discharge Exam:  Temp: [97.9 F (36.6 C)-98.4 F (36.9 C)] 97.9 F (36.6 C) (06/02 1638) Pulse Rate: [62-77] 62 (06/02 0613) Resp: [18-20] 18 (06/02 0613) BP: (113-116)/(56-66) 113/64 mmHg (06/02 0613) SpO2: [100 %] 100 % (06/02 0613) Weight: [105 lb 13.1 oz (48 kg)] 105 lb 13.1 oz (48 kg) (06/02 4536) Physical Exam: General: awake, alert, thin, NAD, daughter at bedside Cardiovascular: RRR, no murmurs, +1 radial pulses Respiratory: CTAB, no increased WOB Abdomen: flat, soft, NT/ND, +BS Extremities: thin, warm Neuro: AOx3, follows commands, speech normal Skin: dry, no rashes Psych: speech normal, mood stable  Brief Hospital Course:  Linda Macias is a 81 y.o. female that presented with four days of worsening diarrhea, dehydration and weakness. PMH is significant for CVA, HTN, HLD, Chronic Diarrhea, Chronic Hypokalemia, Microcytic Anemia.   On admission, patient was noted to be hypokalemic to 2.4.  EKG with T wave flattening.  In addition, she exhibited LE weakness.  She was placed on telemetry and her potassium was repleted.  Patient's gastroenterologist was consulted, who recommended resuming the Xifaxin.  No further GI workup was recommended,as patient had an extensive GI workup with both [facility] and here in [place].  Patient's electrolytes were monitored closely and imbalances were corrected.  She  tolerated PO well during hospitalization.  Patient's diarrhea improved remarkably after restarting the Xifaxin.  An appeal was sent to patient's insurance asking for coverage of Xifaxin.  In the meantime, she was discharged with a short supply of samples by GI.  Patient was evaluated by PT, who recommended home health PT.  This was also arranged for patient prior to discharge.  Of note, C Diff was negative.   Patient was noted to have iron deficiency anemia with hgb 8.6, MVC 70.7.  She was given IV iron inpatient.  Her hemoglobin was trended.  Patient was noted to be normotensive on admission.  He chlorthalidone was discontinued in the setting of hypokalemia.  She was continued on her Atenolol.    She was discharged in stable condition into the care of her daughter.  Discharge instructions and return precautions were reviewed.  Patient and family voiced good understanding.  Patient to follow up as scheduled for care and laboratory testing.  Issues for Follow Up:  1. Diarrhea 2. Xifaxin affordability 3. Recommend recheck K, Hgb, lab appointment made 6/6 4. Blood pressure, chlorthalidone dc'd  Significant Procedures: none  Significant Labs and Imaging:   Recent Labs Lab 03/31/15 0540 04/01/15 0434 04/03/15 0645  WBC 5.8 5.6 4.6  HGB 8.0* 8.0* 7.9*  HCT 24.1* 24.0* 24.4*  PLT 274 283 302    Recent Labs Lab 03/30/15 1913 03/31/15 0540 03/31/15 1605 04/01/15 0434 04/02/15 0710 04/03/15 0645  NA 138 137 134* 138 138 136  K 2.8* 2.6* 3.2* 3.6 4.9 4.2  CL 106 108 107 108 110 107  CO2 23 24 23 22 22 23   GLUCOSE 88 70 171* 78 71 71  BUN 18 13 10 7  <5* <5*  CREATININE 0.69 0.64 0.70 0.59 0.61 0.57  CALCIUM  8.4* 8.0* 7.8* 8.3* 8.5* 8.3*  MG 1.4* 2.0  --   --  1.8  --   PHOS 2.0*  --   --  1.4* 1.2* 2.5  ALKPHOS 56  --   --   --   --   --   AST 12*  --   --   --   --   --   ALT 8*  --   --   --   --   --   ALBUMIN 2.5*  --   --  2.2* 2.2* 2.2*    Results/Tests Pending at Time  of Discharge: none  Discharge Medications:    Medication List    STOP taking these medications        chlorthalidone 25 MG tablet  Commonly known as:  HYGROTON     metroNIDAZOLE 500 MG tablet  Commonly known as:  FLAGYL      TAKE these medications        atenolol 50 MG tablet  Commonly known as:  TENORMIN  Take 50 mg by mouth daily.     ferrous sulfate 325 (65 FE) MG EC tablet  Take 1 tablet (325 mg total) by mouth daily with breakfast.     loperamide 2 MG capsule  Commonly known as:  IMODIUM  Take 2 mg by mouth 3 (three) times daily as needed for diarrhea or loose stools.     mometasone 50 MCG/ACT nasal spray  Commonly known as:  NASONEX  Place 2 sprays into the nose daily.     MULTI ADULT GUMMIES Chew  Chew 2 tablets by mouth daily.     potassium chloride SA 20 MEQ tablet  Commonly known as:  K-DUR,KLOR-CON  Take 1 tablet (20 mEq total) by mouth daily.     rifaximin 550 MG Tabs tablet  Commonly known as:  XIFAXAN  Take 1 tablet (550 mg total) by mouth 3 (three) times daily.     Vitamin D3 5000 UNITS Tabs  Take 5,000 Units by mouth daily.        Discharge Instructions: Please refer to Patient Instructions section of EMR for full details.  Patient was counseled important signs and symptoms that should prompt return to medical care, changes in medications, dietary instructions, activity restrictions, and follow up appointments.   Follow-Up Appointments: Follow-up Information    Follow up with Kenn File, MD. Go on 04/10/2015.   Specialty:  Family Medicine   Why:  3:30pm hospital follow up   Contact information:   [place] [address] [phone]       Follow up with [place].   Why:  [place] [facility]. Will call and set up first home visit in the next 24 to 48 hours.   Contact information:   [address] [place] [address] [phone]       Follow up with Cleotis Nipper, MD In 1 month.    Specialty:  Gastroenterology   Contact information:   [address] [place] [address] [phone]       Follow up with [place]. Go on 04/07/2015.   Why:  3:00 for lab visit   Contact information:   [place]      Follow up with Cleotis Nipper, MD.   Specialty:  Gastroenterology   Why:  go by office tonight or tomorrow am for samples of xifaxan. spoke with  daughter Katharine Look, she will pick up tonight.   Contact information:   [address] [place] [address] [phone]       Janora Norlander, DO 04/05/2015, 7:18 AM PGY-1, [place]

## 2015-03-31 NOTE — Progress Notes (Signed)
OT Cancellation Note  Patient Details Name: Linda Macias MRN: [id number] DOB: 1923/02/13   Cancelled Treatment:    Reason Eval/Treat Not Completed: Fatigue/lethargy limiting ability to participate PT just attempted to see pt and pt reporting she is fatigued right now and would like to rest. She requests to be checked on later time. Will reattempt later today or next date.  [place], [place] 03/31/2015, 12:01 PM

## 2015-03-31 NOTE — Progress Notes (Signed)
Family Medicine Teaching Service Daily Progress Note Intern Pager: [phone]  Patient name: Linda Macias Medical record number: [id number] Date of birth: 07-02-1923 Age: 81 y.o. Gender: female  Primary Care Provider: Kenn File, MD Consultants: gastroenterology Code Status: DNR  Pt Overview and Major Events to Date:  05/29: Admitted  Assessment and Plan: Linda Macias is a 81 y.o. female presenting with four days of worsening diarrhea, dehydration and weakness. PMH is significant for CVA, HTN, HLD, Chronic Diarrhea, Chronic Hypokalemia, Microcytic Anemia.   # Hypokalemia - Pt. Here with predominantly lower extremity weakness with attempts to stand. Hypokalemia to 2.4 by laboratory analysis. Has been on outpatient potassium supplementation, but began having worsening of her diarrhea over the past four days. EKG with T-wave flattening, but otherwise no acute changes, normal QT interval. This is a chronic problem related to her chronic diarrhea. Bicarb is 24. K 2.6 - telemetry - Trend BMET's - s/p IV potassium x 3, will repeat IV K 25mEq x6 today - Will continue repletion as indicated. - Discontinue Chlorthalidone for HTN.  - likely related to GI loss, and needs management of her chronic diarrhea. If this is in question, we could check a urine potassium. If it is low this would confirm renal conservation for GI losses and argue against RTA.   # Chronic Diarrhea - Pt. Has had chronic diarrhea since 10/2014. Has had a thorough workup by GI at [facility]. EGD was negative. C-Scope in 07/2014 negative for lesions (no mucosal biopsies taken), RUQ U/S with evidence of ? Gallbladder polyps. CT abdomen benign / normal pancreas. CTA of mesentery normal. PET scan of mesentery normal. Hydrogen Breath Test was markedly elevated and pt. Thought to have severe small bowel bacterial overgrowth. Started on Xifaxin, but insurance has since stopped paying for it. Mcarthur Rossetti has helped her the most of all of  the therapies that she has tried. Since coming off of it, she has had worsening diarrhea. 4 days ago, she started flagyl instead which made her diarrhea acutely worse up to multiple times / day and mostly after meals.  It is unclear if other tests including fecal fat, fecal leukocytes, C.Diff, etc. have been performed as a part of her workup. She continues to lose weight and it is clear that nutritional support / GI absorption needs to be a top priority for her. C. Diff negative - Loperamide home dose - c/s GI this am, Dr Cristina Gong happens to be on for [facility] today - Discuss options regarding Xifaxin. Case management consulted.  - D/C flagyl for now.  - Will follow her symptomatically.   # Malnutrition - No weights here, but 9 lb. Weight loss from 12/2014 to 01/2015 documented in GI note at [facility] and pt reports 51 lbs down from Dec. Will f/u her weights here. She has severe temporal, clavicular, and calf wasting. Awaiting albumin for further characterization of her nutritional status. Most likely related to poor GI absorption 2/2 chronic diarrhea.  - Nutrition consult - Pending CMP / Albumin / Synthetic function / prealbumin - Ensure supplementation.  - multivitamin.  - PT/OT - f/u am Mg  # Iron Deficiency Anemia - Pt. With Hgb of 8.6 here, MCV 70.7. Downtrending from discharge in January. Has been on Iron supplementation at home, ?noncompliance due to fear of constipation. Fe 21, TIBC 179, ferritin 293, retic 0.9% TSH 1.591, Mg 1.4, Phos 2.0 - Consider giving IV iron here.   # HTN - Normotensive. Holding home BP meds for now. Need  to discontinue chlorthalidone. Atenolol is her second med.   # HLD - Elevated lipids in 2014. No lipid panel since and pt. Not on statin. FLP normal  FEN/GI: IVF NS 75cc/ hr. / Regular Diet / Nutritional supplementation.  Prophylaxis: Lovenox.   Disposition: Pending repletion of potassium and further workup.   Subjective:  Patient reports that she is feeling  improved today.  She reports that she is still a little dizzy when she stands but that she feels significantly steadier on her feet today and was able to ambulate to the restroom this am.  She continues to have diarrhea intermittently, reporting that diarrhea is associated with fluid intake.  Diarrhea floats.  No blood in stool.  She is able to tolerate solids without difficulty.  Denies nausea, vomiting, abdominal or rectal pain.    Daughter at bedside this am as well.  She reports that her mother has been on Xifaxin x3 cycles this year.  This helps with her symptoms but she is unable to afford it now 2/2 ins.  She reports that her mother has "had every test done", including fecal fat testing at [facility].  Per daughter, patient was a sz 16 last year and now is an 8.  Objective: Temp:  [98 F (36.7 C)-98.6 F (37 C)] 98.6 F (37 C) (05/30 0520) Pulse Rate:  [51-73] 58 (05/30 0520) Resp:  [11-18] 18 (05/30 0520) BP: (99-129)/(48-70) 113/48 mmHg (05/30 0520) SpO2:  [96 %-100 %] 100 % (05/30 0520) Weight:  [105 lb 6.1 oz (47.8 kg)] 105 lb 6.1 oz (47.8 kg) (05/30 0520) Physical Exam: General: awake, alert, thin, NAD, daughter at bedside Cardiovascular: RRR, NSR @ 71BPM on tele, no murmurs Respiratory: CTAB, no increased WOB Abdomen: flat, soft, NT/ND, +BS Extremities: thin, warm, +2DP Neuro: follows commands, speech normal  Laboratory:  Recent Labs Lab 03/30/15 1244 03/30/15 1913 03/31/15 0540  WBC 6.0 6.3 5.8  HGB 8.6* 8.6* 8.0*  HCT 26.1* 25.8* 24.1*  PLT 270 262 274    Recent Labs Lab 03/30/15 1244 03/30/15 1913 03/31/15 0540  NA 136 138 137  K 2.4* 2.8* 2.6*  CL 101 106 108  CO2 26 23 24   BUN 22* 18 13  CREATININE 0.85 0.69 0.64  CALCIUM 8.9 8.4* 8.0*  PROT  --  5.5*  --   BILITOT  --  0.4  --   ALKPHOS  --  56  --   ALT  --  8*  --   AST  --  12*  --   GLUCOSE 156* 88 70   Mg 1.4 Prealbumin 8.1  Iron/TIBC/Ferritin/ %Sat    Component Value Date/Time    IRON 21* 03/31/2015 0540   TIBC 179* 03/31/2015 0540   FERRITIN 293 03/31/2015 0540   IRONPCTSAT 12 03/31/2015 0540  Retic: 3.43  Imaging/Diagnostic Tests: none  Janora Norlander, DO 03/31/2015, 8:13 AM PGY-1, [place] Intern pager: [phone], text pages welcome

## 2015-03-31 NOTE — Consult Note (Signed)
Referring Provider: Dr. Hinton Rao Primary Care Physician:  Kenn File, MD Primary Gastroenterologist:  Dr. Cristina Gong  Reason for Consultation:  Diarrhea  HPI: Linda Macias is a 81 y.o. female was admitted to the hospital yesterday because of severe diarrhea.  The patient has been known to me for many years, primarily because of a recurring adenomatous lesion of the proximal colon which has been repeatedly treated colonoscopically.  However, for the past year or so, she has been bothered by intractable diarrhea, steatorrhea, and weight loss.  Duodenal mucosal biopsies were negative for celiac disease, Whipple's disease, amyloidosis, etc. Colonic biopsies came back negative for microscopic colitis. A CT of the abdomen and pelvis was negative for any secretory tumors.  The patient was seen at [facility], where breath hydrogen testing was available and there was evidence for small bowel bacterial overgrowth, so the patient was treated with repeated courses of rifaximin, with excellent results. When I last saw her in the office approximately a month ago, she had dropped from 10 bowel movements a day to 2 or 3 bowel movements a day, which were starting to be semi-formed in character. Her weight loss plateaued and, in fact, she actually started to gain a little bit of weight back. Then, however, the insurance coverage for the extremely expensive medication (rifaximin) ran out, and she was switched to Flagyl. Within several days, her diarrhea was back to baseline and she came in severely hypokalemic.   Past Medical History  Diagnosis Date  . Hyperlipidemia   . Hypertension   . Osteoarthritis, knee/lower leg   . CVA (cerebral infarction)     No residuals, cannot tolerate statins   . Arthritis     Past Surgical History  Procedure Laterality Date  . Total hip arthroplasty  10/10    Prior to Admission medications   Medication Sig Start Date End Date  Taking? Authorizing Provider  atenolol (TENORMIN) 50 MG tablet Take 50 mg by mouth daily.  09/29/14  Yes Historical Provider, MD  chlorthalidone (HYGROTON) 25 MG tablet Take 25 mg by mouth daily. 03/16/15  Yes Historical Provider, MD  Cholecalciferol (VITAMIN D3) 5000 UNITS TABS Take 5,000 Units by mouth daily.   Yes Historical Provider, MD  loperamide (IMODIUM) 2 MG capsule Take 2 mg by mouth 3 (three) times daily as needed for diarrhea or loose stools.   Yes Historical Provider, MD  metroNIDAZOLE (FLAGYL) 500 MG tablet Take 500 mg by mouth 2 (two) times daily. 10 day course started 03/26/15 03/25/15  Yes Historical Provider, MD  mometasone (NASONEX) 50 MCG/ACT nasal spray Place 2 sprays into the nose daily. 12/20/14  Yes Timmothy Euler, MD  Multiple Vitamins-Minerals (MULTI ADULT GUMMIES) CHEW Chew 2 tablets by mouth daily.   Yes Historical Provider, MD  potassium chloride SA (K-DUR,KLOR-CON) 20 MEQ tablet Take 1 tablet (20 mEq total) by mouth daily. 10/10/14  Yes Timmothy Euler, MD  ferrous sulfate 325 (65 FE) MG EC tablet Take 1 tablet (325 mg total) by mouth daily with breakfast. Patient not taking: Reported on 11/24/2014 10/10/14   Timmothy Euler, MD    Current Facility-Administered Medications  Medication Dose Route Frequency Provider Last Rate Last Dose  . 0.9 %  sodium chloride infusion   Intravenous Continuous Alveda Reasons, MD 10 mL/hr at 03/31/15 1346    . acetaminophen (TYLENOL) tablet 650 mg  650 mg Oral Q6H PRN Aquilla Hacker, MD       Or  .  acetaminophen (TYLENOL) suppository 650 mg  650 mg Rectal Q6H PRN Aquilla Hacker, MD      . diclofenac sodium (VOLTAREN) 1 % transdermal gel 4 g  4 g Topical QID Alveda Reasons, MD   4 g at 03/31/15 1346  . enoxaparin (LOVENOX) injection 40 mg  40 mg Subcutaneous Q24H Aquilla Hacker, MD   40 mg at 03/30/15 2200  . feeding supplement (PRO-STAT SUGAR FREE 64) liquid 30 mL  30 mL Oral TID BM Jenifer A Williams, RD   30 mL at  03/31/15 1345  . ferumoxytol (FERAHEME) 510 mg in sodium chloride 0.9 % 100 mL IVPB  510 mg Intravenous Once Ashly M Gottschalk, DO      . fluticasone (FLONASE) 50 MCG/ACT nasal spray 1 spray  1 spray Each Nare Daily Aquilla Hacker, MD   1 spray at 03/31/15 1003  . multivitamin liquid 5 mL  5 mL Oral Daily Alveda Reasons, MD   5 mL at 03/31/15 1003  . promethazine (PHENERGAN) tablet 12.5 mg  12.5 mg Oral Q6H PRN Aquilla Hacker, MD      . rifaximin Doreene Nest) tablet 550 mg  550 mg Oral TID Janora Norlander, DO   550 mg at 03/31/15 1243  . sodium chloride 0.9 % injection 3 mL  3 mL Intravenous Q12H Aquilla Hacker, MD   3 mL at 03/30/15 2200    Allergies as of 03/30/2015 - Review Complete 03/30/2015  Allergen Reaction Noted  . Ciprofloxacin Other (See Comments) 06/07/2014    Family History  Problem Relation Age of Onset  . Cancer Mother   . Heart disease Mother   . Cancer Sister     esophagus  . Cancer Sister     colon  . Cancer Sister     abdomin    History   Social History  . Marital Status: Widowed    Spouse Name: N/A  . Number of Children: 1  . Years of Education: 2 college   Occupational History  . Retired- Medical illustrator    Social History Main Topics  . Smoking status: Never Smoker   . Smokeless tobacco: Never Used  . Alcohol Use: No  . Drug Use: No  . Sexual Activity: Not on file   Other Topics Concern  . Not on file   Social History Narrative   Health Care POA:    Emergency Contact: daughter, Meiko Ives, (c) [phone]   End of Life Plan:    Who lives with you: self   Any pets: none   Diet: Pt has varied diet of protein, starch, and vegetables.  Pt reports eating fried food several times a week.   Exercise: Pt walks every day for 10-20 minutes.   Seatbelts: Pt reports wearing seatbelt when in vehicle.    Sun Exposure/Protection:    Hobbies: fishing, church          Review of Systems: See history of present illness.  Physical  Exam: Vital signs in last 24 hours: Temp:  [98 F (36.7 C)-98.6 F (37 C)] 98.6 F (37 C) (05/30 0520) Pulse Rate:  [58-73] 66 (05/30 1416) Resp:  [11-18] 16 (05/30 1416) BP: (99-129)/(48-70) 118/62 mmHg (05/30 1416) SpO2:  [96 %-100 %] 100 % (05/30 1416) Weight:  [47.8 kg (105 lb 6.1 oz)] 47.8 kg (105 lb 6.1 oz) (05/30 0520) Last BM Date: 03/30/15 At the time I came for a visit, the patient was sitting on the commode but  was okay with me talking with her. As consequence, however, I was unable to examine her in detail. She looks somewhat cachectic but is cognitively and neurologically intact, and her mood and affect appear normal.  Intake/Output from previous day: 05/29 0701 - 05/30 0700 In: 1600 [P.O.:600; I.V.:1000] Out: 300 [Urine:300] Intake/Output this shift:    Lab Results:  Recent Labs  03/30/15 1244 03/30/15 1913 03/31/15 0540  WBC 6.0 6.3 5.8  HGB 8.6* 8.6* 8.0*  HCT 26.1* 25.8* 24.1*  PLT 270 262 274   BMET  Recent Labs  03/30/15 1244 03/30/15 1913 03/31/15 0540  NA 136 138 137  K 2.4* 2.8* 2.6*  CL 101 106 108  CO2 26 23 24   GLUCOSE 156* 88 70  BUN 22* 18 13  CREATININE 0.85 0.69 0.64  CALCIUM 8.9 8.4* 8.0*   LFT  Recent Labs  03/30/15 1913  PROT 5.5*  ALBUMIN 2.5*  AST 12*  ALT 8*  ALKPHOS 56  BILITOT 0.4   PT/INR  Recent Labs  03/30/15 1913  LABPROT 16.7*  INR 1.34    Studies/Results: No results found.  Impression: 1. Chronic diarrhea most likely related to small bowel bacterial overgrowth, based on positive breath hydrogen test and prompt response to rifaximin. 2. Recurrence of diarrhea, associated with interruption in medical therapy because of financial considerations. 3. Hypokalemia, secondary to #2 4. Progressive weight loss, presumably related to documented stearrhea and malabsorption  Plan: Agree with resumption of rifaximin.   Possible social work consult for medication assistance; in the meantime, I will attempt  to contact local pharmaceutical representative to see if compassionate use samples can be provided.  3. I believe the patient will need much more potassium supplementation than has already been ordered. Her current low level is presumably reflective of significant deficits in intracellular stores, as well as depletion of serum potassium. I would recommend at least 80 mEq a day, and as much as 160 mEq a day, until the potassium is 4 or greater.   LOS: 1 day   Cleotis Nipper  03/31/2015, 3:24 PM   Pager [phone] If no answer or after 5 PM call [phone]

## 2015-04-01 DIAGNOSIS — E43 Unspecified severe protein-calorie malnutrition: Secondary | ICD-10-CM

## 2015-04-01 DIAGNOSIS — K529 Noninfective gastroenteritis and colitis, unspecified: Secondary | ICD-10-CM

## 2015-04-01 DIAGNOSIS — E876 Hypokalemia: Secondary | ICD-10-CM

## 2015-04-01 DIAGNOSIS — K6389 Other specified diseases of intestine: Secondary | ICD-10-CM

## 2015-04-01 LAB — RENAL FUNCTION PANEL
Albumin: 2.2 g/dL — ABNORMAL LOW (ref 3.5–5.0)
Anion gap: 8 (ref 5–15)
BUN: 7 mg/dL (ref 6–20)
CO2: 22 mmol/L (ref 22–32)
Calcium: 8.3 mg/dL — ABNORMAL LOW (ref 8.9–10.3)
Chloride: 108 mmol/L (ref 101–111)
Creatinine, Ser: 0.59 mg/dL (ref 0.44–1.00)
GFR calc Af Amer: >60 mL/min (ref >60)
GFR calc non Af Amer: >60 mL/min (ref >60)
Glucose, Bld: 78 mg/dL (ref 65–99)
Phosphorus: 1.4 mg/dL — ABNORMAL LOW (ref 2.5–4.6)
Potassium: 3.6 mmol/L (ref 3.5–5.1)
Sodium: 138 mmol/L (ref 135–145)

## 2015-04-01 LAB — CBC
HCT: 24 % — ABNORMAL LOW (ref 36.0–46.0)
Hemoglobin: 8 g/dL — ABNORMAL LOW (ref 12.0–15.0)
MCH: 23.3 pg — ABNORMAL LOW (ref 26.0–34.0)
MCHC: 33.3 g/dL (ref 30.0–36.0)
MCV: 70 fL — ABNORMAL LOW (ref 78.0–100.0)
Platelets: 283 10*3/uL (ref 150–400)
RBC: 3.43 MIL/uL — ABNORMAL LOW (ref 3.87–5.11)
RDW: 15 % (ref 11.5–15.5)
WBC: 5.6 10*3/uL (ref 4.0–10.5)

## 2015-04-01 MED ORDER — ADULT MULTIVITAMIN W/MINERALS CH
1.0000 | ORAL_TABLET | Freq: Every day | ORAL | Status: DC
  Administered 2015-04-01 – 2015-04-03 (×3): 1 via ORAL
  Filled 2015-04-01 (×3): qty 1

## 2015-04-01 MED ORDER — ADULT MULTIVITAMIN LIQUID CH: 5.0000 mL | Freq: Every day | ORAL | Status: DC

## 2015-04-01 MED ORDER — POTASSIUM CHLORIDE CRYS ER 20 MEQ PO TBCR
40.0000 meq | EXTENDED_RELEASE_TABLET | Freq: Three times a day (TID) | ORAL | Status: DC
  Administered 2015-04-01 (×2): 40 meq via ORAL
  Filled 2015-04-01 (×2): qty 2

## 2015-04-01 NOTE — Progress Notes (Signed)
Family Medicine Teaching Service Daily Progress Note Intern Pager: [phone]  Patient name: Linda Macias Medical record number: [id number] Date of birth: 1923-02-26 Age: 81 y.o. Gender: female  Primary Care Provider: Kenn File, MD Consultants: gastroenterology Code Status: DNR  Pt Overview and Major Events to Date:  05/29: Admitted  Assessment and Plan: Linda Macias is a 82 y.o. female presenting with four days of worsening diarrhea, dehydration and weakness. PMH is significant for CVA, HTN, HLD, Chronic Diarrhea, Chronic Hypokalemia, Microcytic Anemia.   # Hypokalemia - Pt. Here with predominantly lower extremity weakness with attempts to stand. Hypokalemia to 2.4 by laboratory analysis. Has been on outpatient potassium supplementation, but began having worsening of her diarrhea over the past four days. EKG with T-wave flattening, but otherwise no acute changes, normal QT interval. This is a chronic problem related to her chronic diarrhea. Bicarb is 24>22. K 2.6>3.6 - telemetry - Trend potassium - KDur 40 mEq BID - Will continue repletion as indicated. - Discontinue Chlorthalidone for HTN.  - likely related to GI loss, and needs management of her chronic diarrhea.   # Chronic Diarrhea - Pt. Has had chronic diarrhea since 10/2014. Has had a thorough workup by GI at [facility]. EGD was negative. C-Scope in 07/2014 negative for lesions (no mucosal biopsies taken), RUQ U/S with evidence of ? Gallbladder polyps. CT abdomen benign / normal pancreas. CTA of mesentery normal. PET scan of mesentery normal. Hydrogen Breath Test was markedly elevated and pt. Thought to have severe small bowel bacterial overgrowth. Started on Xifaxin, but insurance has since stopped paying for it. Mcarthur Rossetti has helped her the most of all of the therapies that she has tried.   C. Diff negative - Loperamide home dose - c/s GI, appreciate recs  -Restart Xifaxin, min KCL 80 mEq/d, max KCL 160 mEq/d until K >4 -  Discuss options regarding Xifaxin. Case management consulted.  - D/C flagyl for now.  - Will follow her symptomatically.   # Malnutrition - No weights here, but 9 lb. Weight loss from 12/2014 to 01/2015 documented in GI note at [facility] and pt reports 51 lbs down from Dec. Will f/u her weights here. She has severe temporal, clavicular, and calf wasting. Awaiting albumin for further characterization of her nutritional status. Most likely related to poor GI absorption 2/2 chronic diarrhea.  - Nutrition consult, Ensure supplementation.  - Pending CMP / Albumin / Synthetic function / prealbumin - multivitamin.  - PT/OT - f/u am Mg  # Iron Deficiency Anemia - Hgb 8.6>8.0>8.0.  Downtrended from discharge in January. Has been on Iron supplementation at home, ?noncompliance due to fear of constipation. Fe 21, TIBC 179, ferritin 293, retic 0.9% TSH 1.591, Mg 1.4, Phos 2.0 - s/p IV iron  # HTN - Normotensive. Holding home BP meds for now. Need to discontinue chlorthalidone. Atenolol is her second med.   # HLD - Elevated lipids in 2014. No lipid panel since and pt. Not on statin. FLP normal  FEN/GI: IVF NS 75cc/ hr. / Regular Diet / Nutritional supplementation.  Prophylaxis: Lovenox.   Disposition: Pending repletion of potassium and further workup.   Subjective:  Patient reports that she is doing much better today.  She is ambulating around the room and to the bathroom.  She reports that her stools started to become more formed after the first dose of Xifaxin.  She states that she only moved her bowels twice yesterday.  Denies any nausea/vomiting and is tolerating PO well.  She  does note that she has diarrhea more frequently with Boost and Ensure 2/2 artificial sweeteners and would like to avoid these if possible.  Voices good understanding of plan.  Objective: Temp:  [97.9 F (36.6 C)-98.9 F (37.2 C)] 97.9 F (36.6 C) (05/31 0523) Pulse Rate:  [65-66] 65 (05/31 0523) Resp:  [14-16] 14 (05/31  0523) BP: (115-124)/(58-65) 115/65 mmHg (05/31 0523) SpO2:  [97 %-100 %] 100 % (05/31 0523) Weight:  [104 lb 8 oz (47.4 kg)] 104 lb 8 oz (47.4 kg) (05/31 0523) Physical Exam: General: awake, alert, thin, NAD, daughter at bedside Cardiovascular: RRR, NSR @ 64BPM on tele, no murmurs, +1 radial pulses Respiratory: CTAB, no increased WOB Abdomen: flat, soft, NT/ND, +BS Extremities: thin, warm Neuro: AOx3, follows commands, speech normal  Laboratory:  Recent Labs Lab 03/30/15 1913 03/31/15 0540 04/01/15 0434  WBC 6.3 5.8 5.6  HGB 8.6* 8.0* 8.0*  HCT 25.8* 24.1* 24.0*  PLT 262 274 283    Recent Labs Lab 03/30/15 1913 03/31/15 0540 03/31/15 1605 04/01/15 0434  NA 138 137 134* 138  K 2.8* 2.6* 3.2* 3.6  CL 106 108 107 108  CO2 23 24 23 22   BUN 18 13 10 7   CREATININE 0.69 0.64 0.70 0.59  CALCIUM 8.4* 8.0* 7.8* 8.3*  PROT 5.5*  --   --   --   BILITOT 0.4  --   --   --   ALKPHOS 56  --   --   --   ALT 8*  --   --   --   AST 12*  --   --   --   GLUCOSE 88 70 171* 78   Mg 1.4>2.0 Prealbumin 8.1 Albumin 2.2 Phos 2.0>1.4  Iron/TIBC/Ferritin/ %Sat    Component Value Date/Time   IRON 21* 03/31/2015 0540   TIBC 179* 03/31/2015 0540   FERRITIN 293 03/31/2015 0540   IRONPCTSAT 12 03/31/2015 0540  Retic: 3.43  Imaging/Diagnostic Tests: none  Janora Norlander, DO 04/01/2015, 7:13 AM PGY-1, [place] Intern pager: [phone], text pages welcome

## 2015-04-01 NOTE — Progress Notes (Signed)
[facility] Progress Note  Subjective: Pt feeling ok, perhaps slight improvement in diarrhea  Objective: Vital signs in last 24 hours: Temp:  [97.9 F (36.6 C)-98.9 F (37.2 C)] 97.9 F (36.6 C) (05/31 0523) Pulse Rate:  [65-66] 65 (05/31 0523) Resp:  [14-16] 14 (05/31 0523) BP: (115-124)/(58-65) 115/65 mmHg (05/31 0523) SpO2:  [97 %-100 %] 100 % (05/31 0523) Weight:  [47.4 kg (104 lb 8 oz)] 47.4 kg (104 lb 8 oz) (05/31 0523) Weight change: -0.4 kg (-14.1 oz)   PE:pt on bedside commode, detailed exam not done  Lab Results: Results for orders placed or performed during the hospital encounter of 03/30/15 (from the past 24 hour(s))  Urinalysis, Routine w reflex microscopic     Status: Abnormal   Collection Time: 03/31/15 10:13 AM  Result Value Ref Range   Color, Urine AMBER (A) YELLOW   APPearance CLOUDY (A) CLEAR   Specific Gravity, Urine 1.015 1.005 - 1.030   pH 5.0 5.0 - 8.0   Glucose, UA NEGATIVE NEGATIVE mg/dL   Hgb urine dipstick NEGATIVE NEGATIVE   Bilirubin Urine NEGATIVE NEGATIVE   Ketones, ur NEGATIVE NEGATIVE mg/dL   Protein, ur NEGATIVE NEGATIVE mg/dL   Urobilinogen, UA 0.2 0.0 - 1.0 mg/dL   Nitrite POSITIVE (A) NEGATIVE   Leukocytes, UA TRACE (A) NEGATIVE  Urine microscopic-add on     Status: Abnormal   Collection Time: 03/31/15 10:13 AM  Result Value Ref Range   Squamous Epithelial / LPF RARE RARE   WBC, UA 7-10 <3 WBC/hpf   RBC / HPF 3-6 <3 RBC/hpf   Bacteria, UA MANY (A) RARE   Casts HYALINE CASTS (A) NEGATIVE  Clostridium Difficile by PCR     Status: None   Collection Time: 03/31/15  3:12 PM  Result Value Ref Range   C difficile by pcr NEGATIVE NEGATIVE  Basic metabolic panel     Status: Abnormal   Collection Time: 03/31/15  4:05 PM  Result Value Ref Range   Sodium 134 (L) 135 - 145 mmol/L   Potassium 3.2 (L) 3.5 - 5.1 mmol/L   Chloride 107 101 - 111 mmol/L   CO2 23 22 - 32 mmol/L   Glucose, Bld 171 (H) 65 - 99 mg/dL   BUN 10 6 - 20  mg/dL   Creatinine, Ser 0.70 0.44 - 1.00 mg/dL   Calcium 7.8 (L) 8.9 - 10.3 mg/dL   GFR calc non Af Amer >60 >60 mL/min   GFR calc Af Amer >60 >60 mL/min   Anion gap 4 (L) 5 - 15  Renal function panel     Status: Abnormal   Collection Time: 04/01/15  4:34 AM  Result Value Ref Range   Sodium 138 135 - 145 mmol/L   Potassium 3.6 3.5 - 5.1 mmol/L   Chloride 108 101 - 111 mmol/L   CO2 22 22 - 32 mmol/L   Glucose, Bld 78 65 - 99 mg/dL   BUN 7 6 - 20 mg/dL   Creatinine, Ser 0.59 0.44 - 1.00 mg/dL   Calcium 8.3 (L) 8.9 - 10.3 mg/dL   Phosphorus 1.4 (L) 2.5 - 4.6 mg/dL   Albumin 2.2 (L) 3.5 - 5.0 g/dL   GFR calc non Af Amer >60 >60 mL/min   GFR calc Af Amer >60 >60 mL/min   Anion gap 8 5 - 15  CBC     Status: Abnormal   Collection Time: 04/01/15  4:34 AM  Result Value Ref Range   WBC 5.6 4.0 -  10.5 K/uL   RBC 3.43 (L) 3.87 - 5.11 MIL/uL   Hemoglobin 8.0 (L) 12.0 - 15.0 g/dL   HCT 24.0 (L) 36.0 - 46.0 %   MCV 70.0 (L) 78.0 - 100.0 fL   MCH 23.3 (L) 26.0 - 34.0 pg   MCHC 33.3 30.0 - 36.0 g/dL   RDW 15.0 11.5 - 15.5 %   Platelets 283 150 - 400 K/uL    Studies/Results: No results found.    Assessment: Recurrent diarrhea presumed secondary to small bowel bacterial overgrowth. C. difficile toxin and CT abdomen unrevealing. Has had good results in the past with rifaximin  Plan: Continue rifaximin Continue aggressive replenishment of potassium. Dr. Cristina Gong to inquire with rep whether compassionate use program for rifaximin available.   Zellie Jenning C 04/01/2015, 8:28 AM  Pager [phone] If no answer or after 5 PM call [phone]

## 2015-04-01 NOTE — Progress Notes (Signed)
Brief Follow-Up Note  Received paged from RN who reports that pt is refusing Prostat supplement, due it giving her diarrhea and stomach pain. Pt also refuses Boost, Ensure, and Resource Breeze for same reasons.   Prostat d/c due to refusal. Will continue to monitor pt.   Linda Macias A. Jimmye Norman, RD, LDN, CDE Pager: [phone] After hours Pager: [phone]

## 2015-04-01 NOTE — Progress Notes (Signed)
Occupational Therapy Evaluation Patient Details Name: Linda Macias MRN: [id number] DOB: November 17, 1922 Today's Date: 04/01/2015    History of Present Illness Pt adm with hypokalemia and weakness due to chronic diarrhea.    Clinical Impression   Patient independent with mobility and required min assist with ADLs PTA, pt reports her daughter and granddaughter assisted with ADLs. Patient currently functioning at an overall supervision for mobility using RW. Patient will benefit from acute OT to increase overall independence in the areas of functional mobility, education on energy conservation techniques, and overall safety in order to safely discharge home with intermittent supervision.      Follow Up Recommendations  No OT follow up;Supervision - Intermittent    Equipment Recommendations  None recommended by OT    Recommendations for Other Services  None at this time    Precautions / Restrictions Precautions Precautions: Fall Restrictions Weight Bearing Restrictions: No      Mobility Bed Mobility Overal bed mobility: Modified Independent  Transfers Overall transfer level: Needs assistance Equipment used: Rolling walker (2 wheeled) Transfers: Sit to/from Stand Sit to Stand: Supervision General transfer comment: Cues for hand placement and safety    Balance Overall balance assessment: Needs assistance Sitting-balance support: No upper extremity supported;Feet supported Sitting balance-Leahy Scale: Normal     Standing balance support: Bilateral upper extremity supported;During functional activity Standing balance-Leahy Scale: Fair    ADL Overall ADL's : Needs assistance/impaired General ADL Comments: Pt required assistance with ADLs prior to admission. Pt was ambulating without use of RW PTA and currently requires supervision with RW. No ADL goals set at this time, did set energy conservation and functional mobility goal for safety at home. Pt states her daughter or  granddaughter will be present post acute d/c     Pertinent Vitals/Pain Pain Assessment: No/denies pain     Hand Dominance Right   Extremity/Trunk Assessment Upper Extremity Assessment Upper Extremity Assessment: Overall WFL for tasks assessed   Lower Extremity Assessment Lower Extremity Assessment: Defer to PT evaluation   Cervical / Trunk Assessment Cervical / Trunk Assessment: Normal   Communication Communication Communication: No difficulties   Cognition Arousal/Alertness: Awake/alert Behavior During Therapy: WFL for tasks assessed/performed Overall Cognitive Status: Within Functional Limits for tasks assessed              Home Living Family/patient expects to be discharged to:: Private residence Living Arrangements: Children (granddaughter or daughter) Available Help at Discharge: Family;Available PRN/intermittently Type of Home: House Home Access: Level entry     Home Layout: Two level;Able to live on main level with bedroom/bathroom     Bathroom Shower/Tub: Tub/shower unit;Curtain   Bathroom Toilet: Standard     Home Equipment: Bedside commode;Hospital bed;Cane - single point;Walker - 2 wheels;Shower seat   Prior Functioning/Environment Level of Independence: Needs assistance    ADL's / Homemaking Assistance Needed: Pt reports she does not shower without daughter present. Per pt report, her daughter helps with bathing.    OT Diagnosis: Generalized weakness   OT Problem List: Decreased strength;Decreased activity tolerance;Impaired balance (sitting and/or standing);Decreased safety awareness;Decreased knowledge of use of DME or AE   OT Treatment/Interventions: Self-care/ADL training;Therapeutic exercise;Energy conservation;DME and/or AE instruction;Therapeutic activities;Patient/family education;Balance training    OT Goals(Current goals can be found in the care plan section) Acute Rehab OT Goals Patient Stated Goal: Go home OT Goal Formulation: With  patient Time For Goal Achievement: 04/08/15 Potential to Achieve Goals: Good ADL Goals Additional ADL Goal #2: Patient will be educated on energy conservation  techniques and pt will be able to verbalize at least 3 energy conservation techniques independently  OT Frequency: Min 2X/week   Barriers to D/C: None known at this time   End of Session Equipment Utilized During Treatment: Rolling walker  Activity Tolerance: Patient tolerated treatment well Patient left: in bed;with call bell/phone within reach (seated EOB)   Time: 3536-1443 OT Time Calculation (min): 20 min Charges:  OT General Charges $OT Visit: 1 Procedure OT Evaluation $Initial OT Evaluation Tier I: 1 Procedure  Lavine Hargrove , MS, OTR/L, CLT Pager: [phone]  04/01/2015, 1:33 PM

## 2015-04-01 NOTE — Progress Notes (Signed)
Patient refusing to have any ensure, boost, and pro-stat, stating it hurts her stomach and causes her to have diarrhea and stomach pain. She further states she is ok to eat whole food and have water or liquid with the food, but cannot tolerate the liquid drinks listed above (Registered Dietician [employer] paged and aware).

## 2015-04-02 DIAGNOSIS — R197 Diarrhea, unspecified: Secondary | ICD-10-CM

## 2015-04-02 LAB — RENAL FUNCTION PANEL
Albumin: 2.2 g/dL — ABNORMAL LOW (ref 3.5–5.0)
Anion gap: 6 (ref 5–15)
BUN: <5 mg/dL — ABNORMAL LOW (ref 6–20)
CO2: 22 mmol/L (ref 22–32)
Calcium: 8.5 mg/dL — ABNORMAL LOW (ref 8.9–10.3)
Chloride: 110 mmol/L (ref 101–111)
Creatinine, Ser: 0.61 mg/dL (ref 0.44–1.00)
GFR calc Af Amer: >60 mL/min (ref >60)
GFR calc non Af Amer: >60 mL/min (ref >60)
Glucose, Bld: 71 mg/dL (ref 65–99)
Phosphorus: 1.2 mg/dL — ABNORMAL LOW (ref 2.5–4.6)
Potassium: 4.9 mmol/L (ref 3.5–5.1)
Sodium: 138 mmol/L (ref 135–145)

## 2015-04-02 LAB — MAGNESIUM: Magnesium: 1.8 mg/dL (ref 1.7–2.4)

## 2015-04-02 MED ORDER — POTASSIUM CHLORIDE CRYS ER 20 MEQ PO TBCR: 20.0000 meq | EXTENDED_RELEASE_TABLET | Freq: Two times a day (BID) | ORAL | Status: DC

## 2015-04-02 MED ORDER — FLEET ENEMA 7-19 GM/118ML RE ENEM: 1.0000 | ENEMA | Freq: Once | RECTAL | Status: DC

## 2015-04-02 MED ORDER — RIFAXIMIN 550 MG PO TABS: 550.0000 mg | ORAL_TABLET | Freq: Three times a day (TID) | ORAL | Status: DC

## 2015-04-02 MED ORDER — POTASSIUM CHLORIDE CRYS ER 20 MEQ PO TBCR: 40.0000 meq | EXTENDED_RELEASE_TABLET | Freq: Two times a day (BID) | ORAL | Status: DC

## 2015-04-02 MED ORDER — DEXTROSE 5 % IV SOLN
20.0000 mmol | Freq: Once | INTRAVENOUS | Status: AC
  Administered 2015-04-02: 20 mmol via INTRAVENOUS
  Filled 2015-04-02 (×4): qty 6.67

## 2015-04-02 NOTE — Progress Notes (Signed)
PCP note  Ms. Linda Macias is a very pleasant 81 y/o female with chronic diarrhea now known to be caused by SIBO. She is admitted after stopping rifaximin OP and having a return of her diarhea. She is feeling better and in good spirits. She is requesting a rolling walker with a chair. I agree with and appreciate the excellent care being provided by the [facility] and [facility].   Laroy Apple, MD [place] Resident, PGY-3 04/02/2015, 8:41 AM

## 2015-04-02 NOTE — Progress Notes (Signed)
Linda Macias with [facility] notified of DME order and psooble DC later today. Patient would also like [facility] for any HH needs

## 2015-04-02 NOTE — Progress Notes (Signed)
Physical Therapy Treatment Patient Details Name: Linda Macias MRN: [id number] DOB: 06-20-1923 Today's Date: 04/02/2015    History of Present Illness Pt adm with hypokalemia and weakness due to chronic diarrhea.     PT Comments    Patient ambulating farther, gait is more steady. Recommend  HHPT  Follow Up Recommendations  Home health PT;Supervision/Assistance - 24 hour     Equipment Recommendations  None recommended by PT    Recommendations for Other Services       Precautions / Restrictions Precautions Precautions: Fall    Mobility  Bed Mobility                  Transfers   Equipment used: Rolling walker (2 wheeled) Transfers: Sit to/from Stand Sit to Stand: Supervision         General transfer comment: Cues for hand placement and safety  Ambulation/Gait Ambulation/Gait assistance: Supervision Ambulation Distance (Feet): 300 Feet Assistive device: Rolling walker (2 wheeled)       General Gait Details: Assist for safety and stability   Stairs            Wheelchair Mobility    Modified Rankin (Stroke Patients Only)       Balance           Standing balance support: Bilateral upper extremity supported;During functional activity Standing balance-Leahy Scale: Fair                      Cognition Arousal/Alertness: Awake/alert Behavior During Therapy: WFL for tasks assessed/performed Overall Cognitive Status: Within Functional Limits for tasks assessed                      Exercises      General Comments        Pertinent Vitals/Pain Pain Assessment: No/denies pain    Home Living                      Prior Function            PT Goals (current goals can now be found in the care plan section) Progress towards PT goals: Progressing toward goals    Frequency  Min 3X/week    PT Plan Discharge plan needs to be updated;Current plan remains appropriate    Co-evaluation             End  of Session   Activity Tolerance: Patient tolerated treatment well Patient left: in bed;with call bell/phone within reach;with family/visitor present     Time: 6568-1275 PT Time Calculation (min) (ACUTE ONLY): 26 min  Charges:  $Gait Training: 23-37 mins                    G Codes:      Claretha Cooper 04/02/2015, 1:14 PM Tresa Endo PT [phone]

## 2015-04-02 NOTE — Progress Notes (Signed)
Miranda with [facility] notified of [facility] PT order and probable discharge tomorrow.

## 2015-04-02 NOTE — Progress Notes (Signed)
[facility] Progress Note  Subjective: Feels much better today, diarrhea much improved. Tolerating solid food  Objective: Vital signs in last 24 hours: Temp:  [98 F (36.7 C)-99.2 F (37.3 C)] 98 F (36.7 C) (06/01 0515) Pulse Rate:  [67-109] 67 (06/01 0515) Resp:  [15-16] 16 (06/01 0515) BP: (107-119)/(54-72) 107/54 mmHg (06/01 0515) SpO2:  [93 %-100 %] 100 % (06/01 0515) Weight:  [47.7 kg (105 lb 2.6 oz)] 47.7 kg (105 lb 2.6 oz) (06/01 0515) Weight change: 0.3 kg (10.6 oz)   PE:abd soft, nontender   Lab Results: Results for orders placed or performed during the hospital encounter of 03/30/15 (from the past 24 hour(s))  Renal function panel     Status: Abnormal   Collection Time: 04/02/15  7:10 AM  Result Value Ref Range   Sodium 138 135 - 145 mmol/L   Potassium 4.9 3.5 - 5.1 mmol/L   Chloride 110 101 - 111 mmol/L   CO2 22 22 - 32 mmol/L   Glucose, Bld 71 65 - 99 mg/dL   BUN <5 (L) 6 - 20 mg/dL   Creatinine, Ser 0.61 0.44 - 1.00 mg/dL   Calcium 8.5 (L) 8.9 - 10.3 mg/dL   Phosphorus 1.2 (L) 2.5 - 4.6 mg/dL   Albumin 2.2 (L) 3.5 - 5.0 g/dL   GFR calc non Af Amer >60 >60 mL/min   GFR calc Af Amer >60 >60 mL/min   Anion gap 6 5 - 15  Magnesium     Status: None   Collection Time: 04/02/15  7:10 AM  Result Value Ref Range   Magnesium 1.8 1.7 - 2.4 mg/dL    Studies/Results: No results found.    Assessment: Diarrhea presumed secondary to SBBO, Much improved on rifaxamin  Plan:  Cont current meds, poss home tomorrow, will check on status of obtaining xifaxin post dc    Jehan Bonano C 04/02/2015, 9:35 AM  Pager [phone] If no answer or after 5 PM call [phone]

## 2015-04-02 NOTE — Progress Notes (Signed)
Family Medicine Teaching Service Daily Progress Note Intern Pager: [phone]  Patient name: Linda Macias Medical record number: [id number] Date of birth: 1922/12/02 Age: 81 y.o. Gender: female  Primary Care Provider: Kenn File, MD Consultants: gastroenterology Code Status: DNR  Pt Overview and Major Events to Date:  05/29: Admitted  Assessment and Plan: Linda Macias is a 81 y.o. female presenting with four days of worsening diarrhea, dehydration and weakness. PMH is significant for CVA, HTN, HLD, Chronic Diarrhea, Chronic Hypokalemia, Microcytic Anemia.   # Hypokalemia - Pt. Here with predominantly lower extremity weakness with attempts to stand. Hypokalemia to 2.4 by laboratory analysis. Has been on outpatient potassium supplementation, but began having worsening of her diarrhea over the past four days. EKG with T-wave flattening, but otherwise no acute changes, normal QT interval. This is a chronic problem related to her chronic diarrhea. Bicarb is 24>22. K 2.6>3.6>4.9 - telemetry - Trend potassium - Will discharge on Kdur 64mEq daily if able to secure Xifaxin - Will continue repletion as indicated. - Discontinue Chlorthalidone for HTN.  - likely related to GI loss, and needs management of her chronic diarrhea.   # Chronic Diarrhea - Pt. Has had chronic diarrhea since 10/2014. Has had a thorough workup by GI at [facility]. EGD was negative. C-Scope in 07/2014 negative for lesions (no mucosal biopsies taken), RUQ U/S with evidence of ? Gallbladder polyps. CT abdomen benign / normal pancreas. CTA of mesentery normal. PET scan of mesentery normal. Hydrogen Breath Test was markedly elevated and pt. Thought to have severe small bowel bacterial overgrowth. Started on Xifaxin, but insurance has since stopped paying for it. Mcarthur Rossetti has helped her the most of all of the therapies that she has tried.   C. Diff negative - Loperamide home dose - c/s GI, appreciate recs  -Restart Xifaxin,  min KCL 80 mEq/d, max KCL 160 mEq/d until K >4  -LVM with Xifaxin rep Darrick Huntsman [phone] to see if we can get a compassion fill for patient.  Care Management also aware and working on this for patient. - Discuss options regarding Xifaxin. Case management consulted.  - D/C flagyl for now.  - Will follow her symptomatically.   # Malnutrition - No weights here, but 9 lb. Weight loss from 12/2014 to 01/2015 documented in GI note at [facility] and pt reports 51 lbs down from Dec. Will f/u her weights here. She has severe temporal, clavicular, and calf wasting. Awaiting albumin for further characterization of her nutritional status. Most likely related to poor GI absorption 2/2 chronic diarrhea.  - Nutrition consult, Ensure supplementation.  - Pending CMP / Albumin / Synthetic function / prealbumin - multivitamin.  - PT/OT - f/u am Mg  # Iron Deficiency Anemia - Hgb 8.6>8.0>8.0.  Downtrended from discharge in January. Has been on Iron supplementation at home, ?noncompliance due to fear of constipation. Fe 21, TIBC 179, ferritin 293, retic 0.9% TSH 1.591, Mg 1.4, Phos 2.0>1.2.  Patient asx - s/p IV iron  # HTN - Normotensive. Holding home BP meds for now. Need to discontinue chlorthalidone. Atenolol is her second med.   # HLD - Elevated lipids in 2014. No lipid panel since and pt. Not on statin. FLP normal  FEN/GI: IVF NS 75cc/ hr. / Regular Diet / Nutritional supplementation.  Prophylaxis: Lovenox.   Disposition: Pending repletion of potassium and further workup.   Subjective:  Patient reports that she is feeling well this am.  She denies any abdominal pain, nausea, vomiting, dizziness.  She reports that stools continued to be more formed like a "cow patty".  She reports eating and ambulating well.  Denies any concerns at this time.  Objective: Temp:  [98 F (36.7 C)-99.2 F (37.3 C)] 98 F (36.7 C) (06/01 0515) Pulse Rate:  [67-109] 67 (06/01 0515) Resp:  [15-16] 16 (06/01 0515) BP:  (107-119)/(54-72) 107/54 mmHg (06/01 0515) SpO2:  [93 %-100 %] 100 % (06/01 0515) Weight:  [105 lb 2.6 oz (47.7 kg)] 105 lb 2.6 oz (47.7 kg) (06/01 0515) Physical Exam: General: awake, alert, thin, NAD, daughter at bedside Cardiovascular: RRR, NSR 65 on tele, no murmurs, +1 radial pulses Respiratory: CTAB, no increased WOB Abdomen: flat, soft, NT/ND, +BS Extremities: thin, warm Neuro: AOx3, follows commands, speech normal  Laboratory:  Recent Labs Lab 03/30/15 1913 03/31/15 0540 04/01/15 0434  WBC 6.3 5.8 5.6  HGB 8.6* 8.0* 8.0*  HCT 25.8* 24.1* 24.0*  PLT 262 274 283    Recent Labs Lab 03/30/15 1913  03/31/15 1605 04/01/15 0434 04/02/15 0710  NA 138  < > 134* 138 138  K 2.8*  < > 3.2* 3.6 4.9  CL 106  < > 107 108 110  CO2 23  < > 23 22 22   BUN 18  < > 10 7 <5*  CREATININE 0.69  < > 0.70 0.59 0.61  CALCIUM 8.4*  < > 7.8* 8.3* 8.5*  PROT 5.5*  --   --   --   --   BILITOT 0.4  --   --   --   --   ALKPHOS 56  --   --   --   --   ALT 8*  --   --   --   --   AST 12*  --   --   --   --   GLUCOSE 88  < > 171* 78 71  < > = values in this interval not displayed. Mg 1.4>2.0 Prealbumin 8.1 Albumin 2.2 Phos 2.0>1.4  Iron/TIBC/Ferritin/ %Sat    Component Value Date/Time   IRON 21* 03/31/2015 0540   TIBC 179* 03/31/2015 0540   FERRITIN 293 03/31/2015 0540   IRONPCTSAT 12 03/31/2015 0540  Retic: 3.43  Imaging/Diagnostic Tests: none  Janora Norlander, DO 04/02/2015, 9:36 AM PGY-1, [place] Intern pager: [phone], text pages welcome

## 2015-04-02 NOTE — Progress Notes (Signed)
Per rep at [employer], $3.60, Josem Kaufmann is required, was submitted but Denied- doctor needs to call to see what requirements are to get it covered [phone]  Dr. Brita Romp paged and updated, phone number given to call to get authorization.

## 2015-04-03 ENCOUNTER — Telehealth: Payer: Self-pay | Admitting: Family Medicine

## 2015-04-03 LAB — RENAL FUNCTION PANEL
Albumin: 2.2 g/dL — ABNORMAL LOW (ref 3.5–5.0)
Anion gap: 6 (ref 5–15)
BUN: <5 mg/dL — ABNORMAL LOW (ref 6–20)
CO2: 23 mmol/L (ref 22–32)
Calcium: 8.3 mg/dL — ABNORMAL LOW (ref 8.9–10.3)
Chloride: 107 mmol/L (ref 101–111)
Creatinine, Ser: 0.57 mg/dL (ref 0.44–1.00)
GFR calc Af Amer: >60 mL/min (ref >60)
GFR calc non Af Amer: >60 mL/min (ref >60)
Glucose, Bld: 71 mg/dL (ref 65–99)
Phosphorus: 2.5 mg/dL (ref 2.5–4.6)
Potassium: 4.2 mmol/L (ref 3.5–5.1)
Sodium: 136 mmol/L (ref 135–145)

## 2015-04-03 LAB — CBC
HCT: 24.4 % — ABNORMAL LOW (ref 36.0–46.0)
Hemoglobin: 7.9 g/dL — ABNORMAL LOW (ref 12.0–15.0)
MCH: 23.3 pg — ABNORMAL LOW (ref 26.0–34.0)
MCHC: 32.4 g/dL (ref 30.0–36.0)
MCV: 72 fL — ABNORMAL LOW (ref 78.0–100.0)
Platelets: 302 10*3/uL (ref 150–400)
RBC: 3.39 MIL/uL — ABNORMAL LOW (ref 3.87–5.11)
RDW: 15.6 % — ABNORMAL HIGH (ref 11.5–15.5)
WBC: 4.6 10*3/uL (ref 4.0–10.5)

## 2015-04-03 NOTE — Progress Notes (Signed)
[facility] Progress Note  Subjective: Feels great, no diarrhea, tolerating diet  Objective: Vital signs in last 24 hours: Temp:  [97.9 F (36.6 C)-98.4 F (36.9 C)] 97.9 F (36.6 C) (06/02 3244) Pulse Rate:  [62-77] 62 (06/02 0613) Resp:  [18-20] 18 (06/02 0613) BP: (113-116)/(56-66) 113/64 mmHg (06/02 0613) SpO2:  [100 %] 100 % (06/02 0102) Weight:  [48 kg (105 lb 13.1 oz)] 48 kg (105 lb 13.1 oz) (06/02 7253) Weight change: 0.3 kg (10.6 oz)   PE: Unchanged  Lab Results: Results for orders placed or performed during the hospital encounter of 03/30/15 (from the past 24 hour(s))  Renal function panel     Status: Abnormal   Collection Time: 04/03/15  6:45 AM  Result Value Ref Range   Sodium 136 135 - 145 mmol/L   Potassium 4.2 3.5 - 5.1 mmol/L   Chloride 107 101 - 111 mmol/L   CO2 23 22 - 32 mmol/L   Glucose, Bld 71 65 - 99 mg/dL   BUN <5 (L) 6 - 20 mg/dL   Creatinine, Ser 0.57 0.44 - 1.00 mg/dL   Calcium 8.3 (L) 8.9 - 10.3 mg/dL   Phosphorus 2.5 2.5 - 4.6 mg/dL   Albumin 2.2 (L) 3.5 - 5.0 g/dL   GFR calc non Af Amer >60 >60 mL/min   GFR calc Af Amer >60 >60 mL/min   Anion gap 6 5 - 15  CBC     Status: Abnormal   Collection Time: 04/03/15  6:45 AM  Result Value Ref Range   WBC 4.6 4.0 - 10.5 K/uL   RBC 3.39 (L) 3.87 - 5.11 MIL/uL   Hemoglobin 7.9 (L) 12.0 - 15.0 g/dL   HCT 24.4 (L) 36.0 - 46.0 %   MCV 72.0 (L) 78.0 - 100.0 fL   MCH 23.3 (L) 26.0 - 34.0 pg   MCHC 32.4 30.0 - 36.0 g/dL   RDW 15.6 (H) 11.5 - 15.5 %   Platelets 302 150 - 400 K/uL    Studies/Results: No results found.    Assessment: Diarrhea secondary small bowel bacterial overgrowth, very responsive to xifaxin Plan: Probably okay to discharge today, hopefully we will be able to secure continued xifaxin. Follow-up in 3-4 weeks with Dr. Cristina Gong. Call us if further input needed this hospitalization.    Lonnie Rosado C 04/03/2015, 8:35 AM  Pager [phone] If no answer or after 5 PM  call [phone]

## 2015-04-03 NOTE — Telephone Encounter (Signed)
[employer]: mfg of Ziflaxin Received the expidated application. The voucher was sent to [facility] yesterday. It is for 7 days -14 pills. Needs clarification on diagnosis code What is the primary diagnosis code. None of the the codes given pertain to medication  Typically med is used for AT (?) Please call camille

## 2015-04-03 NOTE — Telephone Encounter (Signed)
Left message for Linda Macias that Dx Code was K63.89 (intestinal bacterial overgrowth) and to call back with any questions.

## 2015-04-03 NOTE — Progress Notes (Signed)
Awaiting call back from Dr Alinda Deem reguarding coverage of Xifaxan. No coverage or assistance available from known resources at this  Time, as patient has insurance (not eligible for [facility]) however it is Medicare (disqualifies for coupons/ plans). Will continue to explore other options if available if medication not authorized per Dr Reginia Forts, however any assistance unlikely.

## 2015-04-03 NOTE — Progress Notes (Signed)
Family Medicine Teaching Service Daily Progress Note Intern Pager: [phone]  Patient name: Linda Macias Medical record number: [id number] Date of birth: 01/10/1923 Age: 81 y.o. Gender: female  Primary Care Provider: Kenn File, MD Consultants: gastroenterology Code Status: DNR  Pt Overview and Major Events to Date:  05/29: Admitted  Assessment and Plan: Linda Macias is a 81 y.o. female presenting with four days of worsening diarrhea, dehydration and weakness. PMH is significant for CVA, HTN, HLD, Chronic Diarrhea, Chronic Hypokalemia, Microcytic Anemia.   # Hypokalemia - Pt. Here with predominantly lower extremity weakness with attempts to stand. Hypokalemia to 2.4 by laboratory analysis. Has been on outpatient potassium supplementation, but began having worsening of her diarrhea over the past four days. EKG with T-wave flattening, but otherwise no acute changes, normal QT interval. This is a chronic problem related to her chronic diarrhea. Bicarb is 24>22. K 2.6>3.6>4.9>4.2, Phos 2.5 - telemetry - Trend potassium - Will discharge on Kdur 7mEq daily if able to secure Xifaxin - Will continue repletion as indicated. - Discontinue Chlorthalidone for HTN.  - likely related to GI loss, and needs management of her chronic diarrhea.   # Chronic Diarrhea - Pt. Has had chronic diarrhea since 10/2014. Has had a thorough workup by GI at [facility]. EGD was negative. C-Scope in 07/2014 negative for lesions (no mucosal biopsies taken), RUQ U/S with evidence of ? Gallbladder polyps. CT abdomen benign / normal pancreas. CTA of mesentery normal. PET scan of mesentery normal. Hydrogen Breath Test was markedly elevated and pt. Thought to have severe small bowel bacterial overgrowth. Started on Xifaxin, but insurance has since stopped paying for it. Linda Macias has helped her the most of all of the therapies that she has tried.   C. Diff negative - Loperamide home dose - c/s GI, appreciate  recs  -Restart Xifaxin, min KCL 80 mEq/d, max KCL 160 mEq/d until K >4  -Paperwork faxed for prior auth appeal and compassion fill for Xifaxan - Discuss options regarding Xifaxin. Case management consulted.  - Will follow her symptomatically.   # Malnutrition - No weights here, but 9 lb. Weight loss from 12/2014 to 01/2015 documented in GI note at [facility] and pt reports 51 lbs down from Dec. Will f/u her weights here. She has severe temporal, clavicular, and calf wasting. Awaiting albumin for further characterization of her nutritional status. Most likely related to poor GI absorption 2/2 chronic diarrhea. Phos 2.5 s/p IV Na phos - Nutrition consult, Ensure supplementation.  - Pending CMP / Albumin / Synthetic function / prealbumin - multivitamin.  - PT/OT - f/u phos  # Iron Deficiency Anemia - Hgb 8.6>8.0>8.0>7.9.  Downtrended from discharge in January. Has been on Iron supplementation at home, ?noncompliance due to fear of constipation. Fe 21, TIBC 179, ferritin 293, retic 0.9% TSH 1.591 Patient asx - s/p IV iron  # HTN - Normotensive. Holding home BP meds for now. Need to discontinue chlorthalidone. Atenolol is her second med.   # HLD - Elevated lipids in 2014. No lipid panel since and pt. Not on statin. FLP normal  FEN/GI: IVF NS 75cc/ hr. / Regular Diet / Nutritional supplementation.  Prophylaxis: Lovenox.   Disposition: Hopefully home today  Subjective:  Patient reports feeling great.  She reports stools are still doing better.  She is eating very well.  Denies nausea, vomiting, abdominal pain, weakness.  She is ambulating.  She is eager for discharge if possible today.  She also reports that she did not experience  diarrhea until about 4 days after being off of Xifaxan last time.  We discussed efforts to obtain medication at a low to no cost for her.  She voices appreciation.  Objective: Temp:  [97.9 F (36.6 C)-98.4 F (36.9 C)] 97.9 F (36.6 C) (06/02 7517) Pulse Rate:   [62-77] 62 (06/02 0613) Resp:  [18-20] 18 (06/02 0613) BP: (113-116)/(56-66) 113/64 mmHg (06/02 0613) SpO2:  [100 %] 100 % (06/02 0613) Weight:  [105 lb 13.1 oz (48 kg)] 105 lb 13.1 oz (48 kg) (06/02 0017) Physical Exam: General: awake, alert, thin, NAD, daughter at bedside Cardiovascular: RRR, no murmurs, +1 radial pulses Respiratory: CTAB, no increased WOB Abdomen: flat, soft, NT/ND, +BS Extremities: thin, warm Neuro: AOx3, follows commands, speech normal  Laboratory:  Recent Labs Lab 03/31/15 0540 04/01/15 0434 04/03/15 0645  WBC 5.8 5.6 4.6  HGB 8.0* 8.0* 7.9*  HCT 24.1* 24.0* 24.4*  PLT 274 283 302    Recent Labs Lab 03/30/15 1913  04/01/15 0434 04/02/15 0710 04/03/15 0645  NA 138  < > 138 138 136  K 2.8*  < > 3.6 4.9 4.2  CL 106  < > 108 110 107  CO2 23  < > 22 22 23   BUN 18  < > 7 <5* <5*  CREATININE 0.69  < > 0.59 0.61 0.57  CALCIUM 8.4*  < > 8.3* 8.5* 8.3*  PROT 5.5*  --   --   --   --   BILITOT 0.4  --   --   --   --   ALKPHOS 56  --   --   --   --   ALT 8*  --   --   --   --   AST 12*  --   --   --   --   GLUCOSE 88  < > 78 71 71  < > = values in this interval not displayed. Mg 1.4>2.0 Prealbumin 8.1 Albumin 2.2 Phos 2.0>1.4  Iron/TIBC/Ferritin/ %Sat    Component Value Date/Time   IRON 21* 03/31/2015 0540   TIBC 179* 03/31/2015 0540   FERRITIN 293 03/31/2015 0540   IRONPCTSAT 12 03/31/2015 0540  Retic: 3.43  Imaging/Diagnostic Tests: none  Linda Norlander, DO 04/03/2015, 9:46 AM PGY-1, [place] Intern pager: [phone], text pages welcome

## 2015-04-03 NOTE — Progress Notes (Signed)
Spoke with daughter Katharine Look, she stated she will stop by Dr Buccini's office on her way back to the hospital to pick up xifaxan samples.

## 2015-04-03 NOTE — Discharge Instructions (Signed)
You were admitted for low potassium secondary to chronic diarrhea.  You potassium was replenished through your IV and with oral tablets during your hospitalization.  You were seen by Dr Cristina Gong, gastroenterologist, who also recommended that you resume Xifaxin.  You will need to continue to take potassium when you go home.  You should follow up with your primary care doctor in 1 week.  This appointment has been made for you.  You have a lab appointment to check your potassium.  You can pick up samples of the Xifaxan from Dr Buccini's front office that should last you until we can get this medication covered for you.  Take Xifaxan TWICE daily at home.  Diarrhea Diarrhea is watery poop (stool). It can make you feel weak, tired, thirsty, or give you a dry mouth (signs of dehydration). Watery poop is a sign of another problem, most often an infection. It often lasts 2-3 days. It can last longer if it is a sign of something serious. Take care of yourself as told by your doctor. HOME CARE   Drink 1 cup (8 ounces) of fluid each time you have watery poop.  Do not drink the following fluids:  Those that contain simple sugars (fructose, glucose, galactose, lactose, sucrose, maltose).  Sports drinks.  Fruit juices.  Whole milk products.  Sodas.  Drinks with caffeine (coffee, tea, soda) or alcohol.  Oral rehydration solution may be used if the doctor says it is okay. You may make your own solution. Follow this recipe:   - teaspoon table salt.   teaspoon baking soda.   teaspoon salt substitute containing potassium chloride.  1 tablespoons sugar.  1 liter (34 ounces) of water.  Avoid the following foods:  High fiber foods, such as raw fruits and vegetables.  Nuts, seeds, and whole grain breads and cereals.   Those that are sweetened with sugar alcohols (xylitol, sorbitol, mannitol).  Try eating the following foods:  Starchy foods, such as rice, toast, pasta, low-sugar cereal, oatmeal,  baked potatoes, crackers, and bagels.  Bananas.  Applesauce.  Eat probiotic-rich foods, such as yogurt and milk products that are fermented.  Wash your hands well after each time you have watery poop.  Only take medicine as told by your doctor.  Take a warm bath to help lessen burning or pain from having watery poop. GET HELP RIGHT AWAY IF:   You cannot drink fluids without throwing up (vomiting).  You keep throwing up.  You have blood in your poop, or your poop looks black and tarry.  You do not pee (urinate) in 6-8 hours, or there is only a small amount of very dark pee.  You have belly (abdominal) pain that gets worse or stays in the same spot (localizes).  You are weak, dizzy, confused, or light-headed.  You have a very bad headache.  Your watery poop gets worse or does not get better.  You have a fever or lasting symptoms for more than 2-3 days.  You have a fever and your symptoms suddenly get worse. MAKE SURE YOU:   Understand these instructions.  Will watch your condition.  Will get help right away if you are not doing well or get worse. Document Released: 04/05/2008 Document Revised: 03/04/2014 Document Reviewed: 06/25/2012 [facility] Patient Information 2015 [place], [place]. This information is not intended to replace advice given to you by your health care provider. Make sure you discuss any questions you have with your health care provider.  Hypokalemia Hypokalemia means that the  amount of potassium in the blood is lower than normal.Potassium is a chemical, called an electrolyte, that helps regulate the amount of fluid in the body. It also stimulates muscle contraction and helps nerves function properly.Most of the body's potassium is inside of cells, and only a very small amount is in the blood. Because the amount in the blood is so small, minor changes can be life-threatening. CAUSES  Antibiotics.  Diarrhea or vomiting.  Using laxatives too much, which  can cause diarrhea.  Chronic kidney disease.  Water pills (diuretics).  Eating disorders (bulimia).  Low magnesium level.  Sweating a lot. SIGNS AND SYMPTOMS  Weakness.  Constipation.  Fatigue.  Muscle cramps.  Mental confusion.  Skipped heartbeats or irregular heartbeat (palpitations).  Tingling or numbness. DIAGNOSIS  Your health care provider can diagnose hypokalemia with blood tests. In addition to checking your potassium level, your health care provider may also check other lab tests. TREATMENT Hypokalemia can be treated with potassium supplements taken by mouth or adjustments in your current medicines. If your potassium level is very low, you may need to get potassium through a vein (IV) and be monitored in the hospital. A diet high in potassium is also helpful. Foods high in potassium are:  Nuts, such as peanuts and pistachios.  Seeds, such as sunflower seeds and pumpkin seeds.  Peas, lentils, and lima beans.  Whole grain and bran cereals and breads.  Fresh fruit and vegetables, such as apricots, avocado, bananas, cantaloupe, kiwi, oranges, tomatoes, asparagus, and potatoes.  Orange and tomato juices.  Red meats.  Fruit yogurt. HOME CARE INSTRUCTIONS  Take all medicines as prescribed by your health care provider.  Maintain a healthy diet by including nutritious food, such as fruits, vegetables, nuts, whole grains, and lean meats.  If you are taking a laxative, be sure to follow the directions on the label. SEEK MEDICAL CARE IF:  Your weakness gets worse.  You feel your heart pounding or racing.  You are vomiting or having diarrhea.  You are diabetic and having trouble keeping your blood glucose in the normal range. SEEK IMMEDIATE MEDICAL CARE IF:  You have chest pain, shortness of breath, or dizziness.  You are vomiting or having diarrhea for more than 2 days.  You faint. MAKE SURE YOU:   Understand these instructions.  Will watch your  condition.  Will get help right away if you are not doing well or get worse. Document Released: 10/18/2005 Document Revised: 08/08/2013 Document Reviewed: 04/20/2013 [facility] Patient Information 2015 [place], [place]. This information is not intended to replace advice given to you by your health care provider. Make sure you discuss any questions you have with your health care provider.

## 2015-04-04 NOTE — Telephone Encounter (Signed)
Company is calling back to let us know this was denied, patient has reached the max amount of treatments (3). They did issue a voucher but said it should not have been sent, patient only should get expedited vouchers if they have Hepatic encephalopathy. They said this could be appealed.

## 2015-04-07 ENCOUNTER — Other Ambulatory Visit: Payer: Medicare Other

## 2015-04-07 DIAGNOSIS — E876 Hypokalemia: Secondary | ICD-10-CM | POA: Diagnosis not present

## 2015-04-07 LAB — BASIC METABOLIC PANEL
BUN: 10 mg/dL (ref 6–23)
CO2: 21 mEq/L (ref 19–32)
Calcium: 8.7 mg/dL (ref 8.4–10.5)
Chloride: 109 mEq/L (ref 96–112)
Creat: 0.57 mg/dL (ref 0.50–1.10)
Glucose, Bld: 82 mg/dL (ref 70–99)
Potassium: 4.1 mEq/L (ref 3.5–5.3)
Sodium: 136 mEq/L (ref 135–145)

## 2015-04-07 NOTE — Telephone Encounter (Signed)
Called and discussed with patient, She has 3 more days samples of xifaxin that she got from GI. She is seeing Gi today. She will all back if she needs more assistance getting meds as I suspect GI, Dr. Cristina Gong, has been working on this as well.   Laroy Apple, MD [place] Resident, PGY-3 04/07/2015, 1:46 PM

## 2015-04-07 NOTE — Progress Notes (Signed)
Bmp done today Linda Macias 

## 2015-04-08 ENCOUNTER — Telehealth: Payer: Self-pay | Admitting: Family Medicine

## 2015-04-08 DIAGNOSIS — E46 Unspecified protein-calorie malnutrition: Secondary | ICD-10-CM | POA: Diagnosis not present

## 2015-04-08 DIAGNOSIS — A049 Bacterial intestinal infection, unspecified: Secondary | ICD-10-CM | POA: Diagnosis not present

## 2015-04-08 NOTE — Telephone Encounter (Signed)
I can.  However, we may need to get in touch with Dr Cristina Gong, who is also working closely with this patient.  He mentioned that this might happen.  I will try and get in contact with him tomorrow.

## 2015-04-08 NOTE — Telephone Encounter (Signed)
[employer]--mfr of Xifaxin Wants to know if Dr Lajuana Ripple is going to appeal the denial A denial letter was faxed here 03-25-15 The instructions on how to appeal are on the denial letter

## 2015-04-09 DIAGNOSIS — M47817 Spondylosis without myelopathy or radiculopathy, lumbosacral region: Secondary | ICD-10-CM | POA: Diagnosis not present

## 2015-04-10 ENCOUNTER — Inpatient Hospital Stay: Payer: Medicare Other | Admitting: Family Medicine

## 2015-04-10 DIAGNOSIS — A049 Bacterial intestinal infection, unspecified: Secondary | ICD-10-CM | POA: Diagnosis not present

## 2015-04-10 DIAGNOSIS — E46 Unspecified protein-calorie malnutrition: Secondary | ICD-10-CM | POA: Diagnosis not present

## 2015-04-10 NOTE — Telephone Encounter (Signed)
I have tried to call Dr. Cristina Gong, he is out on vacation until June 21. I called the patient but did not get an answer. I am willing to write an appeal letter. WIll ask that it be passed to me.   Laroy Apple, MD [place] Resident, PGY-3 04/10/2015, 12:07 PM

## 2015-04-10 NOTE — Telephone Encounter (Signed)
Gathered denial forms and it is unclear how to appeal. Our clinic RN, Elwin Sleight, will work on it. I appreciate her help.   Dr. Lajuana Ripple has already written a compelling letter on the patient's behalf. I will be out on vacation on 6/10 and again 6/14-6/17 so likely Dr. Lajuana Ripple will have to finish the appeal when we have details to complete it.   Laroy Apple, MD [place] Resident, PGY-3 04/10/2015, 2:23 PM

## 2015-04-10 NOTE — Telephone Encounter (Signed)
Yes, check my box.  I also contact Dr Cristina Gong.  Thanks, Sam.

## 2015-04-11 NOTE — Telephone Encounter (Signed)
Appeal forms placed in provider box for completion.  Derl Barrow, RN

## 2015-04-14 ENCOUNTER — Telehealth: Payer: Self-pay | Admitting: Family Medicine

## 2015-04-14 DIAGNOSIS — A049 Bacterial intestinal infection, unspecified: Secondary | ICD-10-CM | POA: Diagnosis not present

## 2015-04-14 DIAGNOSIS — E46 Unspecified protein-calorie malnutrition: Secondary | ICD-10-CM | POA: Diagnosis not present

## 2015-04-14 NOTE — Telephone Encounter (Signed)
[name] from [employer] called and has some questions about they patients treatment plan and why the doctor prescribed a medication. Please call [phone] and ext [phone] and ask for [place] . jw

## 2015-04-14 NOTE — Telephone Encounter (Signed)
Signed and filled out expedited appeal form. Called sandy back and left VM.   Pt has had a 46 lb weight loss over the last year due to severe diarrhea now felt to be due to Small intestine bacterial overgrowrth as diagnosed at [facility]. She had improvement on rifaximin and then return of symptoms when insurance wouldn't pay for meds (tried flagyl then) and got hospitalized again. We are trying to appeal there denial of rifaximin  Dr. Lajuana Ripple is helping me with this as well.   Linda Apple, MD [place] Resident, PGY-3 04/14/2015, 4:07 PM

## 2015-04-15 ENCOUNTER — Telehealth: Payer: Self-pay | Admitting: Family Medicine

## 2015-04-17 DIAGNOSIS — E46 Unspecified protein-calorie malnutrition: Secondary | ICD-10-CM | POA: Diagnosis not present

## 2015-04-17 DIAGNOSIS — A049 Bacterial intestinal infection, unspecified: Secondary | ICD-10-CM | POA: Diagnosis not present

## 2015-04-21 DIAGNOSIS — E46 Unspecified protein-calorie malnutrition: Secondary | ICD-10-CM | POA: Diagnosis not present

## 2015-04-21 DIAGNOSIS — A049 Bacterial intestinal infection, unspecified: Secondary | ICD-10-CM | POA: Diagnosis not present

## 2015-04-24 ENCOUNTER — Encounter: Payer: Self-pay | Admitting: Family Medicine

## 2015-04-24 ENCOUNTER — Ambulatory Visit (INDEPENDENT_AMBULATORY_CARE_PROVIDER_SITE_OTHER): Payer: Medicare Other | Admitting: Family Medicine

## 2015-04-24 VITALS — BP 120/59 | HR 68 | Temp 98.1°F | Ht 60.0 in | Wt 99.0 lb

## 2015-04-24 DIAGNOSIS — K6389 Other specified diseases of intestine: Secondary | ICD-10-CM

## 2015-04-24 DIAGNOSIS — I10 Essential (primary) hypertension: Secondary | ICD-10-CM | POA: Diagnosis not present

## 2015-04-24 DIAGNOSIS — A049 Bacterial intestinal infection, unspecified: Secondary | ICD-10-CM | POA: Diagnosis not present

## 2015-04-24 DIAGNOSIS — E46 Unspecified protein-calorie malnutrition: Secondary | ICD-10-CM | POA: Diagnosis not present

## 2015-04-24 LAB — COMPREHENSIVE METABOLIC PANEL
ALT: 24 U/L (ref 0–35)
AST: 25 U/L (ref 0–37)
Albumin: 3.5 g/dL (ref 3.5–5.2)
Alkaline Phosphatase: 67 U/L (ref 39–117)
BUN: 20 mg/dL (ref 6–23)
CO2: 20 mEq/L (ref 19–32)
Calcium: 9.1 mg/dL (ref 8.4–10.5)
Chloride: 108 mEq/L (ref 96–112)
Creat: 0.81 mg/dL (ref 0.50–1.10)
Glucose, Bld: 97 mg/dL (ref 70–99)
Potassium: 3.1 mEq/L — ABNORMAL LOW (ref 3.5–5.3)
Sodium: 136 mEq/L (ref 135–145)
Total Bilirubin: 0.3 mg/dL (ref 0.2–1.2)
Total Protein: 6.6 g/dL (ref 6.0–8.3)

## 2015-04-24 LAB — CBC WITH DIFFERENTIAL/PLATELET
Basophils Absolute: 0.1 10*3/uL (ref 0.0–0.1)
Basophils Relative: 1 % (ref 0–1)
Eosinophils Absolute: 0.1 10*3/uL (ref 0.0–0.7)
Eosinophils Relative: 2 % (ref 0–5)
HCT: 28.9 % — ABNORMAL LOW (ref 36.0–46.0)
Hemoglobin: 8.9 g/dL — ABNORMAL LOW (ref 12.0–15.0)
Lymphocytes Relative: 35 % (ref 12–46)
Lymphs Abs: 2.1 10*3/uL (ref 0.7–4.0)
MCH: 23.5 pg — ABNORMAL LOW (ref 26.0–34.0)
MCHC: 30.8 g/dL (ref 30.0–36.0)
MCV: 76.5 fL — ABNORMAL LOW (ref 78.0–100.0)
MPV: 10.6 fL (ref 8.6–12.4)
Monocytes Absolute: 0.5 10*3/uL (ref 0.1–1.0)
Monocytes Relative: 9 % (ref 3–12)
Neutro Abs: 3.2 10*3/uL (ref 1.7–7.7)
Neutrophils Relative %: 53 % (ref 43–77)
Platelets: 290 10*3/uL (ref 150–400)
RBC: 3.78 MIL/uL — ABNORMAL LOW (ref 3.87–5.11)
RDW: 16.6 % — ABNORMAL HIGH (ref 11.5–15.5)
WBC: 6 10*3/uL (ref 4.0–10.5)

## 2015-04-24 NOTE — Patient Instructions (Signed)
Come back in 6-8 weeks  Great to see you!  Be sure to keep your appointment with Dr. Cristina Gong

## 2015-04-24 NOTE — Assessment & Plan Note (Signed)
Stable on atenolol Continue current medications, labs

## 2015-04-24 NOTE — Assessment & Plan Note (Signed)
Has finally received approval for 1 month supply of rifaximin. Continue rifaximin, has follow-up with GI in 2 weeks. Checking labs, previously hypokalemic due to severe diarrhea, also hypoalbuminemia due to diarrhea related malnutrition

## 2015-04-24 NOTE — Progress Notes (Signed)
Patient ID: Linda Macias, female   DOB: 1923/07/21, 81 y.o.   MRN: [id number]   HPI  Patient presents today for hospital follow-up  She is admitted in late May with intense diarrhea and hypokalemia. She was recently diagnosed with small intestinal bacterial overgrowth with a height of breath test at [place]. She's had a very lengthy history of severe persistent diarrhea despite a complete workup.  She had improvement of symptoms on rifaximin, however her insurance would not approve additional doses so she was changed to Flagyl. After she started Flagyl her diarrhea returned and she had to be admitted for dehydration, hyperkalemia, and persistent diarrhea.  She now has had her rifaximin for the last week. Previously she was making it using her GI doctors samples. She states that she is down to one to 2 bowel movements per day and she is tolerating food well.  Hypertension taking atenolol No chest pain, dyspnea, palpitations. Leg edema, I suspect this is due to hypoalbuminemia after severe weight loss due to this diarrheal illness.  PMH: Smoking status noted ROS: Per HPI  Objective: BP 120/59 mmHg  Pulse 68  Temp(Src) 98.1 F (36.7 C) (Oral)  Ht 5' (1.524 m)  Wt 99 lb (44.906 kg)  BMI 19.33 kg/m2 Gen: NAD, alert, cooperative with exam HEENT: NCAT CV: RRR, good S1/S2, no murmur Resp: CTABL, no wheezes, non-labored Abd: SNTND, BS present, no guarding or organomegaly Ext: 2+ pitting edema bilateral lower extremities Neuro: Alert and oriented, No gross deficits  Assessment and plan:  HYPERTENSION, BENIGN SYSTEMIC Stable on atenolol Continue current medications, labs  Intestinal bacterial overgrowth Has finally received approval for 1 month supply of rifaximin. Continue rifaximin, has follow-up with GI in 2 weeks. Checking labs, previously hypokalemic due to severe diarrhea, also hypoalbuminemia due to diarrhea related malnutrition    Orders Placed This  Encounter  Procedures  . CBC with Differential  . Comprehensive metabolic panel

## 2015-04-25 ENCOUNTER — Encounter: Payer: Self-pay | Admitting: Family Medicine

## 2015-04-29 DIAGNOSIS — E46 Unspecified protein-calorie malnutrition: Secondary | ICD-10-CM | POA: Diagnosis not present

## 2015-04-29 DIAGNOSIS — A049 Bacterial intestinal infection, unspecified: Secondary | ICD-10-CM | POA: Diagnosis not present

## 2015-05-01 DIAGNOSIS — E46 Unspecified protein-calorie malnutrition: Secondary | ICD-10-CM | POA: Diagnosis not present

## 2015-05-01 DIAGNOSIS — A049 Bacterial intestinal infection, unspecified: Secondary | ICD-10-CM | POA: Diagnosis not present

## 2015-05-03 ENCOUNTER — Other Ambulatory Visit: Payer: Self-pay | Admitting: Family Medicine

## 2015-05-03 DIAGNOSIS — I10 Essential (primary) hypertension: Secondary | ICD-10-CM

## 2015-05-06 NOTE — Telephone Encounter (Signed)
Daughter called and would like a refill on her Mother's Atenolol called in. jw

## 2015-05-07 ENCOUNTER — Other Ambulatory Visit: Payer: Self-pay | Admitting: Family Medicine

## 2015-05-07 DIAGNOSIS — I10 Essential (primary) hypertension: Secondary | ICD-10-CM

## 2015-05-07 NOTE — Telephone Encounter (Signed)
Hey Refilled Berenice Bouton Atenolol. Please let me know if she has any trouble getting her prescription

## 2015-05-08 NOTE — Telephone Encounter (Signed)
Looks like rx was printed instead of e-prescribed, rx called into patient pharmacy.

## 2015-05-09 DIAGNOSIS — M47817 Spondylosis without myelopathy or radiculopathy, lumbosacral region: Secondary | ICD-10-CM | POA: Diagnosis not present

## 2015-05-27 DIAGNOSIS — Z86010 Personal history of colonic polyps: Secondary | ICD-10-CM | POA: Diagnosis not present

## 2015-05-27 DIAGNOSIS — Z79899 Other long term (current) drug therapy: Secondary | ICD-10-CM | POA: Diagnosis not present

## 2015-05-27 DIAGNOSIS — D649 Anemia, unspecified: Secondary | ICD-10-CM | POA: Diagnosis not present

## 2015-05-27 DIAGNOSIS — R634 Abnormal weight loss: Secondary | ICD-10-CM | POA: Diagnosis not present

## 2015-05-27 DIAGNOSIS — K6389 Other specified diseases of intestine: Secondary | ICD-10-CM | POA: Diagnosis not present

## 2015-05-27 DIAGNOSIS — R197 Diarrhea, unspecified: Secondary | ICD-10-CM | POA: Diagnosis not present

## 2015-06-17 DIAGNOSIS — K909 Intestinal malabsorption, unspecified: Secondary | ICD-10-CM | POA: Diagnosis not present

## 2015-06-17 DIAGNOSIS — E559 Vitamin D deficiency, unspecified: Secondary | ICD-10-CM | POA: Diagnosis not present

## 2015-06-17 DIAGNOSIS — D509 Iron deficiency anemia, unspecified: Secondary | ICD-10-CM | POA: Diagnosis not present

## 2015-06-18 ENCOUNTER — Telehealth: Payer: Self-pay | Admitting: Internal Medicine

## 2015-06-18 DIAGNOSIS — E876 Hypokalemia: Secondary | ICD-10-CM

## 2015-06-18 NOTE — Telephone Encounter (Signed)
Linda Macias need to have her hospital bed picked up.  No longer have room for it.  Please contact her back to advise.

## 2015-06-20 ENCOUNTER — Ambulatory Visit (INDEPENDENT_AMBULATORY_CARE_PROVIDER_SITE_OTHER): Payer: Medicare Other | Admitting: Family Medicine

## 2015-06-20 VITALS — BP 134/61 | HR 58 | Temp 98.1°F | Wt 102.5 lb

## 2015-06-20 DIAGNOSIS — F5089 Other specified eating disorder: Secondary | ICD-10-CM

## 2015-06-20 DIAGNOSIS — R350 Frequency of micturition: Secondary | ICD-10-CM

## 2015-06-20 DIAGNOSIS — D509 Iron deficiency anemia, unspecified: Secondary | ICD-10-CM | POA: Diagnosis not present

## 2015-06-20 DIAGNOSIS — E441 Mild protein-calorie malnutrition: Secondary | ICD-10-CM

## 2015-06-20 DIAGNOSIS — R600 Localized edema: Secondary | ICD-10-CM

## 2015-06-20 DIAGNOSIS — F508 Other eating disorders: Secondary | ICD-10-CM

## 2015-06-20 LAB — CBC
HCT: 25.7 % — ABNORMAL LOW (ref 36.0–46.0)
Hemoglobin: 7.9 g/dL — ABNORMAL LOW (ref 12.0–15.0)
MCH: 22.4 pg — ABNORMAL LOW (ref 26.0–34.0)
MCHC: 30.7 g/dL (ref 30.0–36.0)
MCV: 73 fL — ABNORMAL LOW (ref 78.0–100.0)
MPV: 10.1 fL (ref 8.6–12.4)
Platelets: 316 10*3/uL (ref 150–400)
RBC: 3.52 MIL/uL — ABNORMAL LOW (ref 3.87–5.11)
RDW: 17 % — ABNORMAL HIGH (ref 11.5–15.5)
WBC: 4.7 10*3/uL (ref 4.0–10.5)

## 2015-06-20 LAB — POCT GLYCOSYLATED HEMOGLOBIN (HGB A1C): Hemoglobin A1C: 5.3

## 2015-06-20 MED ORDER — FUROSEMIDE 20 MG PO TABS: 10.0000 mg | ORAL_TABLET | Freq: Every day | ORAL | Status: DC

## 2015-06-20 NOTE — Patient Instructions (Signed)
Thanks for coming in today.   We need to make sure that there is not something going on with your heart that may be causing the swelling that you are experiencing.   We will get lab work today and will call you with the results.   Schedule an appointment with you primary doctor within the next 1-2 weeks if possible.   Begin taking the lasix 1/2 pill per day. If you start to get cramps then please call the clinic as this may be a sign of hypokalemia.   Also begin using the compression stockings to improve the fluid in your legs.   If you have any shortness of breath, chest pain, or leg pain then please return for evaluation or go to the ED.   Thanks for letting us take care of you!  Sincerely,  Paula Compton, MD Family Medicine - PGY 2

## 2015-06-21 LAB — COMPREHENSIVE METABOLIC PANEL
ALT: 6 U/L (ref 6–29)
AST: 8 U/L — ABNORMAL LOW (ref 10–35)
Albumin: 3.1 g/dL — ABNORMAL LOW (ref 3.6–5.1)
Alkaline Phosphatase: 62 U/L (ref 33–130)
BUN: 26 mg/dL — ABNORMAL HIGH (ref 7–25)
CO2: 22 mmol/L (ref 20–31)
Calcium: 8.5 mg/dL — ABNORMAL LOW (ref 8.6–10.4)
Chloride: 110 mmol/L (ref 98–110)
Creat: 0.93 mg/dL — ABNORMAL HIGH (ref 0.60–0.88)
Glucose, Bld: 80 mg/dL (ref 65–99)
Potassium: 2.6 mmol/L — CL (ref 3.5–5.3)
Sodium: 140 mmol/L (ref 135–146)
Total Bilirubin: 0.2 mg/dL (ref 0.2–1.2)
Total Protein: 6.4 g/dL (ref 6.1–8.1)

## 2015-06-21 LAB — BRAIN NATRIURETIC PEPTIDE: Brain Natriuretic Peptide: 55.8 pg/mL (ref 0.0–100.0)

## 2015-06-23 ENCOUNTER — Other Ambulatory Visit: Payer: Medicare Other

## 2015-06-23 ENCOUNTER — Telehealth: Payer: Self-pay | Admitting: Family Medicine

## 2015-06-23 DIAGNOSIS — E876 Hypokalemia: Secondary | ICD-10-CM

## 2015-06-23 LAB — BASIC METABOLIC PANEL
Anion gap: 10 (ref 5–15)
BUN: 25 mg/dL — ABNORMAL HIGH (ref 6–20)
CO2: 19 mmol/L — ABNORMAL LOW (ref 22–32)
Calcium: 9 mg/dL (ref 8.9–10.3)
Chloride: 110 mmol/L (ref 101–111)
Creatinine, Ser: 0.95 mg/dL (ref 0.44–1.00)
GFR calc Af Amer: 60 mL/min — ABNORMAL LOW (ref >60)
GFR calc non Af Amer: 52 mL/min — ABNORMAL LOW (ref >60)
Glucose, Bld: 103 mg/dL — ABNORMAL HIGH (ref 65–99)
Potassium: 3.1 mmol/L — ABNORMAL LOW (ref 3.5–5.1)
Sodium: 139 mmol/L (ref 135–145)

## 2015-06-23 NOTE — Progress Notes (Signed)
STAT BMP DONE TODAY [name]

## 2015-06-23 NOTE — Telephone Encounter (Signed)
I was paged by the [facility] lab today 8/22 at 11 am about critical potassium of 2.6 for Ms. Linda Macias. It is unclear to [place] whether anyone was paged about this critical result after the results returned over the weekend. I called Ms. Oquendo, and she is doing ok at this time. She was already taking 44meq daily of potassium. She will come in for repeat BMET this afternoon to make sure it has not dropped further since we started lasix at her office visit on Friday. She has only taken one lasix pill since that visit. I instructed her not to take any more lasix until we are able to check her potassium, and to take two 59meq pills after we got off of the phone. She agreed to this, and she will be coming in to clinic for a lab only visit this afternoon to get her potassium checked again.   Paula Compton, MD Family Medicine - PGY 2

## 2015-06-23 NOTE — Telephone Encounter (Signed)
I was paged by the [facility] lab today 8/22 at 11 am about critical potassium of 2.6 for Ms. Linda Macias. It is unclear to [place] whether anyone was paged about this critical result after the results returned over the weekend. I called Ms. Litzinger, and she is doing ok at this time. She was already taking 44meq daily of potassium. She will come in for repeat BMET this afternoon to make sure it has not dropped further since we started lasix at her office visit on Friday. She has only taken one lasix pill since that visit. I instructed her not to take any more lasix until we are able to check her potassium, and to take two 56meq pills after we got off of the phone. She agreed to this, and she will be coming in to clinic for a lab only visit this afternoon to get her potassium checked again.   Paula Compton, MD Family Medicine - PGY 2

## 2015-06-23 NOTE — Telephone Encounter (Signed)
Additionally,   She has a hgb of 7.9 which is down from previous. She will need to restart iron. We discussed this over the phone, and she will resume taking her iron prescription which she has at home currently.   CGM MD

## 2015-06-23 NOTE — Progress Notes (Signed)
Patient ID: Linda Macias, female   DOB: 02-Nov-1922, 81 y.o.   MRN: [id number]   [facility] Aquilla Hacker, MD Phone: [phone]  Subjective:   # Lower Extremity Swelling for 3 months.  - Pt. Presents today for evaluation of lower extremity swelling that has been ongoing for the past three months.  - she has a history of FTT and chronic diarrhea that has now become controlled. She additionally has htn, and hx of a CVA without any residual defiicts. Additionally, she has a history of chronic microcytic anemia due to iron deficiency.   - She has no chest pain, shortness of breath, orthopnea, PND, urinary frequency, urgency. She does not have any cough, palpitations, neck pain, arm pain, nausea, or abdominal pain.  - she says that the swelling came on suddenly three months ago and has been worsening since.  - it is pitting - they have used compression stockings intermittently when her daughter can get her to wear them.  - she says that she drinks "lots of cold water because I crave it". Additionally, she craves ice and has for some time.  - she says that she does not eat any other odd food or other items.  - no vision changes, or headaches.  - she says she drinks the water mostly because she likes the taste / feel of it and not because she has increased thirst.  - she does not notice increased urinary frequency.  - she has never had this swelling before.  - her malnutrition has mostly resolved with the resolution of the chronic diarrhea.  - she has not had any calf pain, or redness in either leg.  - her last albumin and protein counts were within range.   All relevant systems were reviewed and were negative unless otherwise noted in the HPI  Past Medical History Reviewed problem list.  Medications- reviewed and updated Current Outpatient Prescriptions  Medication Sig Dispense Refill  . atenolol (TENORMIN) 50 MG tablet Take 50 mg by mouth daily.     Marland Kitchen atenolol  (TENORMIN) 50 MG tablet TAKE 1 TABLET BY MOUTH EVERY DAY 90 tablet 0  . Cholecalciferol (VITAMIN D3) 5000 UNITS TABS Take 5,000 Units by mouth daily.    . ferrous sulfate 325 (65 FE) MG EC tablet Take 1 tablet (325 mg total) by mouth daily with breakfast. (Patient not taking: Reported on 11/24/2014) 30 tablet 3  . furosemide (LASIX) 20 MG tablet Take 0.5 tablets (10 mg total) by mouth daily. 30 tablet 3  . loperamide (IMODIUM) 2 MG capsule Take 2 mg by mouth 3 (three) times daily as needed for diarrhea or loose stools.    . mometasone (NASONEX) 50 MCG/ACT nasal spray Place 2 sprays into the nose daily. (Patient not taking: Reported on 04/24/2015) 17 g 12  . Multiple Vitamins-Minerals (MULTI ADULT GUMMIES) CHEW Chew 2 tablets by mouth daily.    . potassium chloride SA (K-DUR,KLOR-CON) 20 MEQ tablet Take 1 tablet (20 mEq total) by mouth daily. 60 tablet 3  . rifaximin (XIFAXAN) 550 MG TABS tablet Take 1 tablet (550 mg total) by mouth 3 (three) times daily. 60 tablet 0   No current facility-administered medications for this visit.   Chief complaint-noted No additions to family history Social history- patient is a non smoker  Objective: BP 134/61 mmHg  Pulse 58  Temp(Src) 98.1 F (36.7 C) (Oral)  Wt 102 lb 8 oz (46.494 kg) Gen: NAD, alert, cooperative with exam HEENT:  NCAT, EOMI, PERRL Neck: FROM, supple, no thyromegaly CV: RRR, good S1/S2, no murmur, cap refill <3 Resp: CTABL, no wheezes, non-labored Abd: SNTND, BS present, no guarding or organomegaly Ext: significant 3+ edema up to the mid-shin bilaterally, palpable pulses distally, no posterior calf pain, warm, normal tone, moves UE/LE spontaneously Neuro: Alert and oriented, No gross deficits Skin: no rashes no lesions  Assessment/Plan:  # Lower Extremity Pitting Edema - Differential remains broad at this point, no crackles or extra heart sounds to exam and she is not having respiratory symptoms. She is drinking 8-10, 10 fl oz bottles  of water daily. She also craves ice [facility]). May be fluid overload from increased intake, High output HF from chronic anemia, or other cardiomyopathy. Also need to consider reduced oncotic pressure due to malnutrition, but this is less likely given that she has mostly recovered from this. Thyroid remains on the differential. Given her age, need to rule out cardiac etiology first. She also needs CBC for evaluation of her anemia as this may also be contributing.  - will get BNP, CMP, CBC, Echocardiogram (she has never had one) - start low dose of lasix - she is already on potassium supplementation due to GI potassium losses.  - Should get TSH as well.  - compression stockings to bilateral legs and elevation while at home.  - will rule out cardiac etiology first and then proceed from there.  - follow up in 1-2 weeks with your pcp for ongoing workup.    Paula Compton, MD Family Medicine - PGY 2

## 2015-06-24 ENCOUNTER — Telehealth: Payer: Self-pay | Admitting: Family Medicine

## 2015-06-24 NOTE — Telephone Encounter (Signed)
Called to discuss K - results with Ms. Linda Macias. No answer. Will call again tomorrow. She needs to double up on her potassium over the next couple of days. Will see her back in clinic in the next 1-2 weeks if possible.   CGM MD

## 2015-06-25 NOTE — Telephone Encounter (Signed)
Spoke with patient and she is aware of results.  Confirmed appt with Dr .Emmaline Life for 07-04-15.  Jazmin Hartsell,CMA

## 2015-07-04 ENCOUNTER — Encounter: Payer: Self-pay | Admitting: Internal Medicine

## 2015-07-04 ENCOUNTER — Ambulatory Visit (INDEPENDENT_AMBULATORY_CARE_PROVIDER_SITE_OTHER): Payer: Medicare Other | Admitting: Internal Medicine

## 2015-07-04 ENCOUNTER — Other Ambulatory Visit: Payer: Self-pay | Admitting: *Deleted

## 2015-07-04 VITALS — BP 135/57 | HR 59 | Temp 97.9°F | Ht 63.0 in | Wt 98.0 lb

## 2015-07-04 DIAGNOSIS — E876 Hypokalemia: Secondary | ICD-10-CM | POA: Diagnosis not present

## 2015-07-04 DIAGNOSIS — K529 Noninfective gastroenteritis and colitis, unspecified: Secondary | ICD-10-CM

## 2015-07-04 DIAGNOSIS — I10 Essential (primary) hypertension: Secondary | ICD-10-CM

## 2015-07-04 DIAGNOSIS — K6389 Other specified diseases of intestine: Secondary | ICD-10-CM

## 2015-07-04 DIAGNOSIS — D509 Iron deficiency anemia, unspecified: Secondary | ICD-10-CM | POA: Diagnosis not present

## 2015-07-04 DIAGNOSIS — R197 Diarrhea, unspecified: Secondary | ICD-10-CM

## 2015-07-04 LAB — BASIC METABOLIC PANEL
BUN: 17 mg/dL (ref 7–25)
CO2: 19 mmol/L — ABNORMAL LOW (ref 20–31)
Calcium: 9.4 mg/dL (ref 8.6–10.4)
Chloride: 109 mmol/L (ref 98–110)
Creat: 0.93 mg/dL — ABNORMAL HIGH (ref 0.60–0.88)
Glucose, Bld: 88 mg/dL (ref 65–99)
Potassium: 5 mmol/L (ref 3.5–5.3)
Sodium: 143 mmol/L (ref 135–146)

## 2015-07-04 LAB — FOLATE: Folate: 9.8 ng/mL

## 2015-07-04 LAB — VITAMIN B12: Vitamin B-12: 260 pg/mL (ref 211–911)

## 2015-07-04 NOTE — Patient Instructions (Addendum)
Thank you for coming in. Please continue to use compression stockings. Continue medications as discussed. Please continue trying to increase iron with food intake. Please see me back in 2 weeks as needed. Please follow up with nutrition for further recommendations about weight.

## 2015-07-04 NOTE — Assessment & Plan Note (Signed)
Microcytic Anemia - hgb 7.9 8/19.  Low iron at 21 in May. Likely due to malabsorption from diagnosis of  intestinal bacterial overgrowth  - Patient does not want to take iron supplemental, state that they make her feel bad  - Provided chart of foods high in iron, patient states that she will try to increase dietary intake iron

## 2015-07-04 NOTE — Assessment & Plan Note (Signed)
Well controlled, stable  - Currently on atenolol and chlorthalidone

## 2015-07-04 NOTE — Progress Notes (Signed)
Patient ID: Linda Macias, female   DOB: 08-24-1923, 81 y.o.   MRN: [id number]    [facility] [name], MD Phone: [phone]  Subjective:    No problems per patient. Patient leg swelling improved with compression stockings.   All relevant systems were reviewed and were negative unless otherwise noted in the HPI  Past Medical History Reviewed problem list.  Medications- reviewed and updated Current Outpatient Prescriptions  Medication Sig Dispense Refill  . atenolol (TENORMIN) 50 MG tablet Take 50 mg by mouth daily.     Marland Kitchen atenolol (TENORMIN) 50 MG tablet TAKE 1 TABLET BY MOUTH EVERY DAY 90 tablet 0  . Cholecalciferol (VITAMIN D3) 5000 UNITS TABS Take 5,000 Units by mouth daily.    . ferrous sulfate 325 (65 FE) MG EC tablet Take 1 tablet (325 mg total) by mouth daily with breakfast. (Patient not taking: Reported on 11/24/2014) 30 tablet 3  . furosemide (LASIX) 20 MG tablet Take 0.5 tablets (10 mg total) by mouth daily. 30 tablet 3  . loperamide (IMODIUM) 2 MG capsule Take 2 mg by mouth 3 (three) times daily as needed for diarrhea or loose stools.    . mometasone (NASONEX) 50 MCG/ACT nasal spray Place 2 sprays into the nose daily. (Patient not taking: Reported on 04/24/2015) 17 g 12  . Multiple Vitamins-Minerals (MULTI ADULT GUMMIES) CHEW Chew 2 tablets by mouth daily.    . potassium chloride SA (K-DUR,KLOR-CON) 20 MEQ tablet Take 1 tablet (20 mEq total) by mouth daily. 60 tablet 3  . rifaximin (XIFAXAN) 550 MG TABS tablet Take 1 tablet (550 mg total) by mouth 3 (three) times daily. 60 tablet 0   No current facility-administered medications for this visit.   Chief complaint-noted No additions to family history Social history- patient is a non smoker  Objective: BP 135/57 mmHg  Pulse 59  Temp(Src) 97.9 F (36.6 C) (Axillary)  Ht 5\' 3"  (1.6 m)  Wt 98 lb (44.453 kg)  BMI 17.36 kg/m2 Gen: NAD, alert, cooperative with exam Neck: FROM, supple CV: RRR,  good S1/S2, no murmur, cap refill <3 Resp: CTABL, no wheezes, non-labored Abd: SNTND, BS present, no guarding or organomegaly Ext: 3+ pitting edema in to knee in bilateral legs.  Skin: no rashes no lesions  Assessment/Plan: HYPERTENSION, BENIGN SYSTEMIC Well controlled, stable  - Currently on atenolol and chlorthalidone   Chronic diarrhea Chronic Diarrhea  - Continues to have 1-3 soft stools per day, no blood  - Take Imodium daily for this   Hypokalemia Hx of Hypokalemia previously  - Will recheck BMET today for potassium  - Patient taking KDUR 20 meq BID   Microcytic anemia Microcytic Anemia - hgb 7.9 8/19.  Low iron at 21 in May. Likely due to malabsorption from diagnosis of  intestinal bacterial overgrowth  - Patient does not want to take iron supplemental, state that they make her feel bad  - Provided chart of foods high in iron, patient states that she will try to increase dietary intake iron    Intestinal bacterial overgrowth Intestinal Bacterial Overgrowth: Patient is followed by GI. Has had extensive work up. Patient may have vitamin defiencies including Vitamin A and B12. Increased  motility and nutrient malabsorption is associated with SIBO. Can result in systemic symptoms associated with elevated levels of toxins - Patient to make an appointment with nutrition to determine further recommendation for gaining weight  - Rifaximin (stopped in June due to insurance, daughter working on this)  -  Will check B12 and folate due to malabsorption  - Continue multi-vitamin

## 2015-07-04 NOTE — Assessment & Plan Note (Signed)
Chronic Diarrhea  - Continues to have 1-3 soft stools per day, no blood  - Take Imodium daily for this

## 2015-07-04 NOTE — Assessment & Plan Note (Signed)
Intestinal Bacterial Overgrowth: Patient is followed by GI. Has had extensive work up. Patient may have vitamin defiencies including Vitamin A and B12. Increased  motility and nutrient malabsorption is associated with SIBO. Can result in systemic symptoms associated with elevated levels of toxins - Patient to make an appointment with nutrition to determine further recommendation for gaining weight  - Rifaximin (stopped in June due to insurance, daughter working on this)  - Will check B12 and folate due to malabsorption  - Continue multi-vitamin

## 2015-07-04 NOTE — Assessment & Plan Note (Addendum)
Hx of Hypokalemia previously  - Will recheck BMET today for potassium  - Patient taking KDUR 20 meq BID

## 2015-07-05 ENCOUNTER — Telehealth: Payer: Self-pay | Admitting: Internal Medicine

## 2015-07-05 LAB — CBC
HCT: 28.1 % — ABNORMAL LOW (ref 36.0–46.0)
Hemoglobin: 8.4 g/dL — ABNORMAL LOW (ref 12.0–15.0)
MCH: 22.1 pg — ABNORMAL LOW (ref 26.0–34.0)
MCHC: 29.9 g/dL — ABNORMAL LOW (ref 30.0–36.0)
MCV: 73.9 fL — ABNORMAL LOW (ref 78.0–100.0)
Platelets: 305 10*3/uL (ref 150–400)
RBC: 3.8 MIL/uL — ABNORMAL LOW (ref 3.87–5.11)
RDW: 18.2 % — ABNORMAL HIGH (ref 11.5–15.5)
WBC: 4.6 10*3/uL (ref 4.0–10.5)

## 2015-07-05 MED ORDER — POTASSIUM CHLORIDE CRYS ER 20 MEQ PO TBCR: 20.0000 meq | EXTENDED_RELEASE_TABLET | Freq: Once | ORAL | Status: DC

## 2015-07-05 MED ORDER — POTASSIUM CHLORIDE CRYS ER 20 MEQ PO TBCR: 20.0000 meq | EXTENDED_RELEASE_TABLET | Freq: Two times a day (BID) | ORAL | Status: DC

## 2015-07-06 NOTE — Telephone Encounter (Signed)
I discussed the results of Linda Macias labs with her. Let her know that I refilled her  KDUR and told her to continue taking  20 meq of KDUR once a day, rather than twice a day as she has been doing for the past few days.

## 2015-07-11 ENCOUNTER — Telehealth: Payer: Self-pay | Admitting: *Deleted

## 2015-07-11 NOTE — Telephone Encounter (Signed)
-----   Message from Petrey, MD sent at 07/05/2015  6:59 PM EDT ----- Ms. Linda Macias has a referral for an ECHO order pending. Please call to schedule the appointment for her. Thank you so much. [name]

## 2015-07-11 NOTE — Telephone Encounter (Signed)
Spoke with ms tillman and informed her of her appt at [facility] ([address], [address]). It was scheduled on 07/18/15 @ 10:30, must arrive @ 10:15. Pt said she couldn't make that appt so i gave her their number to schedule. [phone] Hanna, CMA

## 2015-07-18 ENCOUNTER — Other Ambulatory Visit (HOSPITAL_COMMUNITY): Payer: Medicare Other

## 2015-07-29 DIAGNOSIS — K580 Irritable bowel syndrome with diarrhea: Secondary | ICD-10-CM | POA: Diagnosis not present

## 2015-07-29 DIAGNOSIS — Z681 Body mass index (BMI) 19 or less, adult: Secondary | ICD-10-CM | POA: Diagnosis not present

## 2015-07-29 DIAGNOSIS — K6389 Other specified diseases of intestine: Secondary | ICD-10-CM | POA: Diagnosis not present

## 2015-07-29 DIAGNOSIS — R634 Abnormal weight loss: Secondary | ICD-10-CM | POA: Diagnosis not present

## 2015-07-29 DIAGNOSIS — Z79899 Other long term (current) drug therapy: Secondary | ICD-10-CM | POA: Diagnosis not present

## 2015-08-05 DIAGNOSIS — D509 Iron deficiency anemia, unspecified: Secondary | ICD-10-CM | POA: Diagnosis not present

## 2015-08-11 ENCOUNTER — Ambulatory Visit (HOSPITAL_COMMUNITY): Payer: Medicare Other | Attending: Internal Medicine

## 2015-08-11 ENCOUNTER — Other Ambulatory Visit: Payer: Self-pay

## 2015-08-11 DIAGNOSIS — I340 Nonrheumatic mitral (valve) insufficiency: Secondary | ICD-10-CM | POA: Insufficient documentation

## 2015-08-11 DIAGNOSIS — R600 Localized edema: Secondary | ICD-10-CM | POA: Diagnosis not present

## 2015-08-11 DIAGNOSIS — I313 Pericardial effusion (noninflammatory): Secondary | ICD-10-CM | POA: Insufficient documentation

## 2015-08-11 DIAGNOSIS — I10 Essential (primary) hypertension: Secondary | ICD-10-CM | POA: Diagnosis not present

## 2015-08-12 ENCOUNTER — Encounter: Payer: Self-pay | Admitting: Family Medicine

## 2015-08-12 DIAGNOSIS — I519 Heart disease, unspecified: Secondary | ICD-10-CM | POA: Insufficient documentation

## 2015-08-12 DIAGNOSIS — I5189 Other ill-defined heart diseases: Secondary | ICD-10-CM | POA: Insufficient documentation

## 2015-08-13 DIAGNOSIS — D509 Iron deficiency anemia, unspecified: Secondary | ICD-10-CM | POA: Diagnosis not present

## 2015-08-29 ENCOUNTER — Other Ambulatory Visit: Payer: Self-pay | Admitting: *Deleted

## 2015-08-29 DIAGNOSIS — I10 Essential (primary) hypertension: Secondary | ICD-10-CM

## 2015-08-31 MED ORDER — ATENOLOL 50 MG PO TABS: 50.0000 mg | ORAL_TABLET | Freq: Every day | ORAL | Status: DC

## 2015-10-07 DIAGNOSIS — R634 Abnormal weight loss: Secondary | ICD-10-CM | POA: Diagnosis not present

## 2015-10-07 DIAGNOSIS — K580 Irritable bowel syndrome with diarrhea: Secondary | ICD-10-CM | POA: Diagnosis not present

## 2015-10-07 DIAGNOSIS — K6389 Other specified diseases of intestine: Secondary | ICD-10-CM | POA: Diagnosis not present

## 2015-10-13 DIAGNOSIS — K9089 Other intestinal malabsorption: Secondary | ICD-10-CM | POA: Diagnosis not present

## 2015-10-13 DIAGNOSIS — R634 Abnormal weight loss: Secondary | ICD-10-CM | POA: Diagnosis not present

## 2015-10-13 DIAGNOSIS — D649 Anemia, unspecified: Secondary | ICD-10-CM | POA: Diagnosis not present

## 2015-10-21 DIAGNOSIS — D649 Anemia, unspecified: Secondary | ICD-10-CM | POA: Diagnosis not present

## 2015-12-10 DIAGNOSIS — D649 Anemia, unspecified: Secondary | ICD-10-CM | POA: Diagnosis not present

## 2015-12-22 ENCOUNTER — Other Ambulatory Visit: Payer: Self-pay | Admitting: *Deleted

## 2015-12-22 DIAGNOSIS — I10 Essential (primary) hypertension: Secondary | ICD-10-CM

## 2015-12-23 MED ORDER — ATENOLOL 50 MG PO TABS: 50.0000 mg | ORAL_TABLET | Freq: Every day | ORAL | Status: DC

## 2015-12-30 ENCOUNTER — Telehealth: Payer: Self-pay | Admitting: *Deleted

## 2015-12-30 NOTE — Telephone Encounter (Signed)
Called patient to offer flu vaccine.  Patient declined.  Raegan Winders L, RN     

## 2016-01-01 ENCOUNTER — Other Ambulatory Visit: Payer: Self-pay | Admitting: Internal Medicine

## 2016-01-01 NOTE — Telephone Encounter (Signed)
Pt called because she has carpal tunnel in her hand and would like the doctor to call in some Voltaren gel. She has used this in the past but doesn't use it daily on when needed. Please let her know if we can send this in or does she need an appointment. Please let her know either way. jw

## 2016-01-02 MED ORDER — DICLOFENAC SODIUM 1 % TD GEL: 2.0000 g | Freq: Four times a day (QID) | TRANSDERMAL | Status: DC

## 2016-01-02 NOTE — Telephone Encounter (Signed)
I have provided voltaren gel for patient, however I would like her to make an appointment to see me as soon as she can to discuss her pain

## 2016-01-05 NOTE — Telephone Encounter (Signed)
Left message on voicemail informing patient of below, asked that she call back to schedule an appointment.

## 2016-01-12 ENCOUNTER — Telehealth: Payer: Self-pay | Admitting: *Deleted

## 2016-01-12 NOTE — Telephone Encounter (Signed)
Prior Authorization received from [facility] for Diclofenac sodium 1% gel.  PA form placed in provider box for completion. Derl Barrow, RN

## 2016-01-14 NOTE — Telephone Encounter (Signed)
PA form faxed to [facility] for review.  Review process could take 24-72 hours to complete.  Rayaan Lorah L, RN  

## 2016-01-15 ENCOUNTER — Other Ambulatory Visit: Payer: Self-pay | Admitting: *Deleted

## 2016-01-16 NOTE — Telephone Encounter (Signed)
PA approved for Diclofenac gel 1% until 10/31/2016.  Reference number: [id number].  Derl Barrow, RN

## 2016-01-22 ENCOUNTER — Ambulatory Visit (INDEPENDENT_AMBULATORY_CARE_PROVIDER_SITE_OTHER): Payer: Medicare Other | Admitting: Internal Medicine

## 2016-01-22 ENCOUNTER — Encounter: Payer: Self-pay | Admitting: Internal Medicine

## 2016-01-22 VITALS — BP 120/59 | HR 61 | Temp 97.7°F | Resp 18 | Ht 62.0 in | Wt 105.4 lb

## 2016-01-22 DIAGNOSIS — G5602 Carpal tunnel syndrome, left upper limb: Secondary | ICD-10-CM | POA: Diagnosis not present

## 2016-01-22 DIAGNOSIS — I10 Essential (primary) hypertension: Secondary | ICD-10-CM

## 2016-01-22 DIAGNOSIS — E559 Vitamin D deficiency, unspecified: Secondary | ICD-10-CM | POA: Diagnosis not present

## 2016-01-22 LAB — BASIC METABOLIC PANEL WITH GFR
BUN: 16 mg/dL (ref 7–25)
CO2: 20 mmol/L (ref 20–31)
Calcium: 8.9 mg/dL (ref 8.6–10.4)
Chloride: 110 mmol/L (ref 98–110)
Creat: 1.01 mg/dL — ABNORMAL HIGH (ref 0.60–0.88)
GFR, Est African American: 57 mL/min — ABNORMAL LOW (ref >=60)
GFR, Est Non African American: 49 mL/min — ABNORMAL LOW (ref >=60)
Glucose, Bld: 76 mg/dL (ref 65–99)
Potassium: 3.2 mmol/L — ABNORMAL LOW (ref 3.5–5.3)
Sodium: 139 mmol/L (ref 135–146)

## 2016-01-22 LAB — CBC
HCT: 27.2 % — ABNORMAL LOW (ref 36.0–46.0)
Hemoglobin: 8.2 g/dL — ABNORMAL LOW (ref 12.0–15.0)
MCH: 23.2 pg — ABNORMAL LOW (ref 26.0–34.0)
MCHC: 30.1 g/dL (ref 30.0–36.0)
MCV: 76.8 fL — ABNORMAL LOW (ref 78.0–100.0)
MPV: 10.2 fL (ref 8.6–12.4)
Platelets: 300 10*3/uL (ref 150–400)
RBC: 3.54 MIL/uL — ABNORMAL LOW (ref 3.87–5.11)
RDW: 15.9 % — ABNORMAL HIGH (ref 11.5–15.5)
WBC: 5.3 10*3/uL (ref 4.0–10.5)

## 2016-01-22 NOTE — Progress Notes (Signed)
   [facility] [name], MD Phone: [phone]  Reason For Visit: Carpal tunnel pain/HTN follow up.   # HTN, controlled.  No issues with meds. No chest pain, dizziness, palpations.   # Carpal Tunnel of left.: Pain in left fingers digits 2 through 5, no specific to palmar vs dorsal side of hand. She states this pain is worse when waking up in the morning. She denies any swelling, except for an incident where she used a cream that created a contact reaction. Patient denies any numbness in her fingers she states that she has been treated for carpal tunnel of the right hand with a surgical procedure in 2013. She also had a injection in the left hand during that time which resulted in her having no further issues with her carpal tunnel. She does have the Voltaren gel which she uses for relief. She indicates increased weakness and left hand having a hard time grasping her dishes with that hand.   Past Medical History Reviewed problem list.  Medications- reviewed and updated No additions to family history Social history- patient is a non.-smoker  Objective: BP 120/59 mmHg  Pulse 61  Temp(Src) 97.7 F (36.5 C) (Oral)  Resp 18  Ht 5\' 2"  (1.575 m)  Wt 105 lb 6.4 oz (47.809 kg)  BMI 19.27 kg/m2  SpO2 100% Gen: NAD, alert, cooperative with exam HEENT: NCAT, EOMI, PERRL, TMs nml CV: RRR, good S1/S2, no murmur, radial pulses 2+  Resp: CTABL, no wheezes, non-labored Left hand: decreased strength in left thumb/5 digit compared to right. Not a positive phalen test. No thenar atrophy noted.  Skin: no rashes no lesions  Assessment/Plan:    Carpal tunnel syndrome Atypical carpal tunnel symptoms, however patient has a history of carpal tunnel treated by orthopedics. Receiving  significant relief from left carpal canal injection in 2013. No thenar atrophy noted, no positive phalen's on this exam  - Will have patient follow up in the procedural clinic for carpal canal  steroid injection    HYPERTENSION, BENIGN SYSTEMIC Well controlled  - Will get BMET and CBC  - Continue atenolol and chorthalidone

## 2016-01-22 NOTE — Assessment & Plan Note (Signed)
Atypical carpal tunnel symptoms, however patient has a history of carpal tunnel treated by orthopedics. Receiving  significant relief from left carpal canal injection in 2013. No thenar atrophy noted, no positive phalen's on this exam  - Will have patient follow up in the procedural clinic for carpal canal steroid injection

## 2016-01-22 NOTE — Patient Instructions (Addendum)
Continue voltaren gel. We will referral you to procedural clinic for a steroid shot. We are also going to check your blood levels as well.

## 2016-01-22 NOTE — Assessment & Plan Note (Signed)
Well controlled  - Will get BMET and CBC  - Continue atenolol and chorthalidone

## 2016-01-23 ENCOUNTER — Telehealth: Payer: Self-pay | Admitting: Internal Medicine

## 2016-01-23 DIAGNOSIS — D509 Iron deficiency anemia, unspecified: Secondary | ICD-10-CM

## 2016-01-23 LAB — VITAMIN D 25 HYDROXY (VIT D DEFICIENCY, FRACTURES): Vit D, 25-Hydroxy: 18 ng/mL — ABNORMAL LOW (ref 30–100)

## 2016-01-23 MED ORDER — FERROUS SULFATE 325 (65 FE) MG PO TBEC: 325.0000 mg | DELAYED_RELEASE_TABLET | Freq: Two times a day (BID) | ORAL | Status: DC

## 2016-01-23 MED ORDER — VITAMIN D (ERGOCALCIFEROL) 1.25 MG (50000 UNIT) PO CAPS: 50000.0000 [IU] | ORAL_CAPSULE | ORAL | Status: AC

## 2016-01-23 MED ORDER — CHLORTHALIDONE 25 MG PO TABS: 12.5000 mg | ORAL_TABLET | Freq: Every day | ORAL | Status: DC

## 2016-01-23 NOTE — Telephone Encounter (Signed)
Discussed patient's results with her. Patient is vitamin D deficient, therefore will start patient of vitamin D 50,000 every 7 days for six weeks. Patient's potassium is 3.2 and blood pressure at 120-130s / 60-50s  significantly below JNC8 recommended goal blood pressure, therefore will decrease chlorthalidone to half a pill and then reassess at next visit. Would consider stopping altogether at next visit. Finally patient is anemic, already taking ferrous sulfate once daily, patient agreed reticently to twice daily.

## 2016-02-09 ENCOUNTER — Encounter: Payer: Self-pay | Admitting: Internal Medicine

## 2016-02-09 ENCOUNTER — Ambulatory Visit (INDEPENDENT_AMBULATORY_CARE_PROVIDER_SITE_OTHER): Payer: Medicare Other | Admitting: Internal Medicine

## 2016-02-09 DIAGNOSIS — G5602 Carpal tunnel syndrome, left upper limb: Secondary | ICD-10-CM | POA: Diagnosis not present

## 2016-02-09 DIAGNOSIS — I10 Essential (primary) hypertension: Secondary | ICD-10-CM | POA: Diagnosis not present

## 2016-02-09 LAB — BASIC METABOLIC PANEL WITH GFR
BUN: 26 mg/dL — ABNORMAL HIGH (ref 7–25)
CO2: 18 mmol/L — ABNORMAL LOW (ref 20–31)
Calcium: 9.1 mg/dL (ref 8.6–10.4)
Chloride: 114 mmol/L — ABNORMAL HIGH (ref 98–110)
Creat: 0.77 mg/dL (ref 0.60–0.88)
GFR, Est African American: 79 mL/min (ref >=60)
GFR, Est Non African American: 68 mL/min (ref >=60)
Glucose, Bld: 75 mg/dL (ref 65–99)
Potassium: 3.9 mmol/L (ref 3.5–5.3)
Sodium: 139 mmol/L (ref 135–146)

## 2016-02-09 MED ORDER — METHYLPREDNISOLONE ACETATE 40 MG/ML IJ SUSP: 40.0000 mg | Freq: Once | INTRAMUSCULAR | Status: DC

## 2016-02-09 MED ORDER — METHYLPREDNISOLONE ACETATE 40 MG/ML IJ SUSP
20.0000 mg | Freq: Once | INTRAMUSCULAR | Status: AC
  Administered 2016-02-09: 20 mg via INTRA_ARTICULAR

## 2016-02-09 NOTE — Assessment & Plan Note (Signed)
Discussed precautions, provided DepoMedrol injection

## 2016-02-09 NOTE — Patient Instructions (Addendum)
You received any steroid injection for carpal tunnel. We are going recheck your blood today to see what your potassium level. Follow up in 3 months. Continue not taking the chlorthalidone. Any signs of infection at injection site please call the family medicine

## 2016-02-09 NOTE — Progress Notes (Signed)
   [facility] [name], MD Phone: [phone]  Reason For Visit: Follow up for Carpal Tunnel Shot   # Patient is here for carpal tunnel shot. Patient with continued symptoms. See previous not for discussion of symptoms and clinical reasoning   # Hypertension: stopped chlorthalidone due to goal being meet. No issues with blurry vision, headaches, dizziness, chest pain, etc.   Past Medical History Reviewed problem list.  Medications- reviewed and updated No additions to family history Social history- patient is a non-smoker  Objective: Wt 110 lb 14.4 oz (50.304 kg) Gen: NAD, alert, cooperative with exam, cachetic  CV: RRR, good S1/S2, no murmur, radial pulses 2+ Resp: CTABL, no wheezes, non-labored Abd: SNTND, BS present, no guarding or organomegaly Skin: no rashes no lesions  Procedure Note: Carpal Tunnel Injection Right Written and Verbal consent obtained. Discussed the risks and benefits of the procedure. Palmar aspect of wrist was prepped in a sterile fashion. 20 mg DepoMedrol and 0.5 cc Lidocaine without Epinephrine was injected in a distal direction at the distal palmar wrist crease in line with the 4th digit using a 25 gauge 1.5 inch needle. Patient tolerated well. No complication. Hemostasis achieved. Band Aid applied.   Assessment/Plan:  See problem based a/p  HYPERTENSION, BENIGN SYSTEMIC Patient had previously been on chlorthalidone for many years with blood pressures in the 120s/ 50s. At previous visit discussed stopping this medication as JNC8 for age over 65 is 150/90. Furthermore patient's potassium level was low on last BMET..  - Stop chlorthalidone  - Recheck BMET today, if potassium elevated then stop KDUR   Carpal tunnel syndrome Discussed precautions, provided DepoMedrol injection

## 2016-02-09 NOTE — Assessment & Plan Note (Signed)
Patient had previously been on chlorthalidone for many years with blood pressures in the 120s/ 50s. At previous visit discussed stopping this medication as JNC8 for age over 106 is 150/90. Furthermore patient's potassium level was low on last BMET..  - Stop chlorthalidone  - Recheck BMET today, if potassium elevated then stop [facility]

## 2016-03-05 ENCOUNTER — Ambulatory Visit: Payer: Medicare Other | Admitting: Internal Medicine

## 2016-03-09 ENCOUNTER — Ambulatory Visit (INDEPENDENT_AMBULATORY_CARE_PROVIDER_SITE_OTHER): Payer: Medicare Other | Admitting: Internal Medicine

## 2016-03-09 VITALS — BP 135/65 | HR 67 | Ht 62.0 in | Wt 114.0 lb

## 2016-03-09 DIAGNOSIS — E876 Hypokalemia: Secondary | ICD-10-CM

## 2016-03-09 DIAGNOSIS — I10 Essential (primary) hypertension: Secondary | ICD-10-CM | POA: Diagnosis not present

## 2016-03-09 DIAGNOSIS — G459 Transient cerebral ischemic attack, unspecified: Secondary | ICD-10-CM | POA: Insufficient documentation

## 2016-03-09 MED ORDER — POTASSIUM CHLORIDE CRYS ER 20 MEQ PO TBCR: 20.0000 meq | EXTENDED_RELEASE_TABLET | Freq: Two times a day (BID) | ORAL | Status: DC

## 2016-03-09 NOTE — Progress Notes (Signed)
   [facility] [name], MD Phone: [phone]  Reason For Visit: Blood pressure follow up   # Blood pressure, diet controlled. Not on any blood pressure medications anymore. No SOB, No chest pain, no palpations. Patient still taking KDUR 20 mg twice daily.   # Discussed patient's end of care wishes, patient is DNR/DNI and POA is her daughter   Past Medical History Reviewed problem list.  Medications- reviewed and updated No additions to family history  Objective: BP 135/65 mmHg  Pulse 67  Ht 5\' 2"  (1.575 m)  Wt 114 lb (51.71 kg)  BMI 20.85 kg/m2 Gen: NAD, Elderly female, doing well CV: RRR, good S1/S2, no murmur, radial pulses 2+ Resp: CTABL, no wheezes, non-labored  Assessment/Plan: See problem based a/p  HYPERTENSION, BENIGN SYSTEMIC Blood pressure now well controlled simply with diet. Patient stopped both chorthalidone and atenolol. - Will follow up in 6 months   Hypokalemia No longer on any potassium wasting agents for about 1 month  Currently on KDUR 20 mg BID  Rechecked BMET to ensure no hypokalemia, K+ 4.1, continue KDUR to maintain potassium levels

## 2016-03-10 LAB — BASIC METABOLIC PANEL
BUN: 21 mg/dL (ref 7–25)
CO2: 18 mmol/L — ABNORMAL LOW (ref 20–31)
Calcium: 8.9 mg/dL (ref 8.6–10.4)
Chloride: 113 mmol/L — ABNORMAL HIGH (ref 98–110)
Creat: 1.13 mg/dL — ABNORMAL HIGH (ref 0.60–0.88)
Glucose, Bld: 79 mg/dL (ref 65–99)
Potassium: 4.1 mmol/L (ref 3.5–5.3)
Sodium: 140 mmol/L (ref 135–146)

## 2016-03-10 NOTE — Assessment & Plan Note (Signed)
No longer on any potassium wasting agents for about 1 month  Currently on KDUR 20 mg BID  Rechecked BMET to ensure no hypokalemia, K+ 4.1, continue KDUR to maintain potassium levels

## 2016-03-10 NOTE — Assessment & Plan Note (Signed)
Blood pressure now well controlled simply with diet. Patient stopped both chorthalidone and atenolol. - Will follow up in 6 months

## 2016-03-21 IMAGING — CT CT ABD-PELV W/ CM
3 of 5 series · 12 of 36 positions shown, 18 images · IV contrast (READICAT/WATER & [ID] OMNI 300)
Comparison: None.

CLINICAL DATA: Mid abdominal pain.  Constipation.  Weight loss.

EXAM:
CT ABDOMEN AND PELVIS WITH CONTRAST
TECHNIQUE: Multidetector CT imaging of the abdomen and pelvis was performed
using the standard protocol following bolus administration of
intravenous contrast.
CONTRAST:  100mL OMNIPAQUE IOHEXOL 300 MG/ML  SOLN

[Series 3: abd/pelvis with · axial · 0.70mm/px · z∈[-293,+27]mm · 7 of 86 slices shown, 12 images]
[im 11/86  soft-tissue]
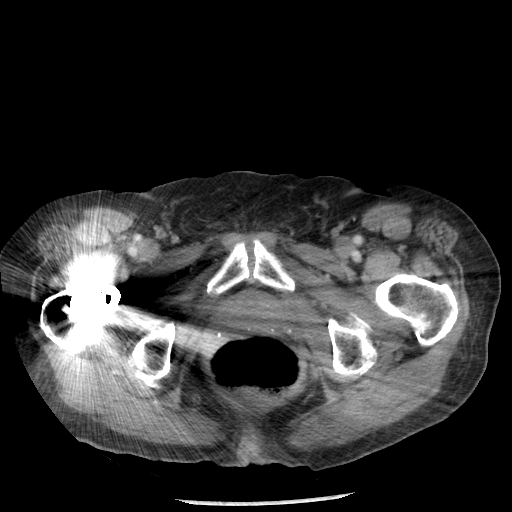
[im 11/86  bone]
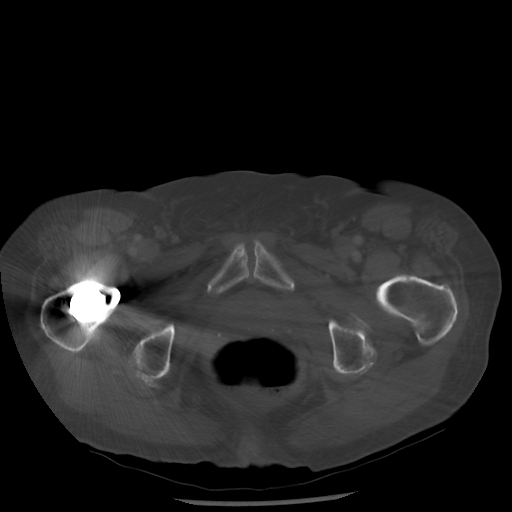
[im 22/86  soft-tissue]
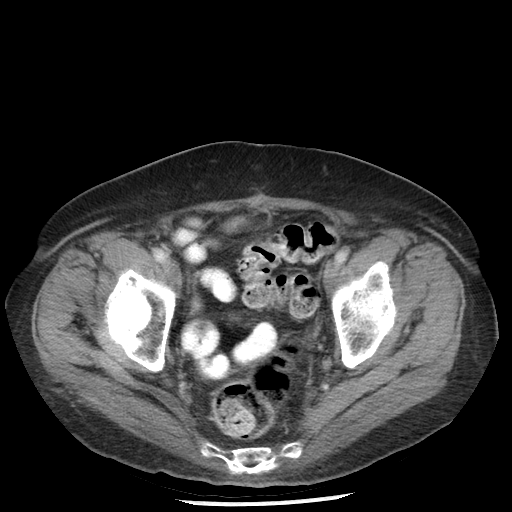
[im 32/86  soft-tissue]
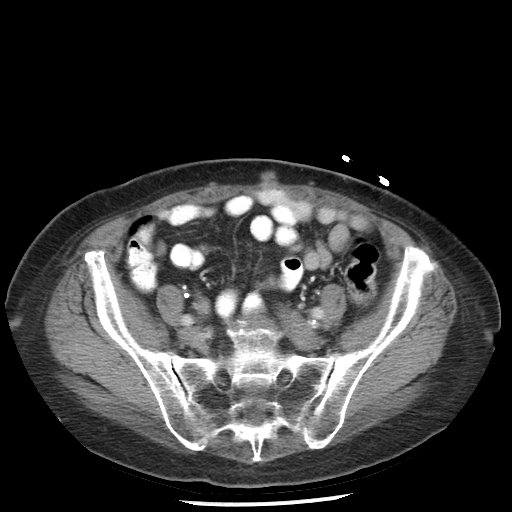
[im 43/86  soft-tissue]
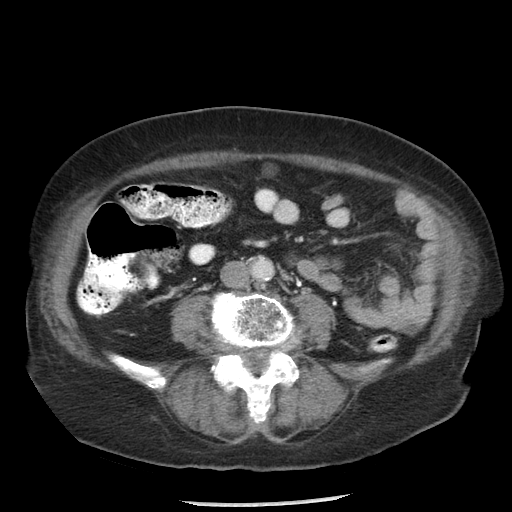
[im 43/86  lung]
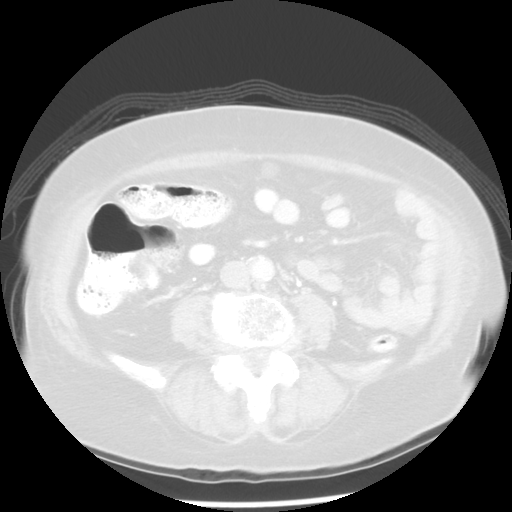
[im 54/86  soft-tissue]
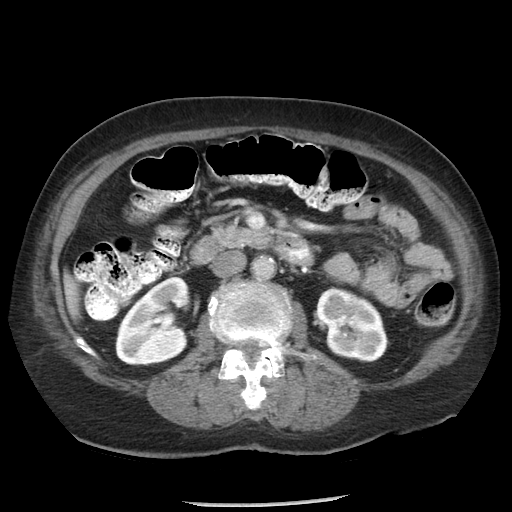
[im 54/86  lung]
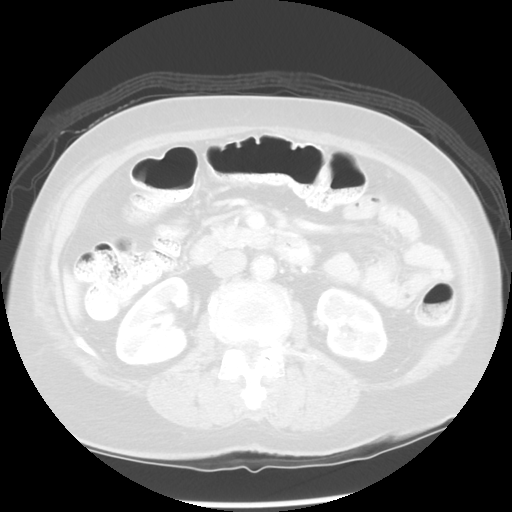
[im 64/86  soft-tissue]
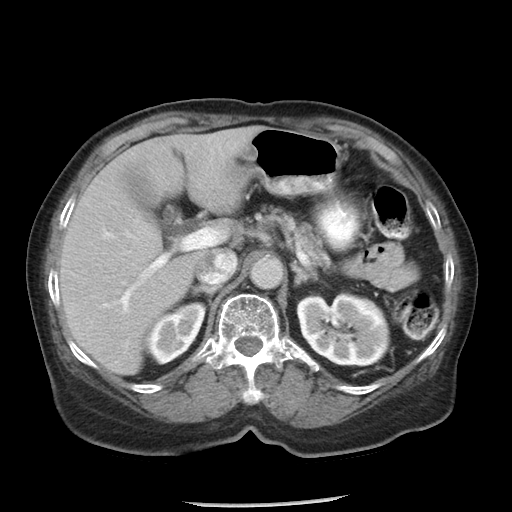
[im 64/86  lung]
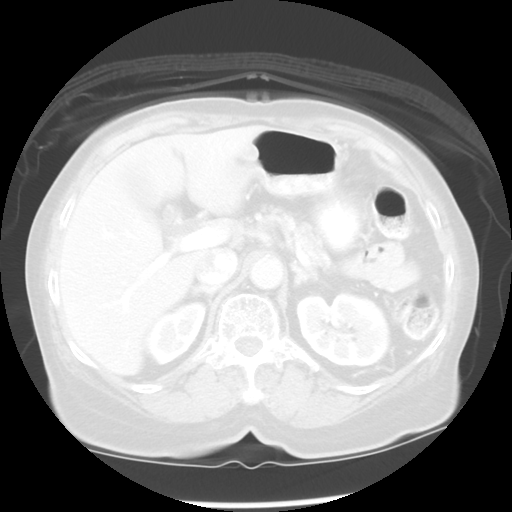
[im 75/86  soft-tissue]
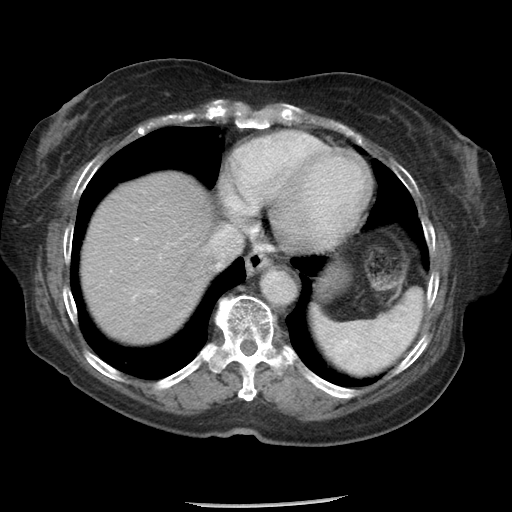
[im 75/86  lung]
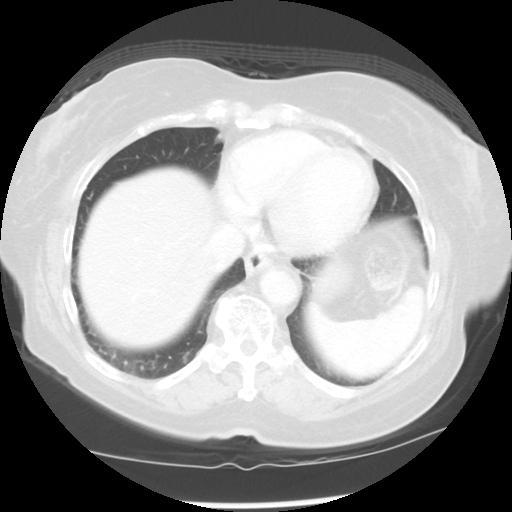

[Series 601: coronal body · coronal · 0.84mm/px · 1 of 125 slices shown, 2 images]
[im 42/125  soft-tissue]
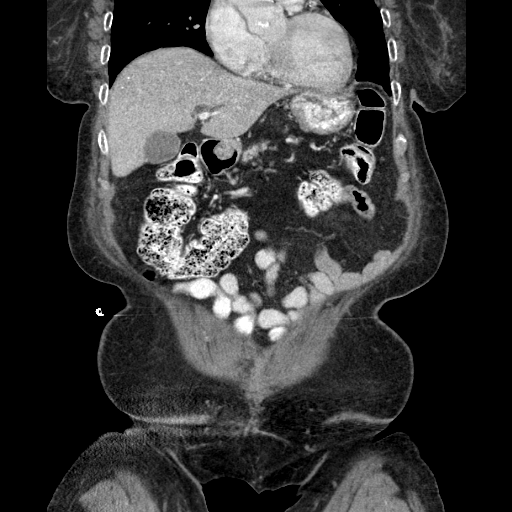
[im 42/125  bone]
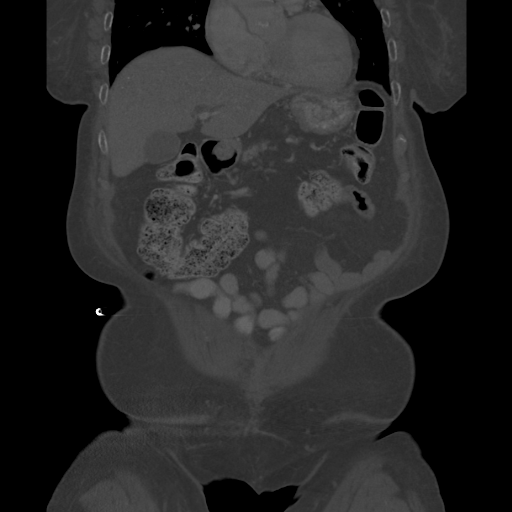

[Series 602: sagittal body · sagittal · 0.84mm/px · 4 of 145 slices shown]
[im 10/145  soft-tissue]
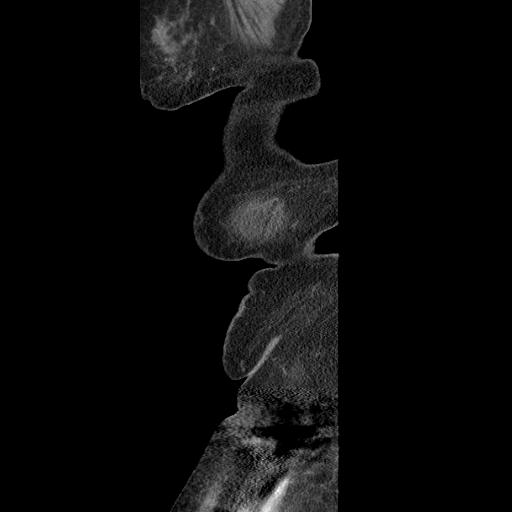
[im 28/145  soft-tissue]
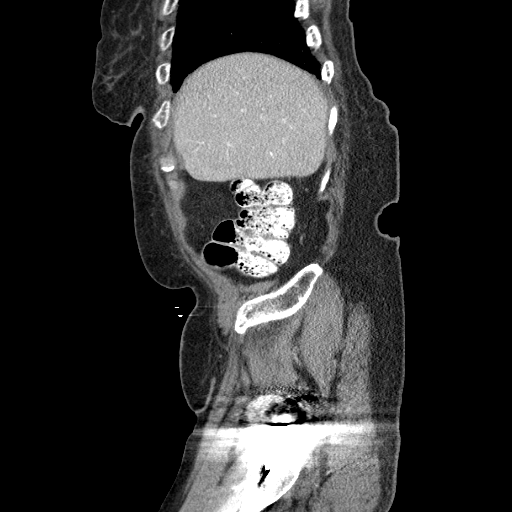
[im 46/145  soft-tissue]
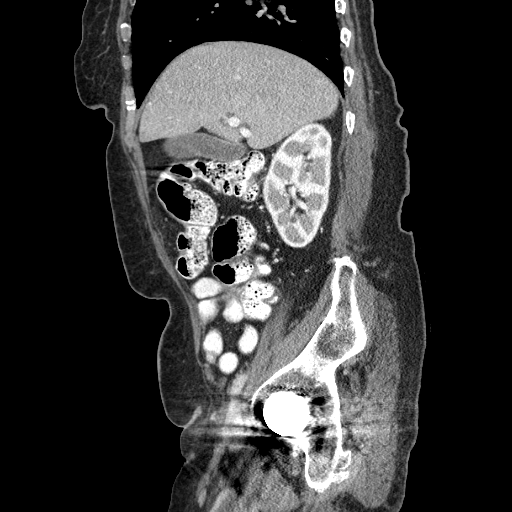
[im 64/145  soft-tissue]
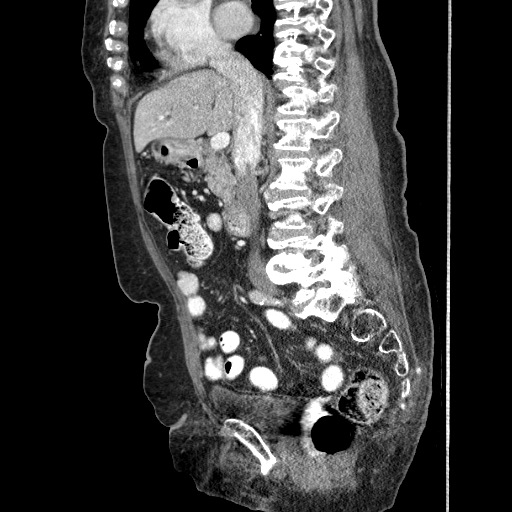

[12 of 36 positions shown; findings below may reference images not displayed]

FINDINGS: Visualization of the lower thorax demonstrates minimal dependent
atelectasis. Minimal amount of nonspecific soft tissue along the
posterior aspect of the right atrium (image 5; series 3).

Liver is normal in size and contour without focal hepatic lesion
identified. Gallbladder is unremarkable. Mild central intrahepatic
biliary ductal dilatation. No extrahepatic biliary ductal
dilatation. Portal vein is patent. The spleen, pancreas and
bilateral adrenal glands are unremarkable.

Kidneys enhance symmetrically with contrast. There is an 8 mm cyst
off the inferior pole of the left kidney. There is a 4 mm partially
exophytic low-attenuation lesion off the inferior pole of the right
kidney (image 38; series 3), too small to accurately characterize.

Normal caliber abdominal aorta with scattered calcified
atherosclerotic plaque. The pelvis is largely obscured from streak
artifact secondary to right hip arthroplasty hardware. There is a
large amount of stool within the rectum. No abnormal bowel wall
thickening. No evidence for bowel obstruction. Small hiatal hernia.

There are multiple soft tissue masses within the omentum which are
low in attenuation. The largest of these measures 1.2 cm (image 45;
series 3). Additionally adjacent to the sigmoid colon (image 63;
series 3) there is a 1.6 cm soft tissue mass.

No aggressive or acute appearing osseous lesions. Multilevel
degenerative change of lower thoracic and lumbar spine.
IMPRESSION: Multiple nonspecific low-attenuation masses within the greater
omentum in the anterior aspect of the abdomen. Additionally there is
a soft tissue mass adjacent to the sigmoid colon. While these may
potentially be sequelae of an infectious or inflammatory process.
Malignant etiology is not excluded. This can be further evaluated
with PET CT as clinically indicated.

Minimal amount of nonspecific soft tissue along the posterior aspect
of the right atrium. This could represent a mildly prominent
mediastinal node.

These results will be called to the ordering clinician or
representative by the Radiologist Assistant, and communication
documented in the PACS or zVision Dashboard.

## 2016-03-22 ENCOUNTER — Telehealth: Payer: Self-pay | Admitting: Internal Medicine

## 2016-03-22 NOTE — Telephone Encounter (Signed)
Pt is calling because her pharmacy is having some issues with the potassium prescription. It had recently changed to higher dose and the insurance company has some questions. Please call the pharmacy to discuss the exact issue. j w

## 2016-03-23 ENCOUNTER — Telehealth: Payer: Self-pay | Admitting: *Deleted

## 2016-03-23 DIAGNOSIS — E876 Hypokalemia: Secondary | ICD-10-CM

## 2016-03-23 MED ORDER — POTASSIUM CHLORIDE CRYS ER 20 MEQ PO TBCR: 20.0000 meq | EXTENDED_RELEASE_TABLET | Freq: Two times a day (BID) | ORAL | Status: DC

## 2016-03-23 NOTE — Telephone Encounter (Signed)
Pharmacy did not receive Rx for potassium.  Approved on 03/09/16, but stated NO PRINT. Rx resent electronically.  Derl Barrow, RN

## 2016-03-30 ENCOUNTER — Other Ambulatory Visit: Payer: Self-pay | Admitting: *Deleted

## 2016-03-30 MED ORDER — MOMETASONE FUROATE 50 MCG/ACT NA SUSP: 2.0000 | Freq: Every day | NASAL | Status: DC

## 2016-06-22 DIAGNOSIS — K9089 Other intestinal malabsorption: Secondary | ICD-10-CM | POA: Diagnosis not present

## 2016-06-22 DIAGNOSIS — E559 Vitamin D deficiency, unspecified: Secondary | ICD-10-CM | POA: Diagnosis not present

## 2016-06-29 ENCOUNTER — Other Ambulatory Visit: Payer: Self-pay | Admitting: *Deleted

## 2016-06-29 ENCOUNTER — Encounter: Payer: Self-pay | Admitting: *Deleted

## 2016-06-29 ENCOUNTER — Ambulatory Visit (INDEPENDENT_AMBULATORY_CARE_PROVIDER_SITE_OTHER): Payer: Medicare Other | Admitting: *Deleted

## 2016-06-29 VITALS — BP 174/72 | HR 56 | Temp 97.7°F | Ht 63.0 in | Wt 115.2 lb

## 2016-06-29 DIAGNOSIS — Z0000 Encounter for general adult medical examination without abnormal findings: Secondary | ICD-10-CM | POA: Diagnosis not present

## 2016-06-29 DIAGNOSIS — Z23 Encounter for immunization: Secondary | ICD-10-CM

## 2016-06-29 MED ORDER — DICLOFENAC SODIUM 1 % TD GEL: 2.0000 g | Freq: Four times a day (QID) | TRANSDERMAL | 0 refills | Status: AC

## 2016-06-29 MED ORDER — MOMETASONE FUROATE 50 MCG/ACT NA SUSP: 2.0000 | Freq: Every day | NASAL | 12 refills | Status: AC

## 2016-06-29 NOTE — Progress Notes (Signed)
Subjective:   Linda Macias is a 82 y.o. female who presents for an Initial Medicare Annual Wellness Visit.   Cardiac Risk Factors include: advanced age (>19men, >60 women);family history of premature cardiovascular disease;hypertension     Objective:    Today's Vitals   06/29/16 1615 06/29/16 1639  BP: (!) 169/73 (!) 190/84  Pulse: (!) 56   Temp: 97.7 F (36.5 C)   TempSrc: Oral   SpO2: 100%   Weight: 115 lb 3.2 oz (52.3 kg)   Height: 5\' 3"  (1.6 m)    Body mass index is 20.41 kg/m.   Current Medications (verified) Outpatient Encounter Prescriptions as of 06/29/2016  Medication Sig  . diclofenac sodium (VOLTAREN) 1 % GEL Apply 2 g topically 4 (four) times daily.  . ferrous sulfate 325 (65 FE) MG EC tablet Take 1 tablet (325 mg total) by mouth 2 (two) times daily.  Marland Kitchen loperamide (IMODIUM) 2 MG capsule Take 2 mg by mouth 3 (three) times daily as needed for diarrhea or loose stools.  . mometasone (NASONEX) 50 MCG/ACT nasal spray Place 2 sprays into the nose daily.  . Multiple Vitamins-Minerals (MULTI ADULT GUMMIES) CHEW Chew 2 tablets by mouth daily.  . potassium chloride SA (K-DUR,KLOR-CON) 20 MEQ tablet Take 1 tablet (20 mEq total) by mouth 2 (two) times daily.   No facility-administered encounter medications on file as of 06/29/2016.     Allergies (verified) Ciprofloxacin   History: Past Medical History:  Diagnosis Date  . Arthritis   . CVA (cerebral infarction)    No residuals, cannot tolerate statins   . Hyperlipidemia   . Hypertension   . Osteoarthritis, knee/lower leg    Past Surgical History:  Procedure Laterality Date  . TOTAL HIP ARTHROPLASTY  10/10   Family History  Problem Relation Age of Onset  . Cancer Mother   . Heart disease Mother   . Heart disease Father   . Cancer Sister     esophagus  . Cancer Sister     colon  . Cancer Sister     abdomin   Social History   Occupational History  . Retired- Medical illustrator Retired   Social  History Main Topics  . Smoking status: Never Smoker  . Smokeless tobacco: Never Used  . Alcohol use No  . Drug use: No  . Sexual activity: Not on file    Tobacco Counseling Counseling given: Yes   Activities of Daily Living In your present state of health, do you have any difficulty performing the following activities: 06/29/2016  Hearing? Y  Vision? N  Difficulty concentrating or making decisions? N  Walking or climbing stairs? Y  Dressing or bathing? N  Doing errands, shopping? Y  Preparing Food and eating ? N  Using the Toilet? N  In the past six months, have you accidently leaked urine? N  Do you have problems with loss of bowel control? Y  Managing your Medications? N  Managing your Finances? N  Housekeeping or managing your Housekeeping? N  Some recent data might be hidden   Home Safety:  My home has a working smoke alarm:  Yes X 3-4           My home throw rugs have been fastened down to the floor or removed:  Removed I have non-slip mats in the bathtub and shower:  Yes         All my home's stairs have railings or bannisters: two level home with railings.  Patient is set up on bottom floor         My home's floors, stairs and hallways are free from clutter, wires and cords:  Yes       Immunizations and Health Maintenance Immunization History  Administered Date(s) Administered  . Pneumococcal Polysaccharide-23 08/01/2000  . Td 08/01/2000, 08/21/2010   Health Maintenance Due  Topic Date Due  . ZOSTAVAX  10/23/1985  . PNA vac Low Risk Adult (2 of 2 - PCV13) 08/01/2001  . INFLUENZA VACCINE  06/01/2016  Patient refuses zostavax and flu vaccines Prevnar 13 given today  Patient Care Team: Asiyah Cletis Media, MD as PCP - General Ronald Lobo, MD as Consulting Physician (Gastroenterology)  Indicate any recent Medical Services you may have received from other than [facility] providers in the past year (date may be approximate).     Assessment:   This is a routine  wellness examination for Linda Macias.   Hearing/Vision screen  Hearing Screening   Method: Audiometry   125Hz  250Hz  500Hz  1000Hz  2000Hz  3000Hz  4000Hz  6000Hz  8000Hz   Right ear:   Fail Fail Fail  Fail    Left ear:   Fail Fail Fail  Fail    Comments: Wears left hearing aid   Dietary issues and exercise activities discussed: Current Exercise Habits: Home exercise routine, Type of exercise: walking;stretching, Time (Minutes): 30, Frequency (Times/Week): 5, Weekly Exercise (Minutes/Week): 150, Intensity: Mild, Exercise limited by: Other - see comments (Patient age , walks with cane or rolling walker)  Goals    . Blood Pressure < 150/90    . Reduce fried foods to 1-2x a week.     . Walk 15-20 minutes 5 times a week.       Depression Screen PHQ 2/9 Scores 06/29/2016 07/04/2015 06/20/2015 04/24/2015 11/07/2014 10/09/2014 10/04/2013  PHQ - 2 Score 0 0 0 0 0 0 0    Fall Risk Fall Risk  06/29/2016 02/09/2016 01/22/2016 07/04/2015 04/24/2015  Falls in the past year? No No No No No  Number falls in past yr: - - - - -  Injury with Fall? - - - - -    Cognitive Function: MMSE - Mini Mental State Exam 06/01/2011  Orientation to time 5  Orientation to Place 5  Registration 3  Attention/ Calculation 5  Recall 2  Language- name 2 objects 2  Language- repeat 1  Language- follow 3 step command 3  Language- read & follow direction 1  Write a sentence 1  Copy design 1  Total score 29   Mini-Cog passed with score 5/5  TUG Test:  Done in 34 seconds. Patient used both hands to push out of chair and to sit back down. Walked slowly using rolling walker with brakes appropriately.   Screening Tests Health Maintenance  Topic Date Due  . ZOSTAVAX  10/23/1985  . PNA vac Low Risk Adult (2 of 2 - PCV13) 08/01/2001  . INFLUENZA VACCINE  06/01/2016  . TETANUS/TDAP  08/21/2020  . DEXA SCAN  Completed      Plan:     During the course of the visit, Linda Macias was educated and counseled about the following appropriate  screening and preventive services:   Vaccines to include Pneumoccal, Influenza, Td, Zostavax  Cardiovascular disease screening  Colorectal cancer screening  Bone density screening  Diabetes screening  Mammography/PAP  Patient Instructions (the written plan) were given to the patient.    Velora Heckler, RN   06/29/2016

## 2016-06-29 NOTE — Patient Instructions (Signed)
 Fall Prevention in the Home  Falls can cause injuries. They can happen to people of all ages. There are many things you can do to make your home safe and to help prevent falls.  WHAT CAN I DO ON THE OUTSIDE OF MY HOME?  Regularly fix the edges of walkways and driveways and fix any cracks.  Remove anything that might make you trip as you walk through a door, such as a raised step or threshold.  Trim any bushes or trees on the path to your home.  Use bright outdoor lighting.  Clear any walking paths of anything that might make someone trip, such as rocks or tools.  Regularly check to see if handrails are loose or broken. Make sure that both sides of any steps have handrails.  Any raised decks and porches should have guardrails on the edges.  Have any leaves, snow, or ice cleared regularly.  Use sand or salt on walking paths during winter.  Clean up any spills in your garage right away. This includes oil or grease spills. WHAT CAN I DO IN THE BATHROOM?   Use night lights.  Install grab bars by the toilet and in the tub and shower. Do not use towel bars as grab bars.  Use non-skid mats or decals in the tub or shower.  If you need to sit down in the shower, use a plastic, non-slip stool.  Keep the floor dry. Clean up any water that spills on the floor as soon as it happens.  Remove soap buildup in the tub or shower regularly.  Attach bath mats securely with double-sided non-slip rug tape.  Do not have throw rugs and other things on the floor that can make you trip. WHAT CAN I DO IN THE BEDROOM?  Use night lights.  Make sure that you have a light by your bed that is easy to reach.  Do not use any sheets or blankets that are too big for your bed. They should not hang down onto the floor.  Have a firm chair that has side arms. You can use this for support while you get dressed.  Do not have throw rugs and other things on the floor that can make you trip. WHAT CAN I DO  IN THE KITCHEN?  Clean up any spills right away.  Avoid walking on wet floors.  Keep items that you use a lot in easy-to-reach places.  If you need to reach something above you, use a strong step stool that has a grab bar.  Keep electrical cords out of the way.  Do not use floor polish or wax that makes floors slippery. If you must use wax, use non-skid floor wax.  Do not have throw rugs and other things on the floor that can make you trip. WHAT CAN I DO WITH MY STAIRS?  Do not leave any items on the stairs.  Make sure that there are handrails on both sides of the stairs and use them. Fix handrails that are broken or loose. Make sure that handrails are as long as the stairways.  Check any carpeting to make sure that it is firmly attached to the stairs. Fix any carpet that is loose or worn.  Avoid having throw rugs at the top or bottom of the stairs. If you do have throw rugs, attach them to the floor with carpet tape.  Make sure that you have a light switch at the top of the stairs and the bottom of the   stairs. If you do not have them, ask someone to add them for you. WHAT ELSE CAN I DO TO HELP PREVENT FALLS?  Wear shoes that:  Do not have high heels.  Have rubber bottoms.  Are comfortable and fit you well.  Are closed at the toe. Do not wear sandals.  If you use a stepladder:  Make sure that it is fully opened. Do not climb a closed stepladder.  Make sure that both sides of the stepladder are locked into place.  Ask someone to hold it for you, if possible.  Clearly mark and make sure that you can see:  Any grab bars or handrails.  First and last steps.  Where the edge of each step is.  Use tools that help you move around (mobility aids) if they are needed. These include:  Canes.  Walkers.  Scooters.  Crutches.  Turn on the lights when you go into a dark area. Replace any light bulbs as soon as they burn out.  Set up your furniture so you have a clear  path. Avoid moving your furniture around.  If any of your floors are uneven, fix them.  If there are any pets around you, be aware of where they are.  Review your medicines with your doctor. Some medicines can make you feel dizzy. This can increase your chance of falling. Ask your doctor what other things that you can do to help prevent falls.   This information is not intended to replace advice given to you by your health care provider. Make sure you discuss any questions you have with your health care provider.   Document Released: 08/14/2009 Document Revised: 03/04/2015 Document Reviewed: 11/22/2014 Elsevier Interactive Patient Education 2016 [employer]  Health Maintenance, Female Adopting a healthy lifestyle and getting preventive care can go a long way to promote health and wellness. Talk with your health care provider about what schedule of regular examinations is right for you. This is a good chance for you to check in with your provider about disease prevention and staying healthy. In between checkups, there are plenty of things you can do on your own. Experts have done a lot of research about which lifestyle changes and preventive measures are most likely to keep you healthy. Ask your health care provider for more information. WEIGHT AND DIET  Eat a healthy diet  Be sure to include plenty of vegetables, fruits, low-fat dairy products, and lean protein.  Do not eat a lot of foods high in solid fats, added sugars, or salt.  Get regular exercise. This is one of the most important things you can do for your health.  Most adults should exercise for at least 150 minutes each week. The exercise should increase your heart rate and make you sweat (moderate-intensity exercise).  Most adults should also do strengthening exercises at least twice a week. This is in addition to the moderate-intensity exercise.  Maintain a healthy weight  Body mass index (BMI) is a measurement that can  be used to identify possible weight problems. It estimates body fat based on height and weight. Your health care provider can help determine your BMI and help you achieve or maintain a healthy weight.  For females 20 years of age and older:   A BMI below 18.5 is considered underweight.  A BMI of 18.5 to 24.9 is normal.  A BMI of 25 to 29.9 is considered overweight.  A BMI of 30 and above is considered obese.  Watch levels   of cholesterol and blood lipids  You should start having your blood tested for lipids and cholesterol at 80 years of age, then have this test every 5 years.  You may need to have your cholesterol levels checked more often if:  Your lipid or cholesterol levels are high.  You are older than 80 years of age.  You are at high risk for heart disease.  CANCER SCREENING   Lung Cancer  Lung cancer screening is recommended for adults 53-62 years old who are at high risk for lung cancer because of a history of smoking.  A yearly low-dose CT scan of the lungs is recommended for people who:  Currently smoke.  Have quit within the past 15 years.  Have at least a 30-pack-year history of smoking. A pack year is smoking an average of one pack of cigarettes a day for 1 year.  Yearly screening should continue until it has been 15 years since you quit.  Yearly screening should stop if you develop a health problem that would prevent you from having lung cancer treatment.  Breast Cancer  Practice breast self-awareness. This means understanding how your breasts normally appear and feel.  It also means doing regular breast self-exams. Let your health care provider know about any changes, no matter how small.  If you are in your 20s or 30s, you should have a clinical breast exam (CBE) by a health care provider every 1-3 years as part of a regular health exam.  If you are 4 or older, have a CBE every year. Also consider having a breast X-ray (mammogram) every year.  If  you have a family history of breast cancer, talk to your health care provider about genetic screening.  If you are at high risk for breast cancer, talk to your health care provider about having an MRI and a mammogram every year.  Breast cancer gene (BRCA) assessment is recommended for women who have family members with BRCA-related cancers. BRCA-related cancers include:  Breast.  Ovarian.  Tubal.  Peritoneal cancers.  Results of the assessment will determine the need for genetic counseling and BRCA1 and BRCA2 testing. Cervical Cancer Your health care provider may recommend that you be screened regularly for cancer of the pelvic organs (ovaries, uterus, and vagina). This screening involves a pelvic examination, including checking for microscopic changes to the surface of your cervix (Pap test). You may be encouraged to have this screening done every 3 years, beginning at age 75.  For women ages 5-65, health care providers may recommend pelvic exams and Pap testing every 3 years, or they may recommend the Pap and pelvic exam, combined with testing for human papilloma virus (HPV), every 5 years. Some types of HPV increase your risk of cervical cancer. Testing for HPV may also be done on women of any age with unclear Pap test results.  Other health care providers may not recommend any screening for nonpregnant women who are considered low risk for pelvic cancer and who do not have symptoms. Ask your health care provider if a screening pelvic exam is right for you.  If you have had past treatment for cervical cancer or a condition that could lead to cancer, you need Pap tests and screening for cancer for at least 20 years after your treatment. If Pap tests have been discontinued, your risk factors (such as having a new sexual partner) need to be reassessed to determine if screening should resume. Some women have medical problems that increase the chance  of getting cervical cancer. In these cases,  your health care provider may recommend more frequent screening and Pap tests. Colorectal Cancer  This type of cancer can be detected and often prevented.  Routine colorectal cancer screening usually begins at 80 years of age and continues through 80 years of age.  Your health care provider may recommend screening at an earlier age if you have risk factors for colon cancer.  Your health care provider may also recommend using home test kits to check for hidden blood in the stool.  A small camera at the end of a tube can be used to examine your colon directly (sigmoidoscopy or colonoscopy). This is done to check for the earliest forms of colorectal cancer.  Routine screening usually begins at age 50.  Direct examination of the colon should be repeated every 5-10 years through 80 years of age. However, you may need to be screened more often if early forms of precancerous polyps or small growths are found. Skin Cancer  Check your skin from head to toe regularly.  Tell your health care provider about any new moles or changes in moles, especially if there is a change in a mole's shape or color.  Also tell your health care provider if you have a mole that is larger than the size of a pencil eraser.  Always use sunscreen. Apply sunscreen liberally and repeatedly throughout the day.  Protect yourself by wearing long sleeves, pants, a wide-brimmed hat, and sunglasses whenever you are outside. HEART DISEASE, DIABETES, AND HIGH BLOOD PRESSURE   High blood pressure causes heart disease and increases the risk of stroke. High blood pressure is more likely to develop in:  People who have blood pressure in the high end of the normal range (130-139/85-89 mm Hg).  People who are overweight or obese.  People who are African American.  If you are 18-39 years of age, have your blood pressure checked every 3-5 years. If you are 40 years of age or older, have your blood pressure checked every year. You  should have your blood pressure measured twice--once when you are at a hospital or clinic, and once when you are not at a hospital or clinic. Record the average of the two measurements. To check your blood pressure when you are not at a hospital or clinic, you can use:  An automated blood pressure machine at a pharmacy.  A home blood pressure monitor.  If you are between 55 years and 79 years old, ask your health care provider if you should take aspirin to prevent strokes.  Have regular diabetes screenings. This involves taking a blood sample to check your fasting blood sugar level.  If you are at a normal weight and have a low risk for diabetes, have this test once every three years after 80 years of age.  If you are overweight and have a high risk for diabetes, consider being tested at a younger age or more often. PREVENTING INFECTION  Hepatitis B  If you have a higher risk for hepatitis B, you should be screened for this virus. You are considered at high risk for hepatitis B if:  You were born in a country where hepatitis B is common. Ask your health care provider which countries are considered high risk.  Your parents were born in a high-risk country, and you have not been immunized against hepatitis B (hepatitis B vaccine).  You have HIV or AIDS.  You use needles to inject street drugs.  You   live with someone who has hepatitis B.  You have had sex with someone who has hepatitis B.  You get hemodialysis treatment.  You take certain medicines for conditions, including cancer, organ transplantation, and autoimmune conditions. Hepatitis C  Blood testing is recommended for:  Everyone born from 1945 through 1965.  Anyone with known risk factors for hepatitis C. Sexually transmitted infections (STIs)  You should be screened for sexually transmitted infections (STIs) including gonorrhea and chlamydia if:  You are sexually active and are younger than 80 years of age.  You  are older than 80 years of age and your health care provider tells you that you are at risk for this type of infection.  Your sexual activity has changed since you were last screened and you are at an increased risk for chlamydia or gonorrhea. Ask your health care provider if you are at risk.  If you do not have HIV, but are at risk, it may be recommended that you take a prescription medicine daily to prevent HIV infection. This is called pre-exposure prophylaxis (PrEP). You are considered at risk if:  You are sexually active and do not regularly use condoms or know the HIV status of your partner(s).  You take drugs by injection.  You are sexually active with a partner who has HIV. Talk with your health care provider about whether you are at high risk of being infected with HIV. If you choose to begin PrEP, you should first be tested for HIV. You should then be tested every 3 months for as long as you are taking PrEP.  PREGNANCY   If you are premenopausal and you may become pregnant, ask your health care provider about preconception counseling.  If you may become pregnant, take 400 to 800 micrograms (mcg) of folic acid every day.  If you want to prevent pregnancy, talk to your health care provider about birth control (contraception). OSTEOPOROSIS AND MENOPAUSE   Osteoporosis is a disease in which the bones lose minerals and strength with aging. This can result in serious bone fractures. Your risk for osteoporosis can be identified using a bone density scan.  If you are 65 years of age or older, or if you are at risk for osteoporosis and fractures, ask your health care provider if you should be screened.  Ask your health care provider whether you should take a calcium or vitamin D supplement to lower your risk for osteoporosis.  Menopause may have certain physical symptoms and risks.  Hormone replacement therapy may reduce some of these symptoms and risks. Talk to your health care  provider about whether hormone replacement therapy is right for you.  HOME CARE INSTRUCTIONS   Schedule regular health, dental, and eye exams.  Stay current with your immunizations.   Do not use any tobacco products including cigarettes, chewing tobacco, or electronic cigarettes.  If you are pregnant, do not drink alcohol.  If you are breastfeeding, limit how much and how often you drink alcohol.  Limit alcohol intake to no more than 1 drink per day for nonpregnant women. One drink equals 12 ounces of beer, 5 ounces of wine, or 1 ounces of hard liquor.  Do not use street drugs.  Do not share needles.  Ask your health care provider for help if you need support or information about quitting drugs.  Tell your health care provider if you often feel depressed.  Tell your health care provider if you have ever been abused or do not feel safe   at home.   This information is not intended to replace advice given to you by your health care provider. Make sure you discuss any questions you have with your health care provider.   Document Released: 05/03/2011 Document Revised: 11/08/2014 Document Reviewed: 09/19/2013 Elsevier Interactive Patient Education 2016 [employer]  

## 2016-07-13 ENCOUNTER — Telehealth: Payer: Self-pay | Admitting: Internal Medicine

## 2016-07-13 NOTE — Telephone Encounter (Signed)
Returned daughter's call at [phone], however, VM picked up. Message left for daughter to call back this afternoon. Velora Heckler, RN

## 2016-07-13 NOTE — Telephone Encounter (Signed)
Daughter calls stating that she would like to speak to Ander Purpura, who saw pt for wellness visit on 8/29. Would like to speak with her about concerns that was addressed during the [facility] but have not been followed up one. Please call Sonya at [phone],

## 2016-07-13 NOTE — Telephone Encounter (Signed)
Spoke with patient's daughter, Lujean Rave, who states patient has been having abdominal pain, decreased appetite, and  regurgitation after eating for several days. Discussed with Preceptor, Dr. Ree Kida, and daughter advised to take patient to ED to be evaluated. Lujean Rave states she will. Also advised patient be seen by PCP for elevated BP noted at [facility] on 06/29/16 DUCATTE, Orvis Brill, RN

## 2016-07-14 ENCOUNTER — Encounter (HOSPITAL_COMMUNITY): Payer: Self-pay

## 2016-07-14 ENCOUNTER — Emergency Department (HOSPITAL_COMMUNITY): Payer: Medicare Other

## 2016-07-14 ENCOUNTER — Inpatient Hospital Stay (HOSPITAL_COMMUNITY)
Admission: EM | Admit: 2016-07-14 | Discharge: 2016-07-25 | DRG: 381 | Disposition: A | Discharge status: Home / Self Care | Payer: Medicare Other | Attending: Family Medicine | Admitting: Family Medicine

## 2016-07-14 DIAGNOSIS — M179 Osteoarthritis of knee, unspecified: Secondary | ICD-10-CM | POA: Diagnosis present

## 2016-07-14 DIAGNOSIS — K56609 Unspecified intestinal obstruction, unspecified as to partial versus complete obstruction: Secondary | ICD-10-CM | POA: Diagnosis present

## 2016-07-14 DIAGNOSIS — K315 Obstruction of duodenum: Secondary | ICD-10-CM | POA: Diagnosis not present

## 2016-07-14 DIAGNOSIS — R109 Unspecified abdominal pain: Secondary | ICD-10-CM

## 2016-07-14 DIAGNOSIS — Z8673 Personal history of transient ischemic attack (TIA), and cerebral infarction without residual deficits: Secondary | ICD-10-CM

## 2016-07-14 DIAGNOSIS — R140 Abdominal distension (gaseous): Secondary | ICD-10-CM | POA: Diagnosis not present

## 2016-07-14 DIAGNOSIS — K311 Adult hypertrophic pyloric stenosis: Secondary | ICD-10-CM | POA: Diagnosis not present

## 2016-07-14 DIAGNOSIS — Z96641 Presence of right artificial hip joint: Secondary | ICD-10-CM | POA: Diagnosis present

## 2016-07-14 DIAGNOSIS — Z7951 Long term (current) use of inhaled steroids: Secondary | ICD-10-CM | POA: Diagnosis not present

## 2016-07-14 DIAGNOSIS — Z66 Do not resuscitate: Secondary | ICD-10-CM | POA: Diagnosis not present

## 2016-07-14 DIAGNOSIS — I10 Essential (primary) hypertension: Secondary | ICD-10-CM | POA: Diagnosis not present

## 2016-07-14 DIAGNOSIS — K210 Gastro-esophageal reflux disease with esophagitis: Secondary | ICD-10-CM | POA: Diagnosis present

## 2016-07-14 DIAGNOSIS — R1012 Left upper quadrant pain: Secondary | ICD-10-CM | POA: Diagnosis not present

## 2016-07-14 DIAGNOSIS — R64 Cachexia: Secondary | ICD-10-CM | POA: Diagnosis present

## 2016-07-14 DIAGNOSIS — E440 Moderate protein-calorie malnutrition: Secondary | ICD-10-CM | POA: Diagnosis not present

## 2016-07-14 DIAGNOSIS — R188 Other ascites: Secondary | ICD-10-CM | POA: Diagnosis not present

## 2016-07-14 DIAGNOSIS — Z6820 Body mass index (BMI) 20.0-20.9, adult: Secondary | ICD-10-CM

## 2016-07-14 DIAGNOSIS — E876 Hypokalemia: Secondary | ICD-10-CM | POA: Diagnosis present

## 2016-07-14 DIAGNOSIS — Z809 Family history of malignant neoplasm, unspecified: Secondary | ICD-10-CM

## 2016-07-14 DIAGNOSIS — T189XXA Foreign body of alimentary tract, part unspecified, initial encounter: Secondary | ICD-10-CM

## 2016-07-14 DIAGNOSIS — D509 Iron deficiency anemia, unspecified: Secondary | ICD-10-CM | POA: Diagnosis present

## 2016-07-14 DIAGNOSIS — K2980 Duodenitis without bleeding: Secondary | ICD-10-CM | POA: Diagnosis not present

## 2016-07-14 DIAGNOSIS — R197 Diarrhea, unspecified: Secondary | ICD-10-CM

## 2016-07-14 DIAGNOSIS — K6389 Other specified diseases of intestine: Secondary | ICD-10-CM

## 2016-07-14 DIAGNOSIS — Z8249 Family history of ischemic heart disease and other diseases of the circulatory system: Secondary | ICD-10-CM | POA: Diagnosis not present

## 2016-07-14 DIAGNOSIS — E785 Hyperlipidemia, unspecified: Secondary | ICD-10-CM | POA: Diagnosis present

## 2016-07-14 DIAGNOSIS — K3189 Other diseases of stomach and duodenum: Secondary | ICD-10-CM | POA: Diagnosis present

## 2016-07-14 HISTORY — DX: Malignant (primary) neoplasm, unspecified: C80.1

## 2016-07-14 LAB — CBC
HCT: 29.9 % — ABNORMAL LOW (ref 36.0–46.0)
Hemoglobin: 9.6 g/dL — ABNORMAL LOW (ref 12.0–15.0)
MCH: 24.2 pg — ABNORMAL LOW (ref 26.0–34.0)
MCHC: 32.1 g/dL (ref 30.0–36.0)
MCV: 75.5 fL — ABNORMAL LOW (ref 78.0–100.0)
Platelets: 132 10*3/uL — ABNORMAL LOW (ref 150–400)
RBC: 3.96 MIL/uL (ref 3.87–5.11)
RDW: 14.3 % (ref 11.5–15.5)
WBC: 5.6 10*3/uL (ref 4.0–10.5)

## 2016-07-14 LAB — URINALYSIS, ROUTINE W REFLEX MICROSCOPIC
Bilirubin Urine: NEGATIVE
Glucose, UA: NEGATIVE mg/dL
Ketones, ur: 15 mg/dL — AB
Nitrite: POSITIVE — AB
Protein, ur: 30 mg/dL — AB
Specific Gravity, Urine: 1.022 (ref 1.005–1.030)
pH: 5 (ref 5.0–8.0)

## 2016-07-14 LAB — COMPREHENSIVE METABOLIC PANEL
ALT: 10 U/L — ABNORMAL LOW (ref 14–54)
AST: 16 U/L (ref 15–41)
Albumin: 3.8 g/dL (ref 3.5–5.0)
Alkaline Phosphatase: 37 U/L — ABNORMAL LOW (ref 38–126)
Anion gap: 8 (ref 5–15)
BUN: 17 mg/dL (ref 6–20)
CO2: 22 mmol/L (ref 22–32)
Calcium: 9.2 mg/dL (ref 8.9–10.3)
Chloride: 107 mmol/L (ref 101–111)
Creatinine, Ser: 0.98 mg/dL (ref 0.44–1.00)
GFR calc Af Amer: 57 mL/min — ABNORMAL LOW (ref >60)
GFR calc non Af Amer: 49 mL/min — ABNORMAL LOW (ref >60)
Glucose, Bld: 86 mg/dL (ref 65–99)
Potassium: 3.8 mmol/L (ref 3.5–5.1)
Sodium: 137 mmol/L (ref 135–145)
Total Bilirubin: 0.7 mg/dL (ref 0.3–1.2)
Total Protein: 6.3 g/dL — ABNORMAL LOW (ref 6.5–8.1)

## 2016-07-14 LAB — URINE MICROSCOPIC-ADD ON

## 2016-07-14 LAB — LIPASE, BLOOD: Lipase: 20 U/L (ref 11–51)

## 2016-07-14 LAB — I-STAT CG4 LACTIC ACID, ED: Lactic Acid, Venous: 1.09 mmol/L (ref 0.5–1.9)

## 2016-07-14 MED ORDER — ENOXAPARIN SODIUM 30 MG/0.3ML ~~LOC~~ SOLN
30.0000 mg | SUBCUTANEOUS | Status: DC
Start: 2016-07-14 — End: 2016-07-25
  Administered 2016-07-14 – 2016-07-24 (×11): 30 mg via SUBCUTANEOUS
  Filled 2016-07-14 (×10): qty 0.3

## 2016-07-14 MED ORDER — FLUTICASONE PROPIONATE 50 MCG/ACT NA SUSP
1.0000 | Freq: Every day | NASAL | Status: DC
  Administered 2016-07-15 – 2016-07-25 (×8): 1 via NASAL
  Filled 2016-07-14 (×2): qty 16

## 2016-07-14 MED ORDER — FAMOTIDINE IN NACL 20-0.9 MG/50ML-% IV SOLN
20.0000 mg | INTRAVENOUS | Status: DC
  Administered 2016-07-14 – 2016-07-21 (×8): 20 mg via INTRAVENOUS
  Filled 2016-07-14 (×9): qty 50

## 2016-07-14 MED ORDER — SODIUM CHLORIDE 0.9 % IV SOLN
INTRAVENOUS | Status: DC
  Administered 2016-07-14 – 2016-07-15 (×2): via INTRAVENOUS

## 2016-07-14 NOTE — H&P (Signed)
Linda Macias Admission History and Physical Service Pager: [phone]  Patient name: Linda Macias Medical record number: [id number] Date of birth: [birth year] Age: 82 y.o. Gender: female  Primary Care Provider: Lockie Pares, MD Consultants: GI (in AM)  Code Status: DNR  Chief Complaint: abdominal bloating, indigestion, post-prandial abdominal pain  Assessment and Plan: GENAVEE FIREBAUGH is a 82 y.o. female presenting with abdominal distention, nausea/vomiting, constipation progressively worsening for 2 months. PMH is significant for small intestinal bacterial overgrowth, chronic diarrhea, HTN (stable off meds), LV Diastolic Dysfunction, IBS, hx of TIA.   Concern for Bezoar vs Gastric Outlet Obstruction: Symptoms of abdominal bloating, gas, post-prandial abdominal pain, N/V for 2 months with worsening symptoms. Other causes to consider for possible gastric outlet obstruction PUD. Also consider malignancy. But patient has been gaining weight per chart review. With symptoms of post-prandial abdominal pain, considered mesenteric ischemia; patient had mesenteric doppler study in February 2016 which did not reveal any significant mesenteric arterial stenosis. Currently no abdominal pain or tenderness to palpation; having normal BMs. Patient's vitals are stable except for mild tachycardia. Her lab work is unremarkable with no leukocytosis. Lipase is wnl. Is followed by [facility] for small intestinal bacterial overgrowth. Per grand-daughter, has never had EGD.  - admit to med-surg, attending Dr. Gwendlyn Deutscher - NPO with NG tube placement; consider CT abdomen if NG tube does not relieve symptoms or if patient decompensates - IV Pepcid while NPO - IVF at maintenance (NS @ 92cc/hr) - GI consult in the AM ([facility])  HTN: Not on medications at home. Currently stable - will monitor  History of Small intestinal bacterial overgrowth: was diagnosed with this about 1 year ago and had  associated weight loss. Was on Xifaxan but currently on this. Her chronic diarrhea and weight improved despite being off of Xifaxan. Was seen by GI a few weeks ago by GI, and granddaughter reports patient was doing well per GI at that time. Currently having soft stools, no diarrhea.   History of Hypokalemia: on Kdur 76meq BID at home. Not on potassium wasting agents at home. Has history of chronic diarrhea,currently resolved. K 3.8 on admit.  - will monitor and repleat as needed.   Microcytic Anemia: hgb 9 on admit. Baseline ~8. No signs of bleeding. Last Colonoscopy in 07/2014 with two polyps with pathology of tubular adenoma and no high grade dysplasia. Per grand-daughter, patient has never had an EGD.  - monitor with labs  Left Ventricular Diastolic Dysfunction: ECHO 08/2015 with EF 55-60%, pseudonormal LV filling pattern with G2DD. Trace pitting edema up to knees. Lungs are clear to auscultation. No dyspnea or orthopnea.  - will monitor  FEN/GI: NPO with NG tube  Prophylaxis: Lovenox  Disposition: admit for further evaluation   History of Present Illness:  Linda Macias is a 82 y.o. female presenting with abdominal bloating, gas, indigestion, and constipation x 2 months.   Grand-daughter helped provide history. Patient reports of abdominal bloating, gas, indigestion, post-prandial abdominal pain that has been worsening for the past 2 months. She has not had a regular food for the fast week of post-prandial abdominal pain and nausea. She reports of having emesis with most meals; emesis is non-bloody/nonbilious and reports it is mostly white sputum and white frothy material. She reports she had nausea but "the food would not come up". She did however tolerate half a sandwhich and half a cup of ice cream last night without nausea but reports of abdominal pain  which is mainly in her left side. Grand-daughter reports she has been taking 1 tab of alca seltzer x 1 week which has been helping some  with her symptoms. She has been able to drink water but not as much as she usually does (9 glasses/day). Normal urine output. Reports of constipation 2 weeks ago for which she took Miralax once; since then she has been having soft BMs but not daily. Last BM was in the ED. No blood or dark tarry stools. Denies fevers, chills, chest pain, sob, lightheadedness, dizziness. Currently denies any abdominal pain; reports of just bloating.   Patient's daughter called the clinic yesterday to see if patient would be able to be seen in clinic. Daughter was told to bring patient to ED for further evaluation.   In the ED, patient's vitals were overall stable with mild tachycardia. CBC wnl. CMP wnl. Lipase 20. Lactic acid 1.09. Xray of abdomen with marked distention of stomach with large amount of debris which may reflect bezoar or gastric outlet obstruction.   Review Of Systems: Per HPI  Otherwise the remainder of the systems were negative.  Patient Active Problem List   Diagnosis Date Noted  . Bowel obstruction ([place]) 07/14/2016  . TIA (transient ischemic attack) 03/09/2016  . Left ventricular diastolic dysfunction, NYHA class 2 08/12/2015  . Chronic diarrhea   . Intestinal bacterial overgrowth   . Microcytic anemia 10/10/2014  . Hypokalemia 10/10/2014  . Hearing loss 01/07/2014  . Cervical spondylosis without myelopathy 10/18/2013  . Well woman exam 02/15/2012  . Carpal tunnel syndrome 06/30/2011  . HIP JOINT REPLACEMENT BY OTHER MEANS 06/23/2009  . DEGENERATIVE JOINT DISEASE 04/23/2008  . OSTEOARTHROSIS UNSPEC WHETHER GEN/LOCALIZED HAND 01/05/2008  . ARTHRITIS, CERVICAL SPINE 03/09/2007  . COLON POLYP 12/29/2006  . HYPERTENSION, BENIGN SYSTEMIC 12/29/2006    Past Medical History: Past Medical History:  Diagnosis Date  . Arthritis   . CVA (cerebral infarction)    No residuals, cannot tolerate statins   . Hyperlipidemia   . Hypertension   . Osteoarthritis, knee/lower leg     Past Surgical  History: Past Surgical History:  Procedure Laterality Date  . TOTAL HIP ARTHROPLASTY  10/10    Social History: Social History  Substance Use Topics  . Smoking status: Never Smoker  . Smokeless tobacco: Never Used  . Alcohol use No   Additional social history:  Please also refer to relevant sections of EMR.  Family History: Family History  Problem Relation Age of Onset  . Cancer Mother   . Heart disease Mother   . Heart disease Father   . Cancer Sister     esophagus  . Cancer Sister     colon  . Cancer Sister     abdomin   Allergies and Medications: Allergies  Allergen Reactions  . Ciprofloxacin Other (See Comments)    Diarrhea for 37 days  . Flagyl Er [Metronidazole]     Diarrhea    No current facility-administered medications on file prior to encounter.    Current Outpatient Prescriptions on File Prior to Encounter  Medication Sig Dispense Refill  . diclofenac sodium (VOLTAREN) 1 % GEL Apply 2 g topically 4 (four) times daily. 100 g 0  . ferrous sulfate 325 (65 FE) MG EC tablet Take 1 tablet (325 mg total) by mouth 2 (two) times daily. 30 tablet 3  . loperamide (IMODIUM) 2 MG capsule Take 2 mg by mouth 3 (three) times daily as needed for diarrhea or loose stools.    Marland Kitchen  mometasone (NASONEX) 50 MCG/ACT nasal spray Place 2 sprays into the nose daily. 17 g 12  . Multiple Vitamins-Minerals (MULTI ADULT GUMMIES) CHEW Chew 2 tablets by mouth daily.    . potassium chloride SA (K-DUR,KLOR-CON) 20 MEQ tablet Take 1 tablet (20 mEq total) by mouth 2 (two) times daily. 60 tablet 3    Objective: BP 110/76   Pulse 105   Temp 98 F (36.7 C) (Oral)   Resp 11   Ht 5\' 3"  (1.6 m)   Wt 52.2 kg (115 lb)   SpO2 100%   BMI 20.37 kg/m  Exam: GEN: NAD, thin  HEENT: Atraumatic, normocephalic, neck supple, EOMI, sclera clear,MMM  CV: Regular rhythm, mildly tachycardic, no murmurs, rubs, or gallops PULM: CTAB, normal effort ABD: Soft, distended mainly in the left upper quadrant,  nontender, + BS SKIN: No rash or cyanosis; warm and well-perfused EXTR: trace edema to knees bilaterally, no calf tenderness PSYCH: Mood and affect euthymic, normal rate and volume of speech NEURO: Awake, alert, no focal deficits grossly, normal speech   Labs and Imaging: CBC BMET   Recent Labs Lab 07/14/16 1213  WBC 5.6  HGB 9.6*  HCT 29.9*  PLT 132*    Recent Labs Lab 07/14/16 1213  NA 137  K 3.8  CL 107  CO2 22  BUN 17  CREATININE 0.98  GLUCOSE 86  CALCIUM 9.2    Lipase 22 Lactic Acid 1.09 EKG: NSR, ST or T wave changes  X-ray Abdomen: FINDINGS: The cardiomediastinal silhouette is unchanged and within normal limits. Chronic lung changes are again noted with interstitial coarsening in scattered areas of scarring. No pleural effusion or pneumothorax is identified.  There is no evidence of intraperitoneal free air. There is marked distention of the stomach, which contains a large amount of mottled material. Scattered, small air-fluid levels are noted in the bowel including in the descending colon, correlate for diarrheal illness. No bowel dilatation is suggestive of mechanical obstruction is seen. Prior right total hip arthroplasty. Lumbar spondylosis. Pelvic calcifications are similar to the prior study and likely represent phleboliths.  IMPRESSION: 1. Marked distention of the stomach with large amount of debris which may reflect a bezoar and/or gastric outlet obstruction. 2. Chronic lung changes without evidence of acute cardiopulmonary process.  Smiley Houseman, MD 07/14/2016, 7:19 PM PGY-2, [facility] pager: [phone], text pages welcome

## 2016-07-14 NOTE — ED Provider Notes (Signed)
[facility] DEPT Provider Note   CSN: [id number] Arrival date & time: 07/14/16  1143     History   Chief Complaint Chief Complaint  Patient presents with  . Abdominal Pain  . Bloated  . Emesis    HPI Linda Macias is a 82 y.o. female.  HPI Patient is a 82 year old female with past medical history of hypertension, chronic diarrhea and intestinal bacterial overgrowth who comes in today complaining of abdominal pain bloating and decreased stool.  Patient states she has had GI issues since 2015, was diagnosed with bacterial overgrowth and given appropriate antibiotics.  Patient states her chronic diarrhea resolved however since that time she has had worsening abdominal discomfort following meals.  Patient also complains of increased gas and bloating.  Patient states that last night she had half a chicken sandwich and some ice cream which resulted in 10 out of 10 pain to the left upper quadrant.  Patient currently denies any pain or discomfort.  Patient denies fevers chills.  Patient states that she has had some emesis after eating.   Past Medical History:  Diagnosis Date  . Arthritis   . CVA (cerebral infarction)    No residuals, cannot tolerate statins   . Hyperlipidemia   . Hypertension   . Osteoarthritis, knee/lower leg     Patient Active Problem List   Diagnosis Date Noted  . Bowel obstruction ([place]) 07/14/2016  . Abdominal distention 07/14/2016  . TIA (transient ischemic attack) 03/09/2016  . Left ventricular diastolic dysfunction, NYHA class 2 08/12/2015  . Chronic diarrhea   . Intestinal bacterial overgrowth   . Microcytic anemia 10/10/2014  . Hypokalemia 10/10/2014  . Hearing loss 01/07/2014  . Cervical spondylosis without myelopathy 10/18/2013  . Well woman exam 02/15/2012  . Carpal tunnel syndrome 06/30/2011  . HIP JOINT REPLACEMENT BY OTHER MEANS 06/23/2009  . DEGENERATIVE JOINT DISEASE 04/23/2008  . OSTEOARTHROSIS UNSPEC WHETHER GEN/LOCALIZED HAND  01/05/2008  . ARTHRITIS, CERVICAL SPINE 03/09/2007  . COLON POLYP 12/29/2006  . HYPERTENSION, BENIGN SYSTEMIC 12/29/2006    Past Surgical History:  Procedure Laterality Date  . TOTAL HIP ARTHROPLASTY  10/10    OB History    No data available       Home Medications    Prior to Admission medications   Medication Sig Start Date End Date Taking? Authorizing Provider  diclofenac sodium (VOLTAREN) 1 % GEL Apply 2 g topically 4 (four) times daily. 06/29/16   Asiyah Cletis Media, MD  ferrous sulfate 325 (65 FE) MG EC tablet Take 1 tablet (325 mg total) by mouth 2 (two) times daily. 01/23/16   Asiyah Cletis Media, MD  loperamide (IMODIUM) 2 MG capsule Take 2 mg by mouth 3 (three) times daily as needed for diarrhea or loose stools.    Historical Provider, MD  mometasone (NASONEX) 50 MCG/ACT nasal spray Place 2 sprays into the nose daily. 06/29/16   Asiyah Cletis Media, MD  Multiple Vitamins-Minerals (MULTI ADULT GUMMIES) CHEW Chew 2 tablets by mouth daily.    Historical Provider, MD  potassium chloride SA (K-DUR,KLOR-CON) 20 MEQ tablet Take 1 tablet (20 mEq total) by mouth 2 (two) times daily. 03/23/16   Asiyah Cletis Media, MD    Family History Family History  Problem Relation Age of Onset  . Cancer Mother   . Heart disease Mother   . Heart disease Father   . Cancer Sister     esophagus  . Cancer Sister     colon  . Cancer Sister  abdomin    Social History Social History  Substance Use Topics  . Smoking status: Never Smoker  . Smokeless tobacco: Never Used  . Alcohol use No     Allergies   Ciprofloxacin and Flagyl er [metronidazole]   Review of Systems Review of Systems  Constitutional: Negative for chills and fever.  Respiratory: Negative for choking and shortness of breath.   Cardiovascular: Negative for chest pain.  Gastrointestinal: Positive for abdominal distention, abdominal pain, nausea and vomiting. Negative for blood in stool.  All other systems reviewed  and are negative.    Physical Exam Updated Vital Signs BP 114/89 (BP Location: Right Arm)   Pulse (!) 106 Comment: RN Notified  Temp 97.9 F (36.6 C) (Oral)   Resp 20   Ht 5\' 2"  (1.575 m)   Wt 54.5 kg   SpO2 100%   BMI 21.98 kg/m   Physical Exam  Constitutional: She appears well-developed and well-nourished. No distress.  HENT:  Head: Normocephalic and atraumatic.  Eyes: Conjunctivae are normal.  Neck: Neck supple.  Cardiovascular: Normal rate and regular rhythm.   No murmur heard. Pulmonary/Chest: Effort normal and breath sounds normal. No respiratory distress.  Abdominal: Soft. She exhibits distension. She exhibits no mass. There is tenderness. There is no rebound and no guarding.  Musculoskeletal: She exhibits no edema.  Neurological: She is alert.  Skin: Skin is warm and dry.  Psychiatric: She has a normal mood and affect.  Nursing note and vitals reviewed.    ED Treatments / Results  Labs (all labs ordered are listed, but only abnormal results are displayed) Labs Reviewed  COMPREHENSIVE METABOLIC PANEL - Abnormal; Notable for the following:       Result Value   Total Protein 6.3 (*)    ALT 10 (*)    Alkaline Phosphatase 37 (*)    GFR calc non Af Amer 49 (*)    GFR calc Af Amer 57 (*)    All other components within normal limits  CBC - Abnormal; Notable for the following:    Hemoglobin 9.6 (*)    HCT 29.9 (*)    MCV 75.5 (*)    MCH 24.2 (*)    Platelets 132 (*)    All other components within normal limits  URINALYSIS, ROUTINE W REFLEX MICROSCOPIC (NOT AT [facility]) - Abnormal; Notable for the following:    APPearance TURBID (*)    Hgb urine dipstick MODERATE (*)    Ketones, ur 15 (*)    Protein, ur 30 (*)    Nitrite POSITIVE (*)    Leukocytes, UA MODERATE (*)    All other components within normal limits  URINE MICROSCOPIC-ADD ON - Abnormal; Notable for the following:    Squamous Epithelial / LPF 0-5 (*)    Bacteria, UA MANY (*)    All other components  within normal limits  LIPASE, BLOOD  BASIC METABOLIC PANEL  CBC  I-STAT CG4 LACTIC ACID, ED  I-STAT CG4 LACTIC ACID, ED    EKG  EKG Interpretation  Date/Time:  Wednesday July 14 2016 17:04:13 EDT Ventricular Rate:  61 PR Interval:    QRS Duration: 89 QT Interval:  426 QTC Calculation: 430 R Axis:   44 Text Interpretation:  Sinus rhythm Non-specific ST-t changes No significant change since last tracing Confirmed by Christy Gentles  MD, [address] ([id number]) on 07/14/2016 5:10:00 PM       Radiology Dg Abd Acute W/chest  Result Date: 07/14/2016 CLINICAL DATA:  Diffuse left-sided abdominal pain with bloating,  nausea, and vomiting for 2 months. Chronic constipation. EXAM: DG ABDOMEN ACUTE W/ 1V CHEST COMPARISON:  08/03/2014 FINDINGS: The cardiomediastinal silhouette is unchanged and within normal limits. Chronic lung changes are again noted with interstitial coarsening in scattered areas of scarring. No pleural effusion or pneumothorax is identified. There is no evidence of intraperitoneal free air. There is marked distention of the stomach, which contains a large amount of mottled material. Scattered, small air-fluid levels are noted in the bowel including in the descending colon, correlate for diarrheal illness. No bowel dilatation is suggestive of mechanical obstruction is seen. Prior right total hip arthroplasty. Lumbar spondylosis. Pelvic calcifications are similar to the prior study and likely represent phleboliths. IMPRESSION: 1. Marked distention of the stomach with large amount of debris which may reflect a bezoar and/or gastric outlet obstruction. 2. Chronic lung changes without evidence of acute cardiopulmonary process. Electronically Signed   By: Logan Bores M.D.   On: 07/14/2016 18:09    Procedures Procedures (including critical care time)  Medications Ordered in ED Medications  fluticasone (FLONASE) 50 MCG/ACT nasal spray 1 spray (not administered)  enoxaparin (LOVENOX) injection  30 mg (not administered)  famotidine (PEPCID) IVPB 20 mg premix (not administered)  0.9 %  sodium chloride infusion ( Intravenous New Bag/Given 07/14/16 2128)     Initial Impression / Assessment and Plan / ED Course  I have reviewed the triage vital signs and the nursing notes.  Pertinent labs & imaging results that were available during my care of the patient were reviewed by me and considered in my medical decision making (see chart for details).  Clinical Course    Patient is a 82 year old female with past medical history of hypertension, chronic diarrhea and intestinal bacterial overgrowth who comes in today complaining of abdominal pain bloating and decreased stool.  Patient states she has had GI issues since 2015, was diagnosed with bacterial overgrowth and given appropriate antibiotics.  Patient states her chronic diarrhea resolved however since that time she has had worsening abdominal discomfort following meals.  Patient also complains of increased gas and bloating.  Patient states that last night she had half a chicken sandwich and some ice cream which resulted in 10 out of 10 pain to the left upper quadrant.  Patient currently denies any pain or discomfort.  Patient denies fevers chills.  Patient states that she has had some emesis after eating.  Physical exam: Patient with tenderness to palpation left upper quadrant.  Abdomen soft and distended.  Remainder physical exam within normal limits.  Labs collected in triage largely within normal limits.  Patient with normal lipase.  Will check lactate, abdominal x-ray to evaluate stool burden, and reevaluate.  Abdominal xray showed likely obstructive process. Pt admitted to family medicine for further care.   Final Clinical Impressions(s) / ED Diagnoses   Final diagnoses:  None    New Prescriptions Current Discharge Medication List       Chapman Moss, MD 07/14/16 [id number]    Ripley Fraise, MD 07/15/16 0005

## 2016-07-14 NOTE — ED Notes (Signed)
Delay in lab draw, admitting MD examining pt

## 2016-07-14 NOTE — ED Notes (Signed)
Phlebotomy at bedside at this time.

## 2016-07-14 NOTE — ED Notes (Signed)
Attempted to call report x 1 to [address].  

## 2016-07-14 NOTE — ED Triage Notes (Signed)
Pt here with daughter with report of abd pain, constipation and n/v. She reports that she has been having small bowel movements but usually has loose stools. She also reports HTN and was recently taken off her BP meds.

## 2016-07-14 NOTE — ED Provider Notes (Signed)
Patient seen/examined in the Emergency Department in conjunction with Resident Physician Provider [place] Patient reports abd pain/bloating Exam : awake/alert, mild tenderness to LUQ.  Pt is in no active distress Plan: imaging/labs pending Patient currently awake/alert at the time of my evaluation     Ripley Fraise, MD 07/14/16 1710

## 2016-07-14 NOTE — ED Notes (Signed)
Admitting MD still at bedside at this time.

## 2016-07-14 NOTE — ED Notes (Signed)
Patient transported to X-ray 

## 2016-07-15 ENCOUNTER — Observation Stay (HOSPITAL_COMMUNITY): Payer: Medicare Other

## 2016-07-15 ENCOUNTER — Encounter (HOSPITAL_COMMUNITY): Payer: Self-pay | Admitting: Gastroenterology

## 2016-07-15 DIAGNOSIS — M179 Osteoarthritis of knee, unspecified: Secondary | ICD-10-CM | POA: Diagnosis present

## 2016-07-15 DIAGNOSIS — K315 Obstruction of duodenum: Secondary | ICD-10-CM | POA: Diagnosis present

## 2016-07-15 DIAGNOSIS — K2970 Gastritis, unspecified, without bleeding: Secondary | ICD-10-CM | POA: Diagnosis not present

## 2016-07-15 DIAGNOSIS — R140 Abdominal distension (gaseous): Secondary | ICD-10-CM | POA: Diagnosis not present

## 2016-07-15 DIAGNOSIS — K5669 Other intestinal obstruction: Secondary | ICD-10-CM | POA: Diagnosis not present

## 2016-07-15 DIAGNOSIS — R269 Unspecified abnormalities of gait and mobility: Secondary | ICD-10-CM | POA: Diagnosis not present

## 2016-07-15 DIAGNOSIS — M47817 Spondylosis without myelopathy or radiculopathy, lumbosacral region: Secondary | ICD-10-CM | POA: Diagnosis not present

## 2016-07-15 DIAGNOSIS — Z8673 Personal history of transient ischemic attack (TIA), and cerebral infarction without residual deficits: Secondary | ICD-10-CM | POA: Diagnosis not present

## 2016-07-15 DIAGNOSIS — E440 Moderate protein-calorie malnutrition: Secondary | ICD-10-CM | POA: Diagnosis present

## 2016-07-15 DIAGNOSIS — Z7951 Long term (current) use of inhaled steroids: Secondary | ICD-10-CM | POA: Diagnosis not present

## 2016-07-15 DIAGNOSIS — K2950 Unspecified chronic gastritis without bleeding: Secondary | ICD-10-CM | POA: Diagnosis not present

## 2016-07-15 DIAGNOSIS — Z809 Family history of malignant neoplasm, unspecified: Secondary | ICD-10-CM | POA: Diagnosis not present

## 2016-07-15 DIAGNOSIS — T189XXA Foreign body of alimentary tract, part unspecified, initial encounter: Secondary | ICD-10-CM

## 2016-07-15 DIAGNOSIS — E785 Hyperlipidemia, unspecified: Secondary | ICD-10-CM | POA: Diagnosis present

## 2016-07-15 DIAGNOSIS — R933 Abnormal findings on diagnostic imaging of other parts of digestive tract: Secondary | ICD-10-CM | POA: Diagnosis not present

## 2016-07-15 DIAGNOSIS — R64 Cachexia: Secondary | ICD-10-CM | POA: Diagnosis present

## 2016-07-15 DIAGNOSIS — R1012 Left upper quadrant pain: Secondary | ICD-10-CM | POA: Diagnosis present

## 2016-07-15 DIAGNOSIS — Z96641 Presence of right artificial hip joint: Secondary | ICD-10-CM | POA: Diagnosis present

## 2016-07-15 DIAGNOSIS — R112 Nausea with vomiting, unspecified: Secondary | ICD-10-CM | POA: Diagnosis not present

## 2016-07-15 DIAGNOSIS — M4716 Other spondylosis with myelopathy, lumbar region: Secondary | ICD-10-CM | POA: Diagnosis not present

## 2016-07-15 DIAGNOSIS — R188 Other ascites: Secondary | ICD-10-CM | POA: Diagnosis not present

## 2016-07-15 DIAGNOSIS — K2980 Duodenitis without bleeding: Secondary | ICD-10-CM | POA: Diagnosis present

## 2016-07-15 DIAGNOSIS — Z66 Do not resuscitate: Secondary | ICD-10-CM | POA: Diagnosis present

## 2016-07-15 DIAGNOSIS — K210 Gastro-esophageal reflux disease with esophagitis: Secondary | ICD-10-CM | POA: Diagnosis present

## 2016-07-15 DIAGNOSIS — K311 Adult hypertrophic pyloric stenosis: Principal | ICD-10-CM

## 2016-07-15 DIAGNOSIS — K6389 Other specified diseases of intestine: Secondary | ICD-10-CM

## 2016-07-15 DIAGNOSIS — E876 Hypokalemia: Secondary | ICD-10-CM | POA: Diagnosis present

## 2016-07-15 DIAGNOSIS — K3189 Other diseases of stomach and duodenum: Secondary | ICD-10-CM | POA: Diagnosis not present

## 2016-07-15 DIAGNOSIS — D649 Anemia, unspecified: Secondary | ICD-10-CM | POA: Diagnosis not present

## 2016-07-15 DIAGNOSIS — R109 Unspecified abdominal pain: Secondary | ICD-10-CM | POA: Diagnosis not present

## 2016-07-15 DIAGNOSIS — I10 Essential (primary) hypertension: Secondary | ICD-10-CM | POA: Diagnosis present

## 2016-07-15 DIAGNOSIS — D509 Iron deficiency anemia, unspecified: Secondary | ICD-10-CM | POA: Diagnosis present

## 2016-07-15 DIAGNOSIS — Z8249 Family history of ischemic heart disease and other diseases of the circulatory system: Secondary | ICD-10-CM | POA: Diagnosis not present

## 2016-07-15 DIAGNOSIS — T189XXD Foreign body of alimentary tract, part unspecified, subsequent encounter: Secondary | ICD-10-CM | POA: Diagnosis not present

## 2016-07-15 DIAGNOSIS — R197 Diarrhea, unspecified: Secondary | ICD-10-CM | POA: Diagnosis not present

## 2016-07-15 DIAGNOSIS — Z6820 Body mass index (BMI) 20.0-20.9, adult: Secondary | ICD-10-CM | POA: Diagnosis not present

## 2016-07-15 LAB — CBC
HCT: 28.9 % — ABNORMAL LOW (ref 36.0–46.0)
Hemoglobin: 9 g/dL — ABNORMAL LOW (ref 12.0–15.0)
MCH: 23.6 pg — ABNORMAL LOW (ref 26.0–34.0)
MCHC: 31.1 g/dL (ref 30.0–36.0)
MCV: 75.9 fL — ABNORMAL LOW (ref 78.0–100.0)
Platelets: 171 10*3/uL (ref 150–400)
RBC: 3.81 MIL/uL — ABNORMAL LOW (ref 3.87–5.11)
RDW: 14 % (ref 11.5–15.5)
WBC: 5.6 10*3/uL (ref 4.0–10.5)

## 2016-07-15 LAB — BASIC METABOLIC PANEL
Anion gap: 8 (ref 5–15)
BUN: 17 mg/dL (ref 6–20)
CO2: 22 mmol/L (ref 22–32)
Calcium: 8.4 mg/dL — ABNORMAL LOW (ref 8.9–10.3)
Chloride: 109 mmol/L (ref 101–111)
Creatinine, Ser: 0.97 mg/dL (ref 0.44–1.00)
GFR calc Af Amer: 58 mL/min — ABNORMAL LOW (ref >60)
GFR calc non Af Amer: 50 mL/min — ABNORMAL LOW (ref >60)
Glucose, Bld: 80 mg/dL (ref 65–99)
Potassium: 3.5 mmol/L (ref 3.5–5.1)
Sodium: 139 mmol/L (ref 135–145)

## 2016-07-15 MED ORDER — IOPAMIDOL (ISOVUE-300) INJECTION 61%
15.0000 mL | INTRAVENOUS | Status: AC
  Administered 2016-07-15 (×2): 15 mL via ORAL

## 2016-07-15 MED ORDER — ORAL CARE MOUTH RINSE
15.0000 mL | Freq: Two times a day (BID) | OROMUCOSAL | Status: DC
  Administered 2016-07-15 (×2): 15 mL via OROMUCOSAL

## 2016-07-15 MED ORDER — WHITE PETROLATUM GEL
Status: AC
Start: 2016-07-15 — End: 2016-07-15
  Administered 2016-07-15: 13:00:00
  Filled 2016-07-15: qty 1

## 2016-07-15 MED ORDER — ONDANSETRON HCL 4 MG/2ML IJ SOLN
4.0000 mg | Freq: Four times a day (QID) | INTRAMUSCULAR | Status: DC | PRN
  Administered 2016-07-15 – 2016-07-18 (×2): 4 mg via INTRAVENOUS
  Filled 2016-07-15 (×2): qty 2

## 2016-07-15 MED ORDER — KETOROLAC TROMETHAMINE 15 MG/ML IJ SOLN
15.0000 mg | Freq: Once | INTRAMUSCULAR | Status: AC
  Administered 2016-07-15: 15 mg via INTRAVENOUS
  Filled 2016-07-15: qty 1

## 2016-07-15 MED ORDER — CHLORHEXIDINE GLUCONATE 0.12 % MT SOLN
15.0000 mL | Freq: Two times a day (BID) | OROMUCOSAL | Status: DC
  Administered 2016-07-15 – 2016-07-24 (×14): 15 mL via OROMUCOSAL
  Filled 2016-07-15 (×13): qty 15

## 2016-07-15 MED ORDER — IOPAMIDOL (ISOVUE-300) INJECTION 61%
INTRAVENOUS | Status: AC
Start: 2016-07-15 — End: 2016-07-15
  Administered 2016-07-15: 100 mL
  Filled 2016-07-15: qty 100

## 2016-07-15 MED ORDER — DEXTROSE-NACL 5-0.9 % IV SOLN
INTRAVENOUS | Status: AC
  Administered 2016-07-15 – 2016-07-16 (×2): via INTRAVENOUS

## 2016-07-15 MED ORDER — DICLOFENAC SODIUM 1 % TD GEL
2.0000 g | Freq: Four times a day (QID) | TRANSDERMAL | Status: DC
  Administered 2016-07-15 – 2016-07-25 (×24): 2 g via TOPICAL
  Filled 2016-07-15 (×2): qty 100

## 2016-07-15 NOTE — Progress Notes (Signed)
Family Medicine Teaching Service Daily Progress Note Intern Pager: [phone]  Patient name: Linda Macias Medical record number: [id number] Date of birth: 10-07-1923 Age: 82 y.o. Gender: female  Primary Care Provider: Lockie Pares, MD Consultants: GI Code Status: DNR  Pt Overview and Major Events to Date:  9/13 admitted with 61m of nausea and NBNB emesis 9/14 GI consulted 9/15 endoscopy scheduled  Assessment and Plan: Linda Macias is a 82 y.o. female presenting with abdominal distention, nausea/vomiting, constipation progressively worsening for 2 months. PMH is significant for small intestinal bacterial overgrowth, chronic diarrhea, HTN (stable off meds), LV Diastolic Dysfunction, IBS, hx of TIA.   Concern for Bezoar vs Gastric Outlet Obstruction: Symptoms of abdominal bloating, gas, post-prandial abdominal pain, N/V for 2 months with worsening symptoms. Other causes to consider for possible gastric outlet obstruction PUD. Also consider malignancy. But patient has been gaining weight per chart review. With symptoms of post-prandial abdominal pain, considered mesenteric ischemia; patient had mesenteric doppler study in February 2016 which did not reveal any significant mesenteric arterial stenosis. Currently no abdominal pain or tenderness to palpation; having normal BMs. Patient's vitals are stable except for mild tachycardia. Her lab work is unremarkable with no leukocytosis. Lipase is wnl. Is followed by [facility] for small intestinal bacterial overgrowth. Per grand-daughter, has never had EGD.  - GI consulted, appreciate recs: endoscopy 9/15, CT abdomen - NPO with NG tube placement - CT abdomen pending per GI - IV Pepcid while NPO - IVF with D5NS at maintenance (NS @ 90cc/hr) - GI consult in the AM ([facility])  HTN: Not on medications at home. Currently stable - will monitor  History of Small intestinal bacterial overgrowth: was diagnosed with this about 1 year ago and had  associated weight loss. Was on Xifaxan but currently on this. Her chronic diarrhea and weight improved despite being off of Xifaxan. Was seen by GI a few weeks ago by GI, and granddaughter reports patient was doing well per GI at that time. Currently having soft stools, no diarrhea.   History of Hypokalemia: on Kdur 61meq BID at home. Not on potassium wasting agents at home. Has history of chronic diarrhea,currently resolved. K 3.8 on admit.  - will monitor and repleat as needed.   Microcytic Anemia: hgb 9 on admit. Baseline ~8. No signs of bleeding. Last Colonoscopy in 07/2014 with two polyps with pathology of tubular adenoma and no high grade dysplasia. Per grand-daughter, patient has never had an EGD.  - monitor with labs  Left Ventricular Diastolic Dysfunction: ECHO 08/2015 with EF 55-60%, pseudonormal LV filling pattern with G2DD. Trace pitting edema up to knees. Lungs are clear to auscultation. No dyspnea or orthopnea.  - will monitor  FEN/GI: NPO with NG tube  Prophylaxis: Lovenox  Disposition: continued inpatient management   Subjective:  Daughter helping patient get a bath. Linda Macias is very pleasant and feels well. She denies any pain, and is eager to hear what GI thinks/plans.  Objective: Temp:  [97.9 F (36.6 C)-98.3 F (36.8 C)] 98 F (36.7 C) (09/14 0534) Pulse Rate:  [59-108] 84 (09/14 0534) Resp:  [7-24] 17 (09/14 0534) BP: (107-194)/(70-98) 107/74 (09/14 0534) SpO2:  [99 %-100 %] 100 % (09/14 0534) Weight:  [115 lb (52.2 kg)-120 lb 2.4 oz (54.5 kg)] 120 lb 2.4 oz (54.5 kg) (09/13 2106) Physical Exam: GEN: NAD, thin elderly female CV: Regular rhythm, mildly tachycardic, no murmurs, rubs, or gallops PULM: CTAB, normal effort ABD: Soft, mildly distended in LUQ,  nontender, + BS SKIN: No rash or cyanosis; warm and well-perfused EXTR: trace edema to knees bilaterally, no calf tenderness; TED hose in place PSYCH: Mood and affect euthymic, normal rate and volume of  speech NEURO: Awake, alert, no focal deficits grossly, normal speech  Laboratory:  Recent Labs Lab 07/14/16 1213 07/15/16 0526  WBC 5.6 5.6  HGB 9.6* 9.0*  HCT 29.9* 28.9*  PLT 132* 171    Recent Labs Lab 07/14/16 1213 07/15/16 0526  NA 137 139  K 3.8 3.5  CL 107 109  CO2 22 22  BUN 17 17  CREATININE 0.98 0.97  CALCIUM 9.2 8.4*  PROT 6.3*  --   BILITOT 0.7  --   ALKPHOS 37*  --   ALT 10*  --   AST 16  --   GLUCOSE 86 80    Imaging/Diagnostic Tests: Ct abdomen ordered  Sela Hilding, MD 07/15/2016, 8:37 AM PGY-1, [place] Intern pager: [phone], text pages welcome

## 2016-07-15 NOTE — Care Management Obs Status (Signed)
[place] NOTIFICATION   Patient Details  Name: Linda Macias MRN: [id number] Date of Birth: 26-Aug-1923   Medicare Observation Status Notification Given:  Yes (explained observation letter, patient has no questions)    Apolonio Schneiders, RN 07/15/2016, 6:13 PM

## 2016-07-15 NOTE — Progress Notes (Signed)
Have tentatively scheduled patient for endoscopy tomorrow. Will get CT first and see later today

## 2016-07-15 NOTE — Consult Note (Signed)
Reason for Consult: Gastric outlet obstruction Referring Physician: Hospital team  Linda Macias is an 82 y.o. female.  HPI: Patient known to my partner Dr. B with multiple colonoscopies for polyps the last one in 2015 and with prior endoscopy 2 years ago as well showing just gastritis otherwise normal who had diarrhea problems seen at [facility] over the last year and had initially lost 50 pounds but has gained about 30 of it back however 2 weeks ago she began having increased constipation and upper abdominal pain and threw up 3 times today and is actually feeling better and also had a few bowel movements and her CT scan is pending and her hospital computer chart and our office computer chart was reviewed and she has no other complaints  Past Medical History:  Diagnosis Date  . Arthritis   . CVA (cerebral infarction)    No residuals, cannot tolerate statins   . Hyperlipidemia   . Hypertension   . Osteoarthritis, knee/lower leg     Past Surgical History:  Procedure Laterality Date  . TOTAL HIP ARTHROPLASTY  10/10    Family History  Problem Relation Age of Onset  . Cancer Mother   . Heart disease Mother   . Heart disease Father   . Cancer Sister     esophagus  . Cancer Sister     colon  . Cancer Sister     abdomin    Social History:  reports that she has never smoked. She has never used smokeless tobacco. She reports that she does not drink alcohol or use drugs.  Allergies:  Allergies  Allergen Reactions  . Ciprofloxacin Other (See Comments)    Diarrhea for 37 days  . Flagyl Er [Metronidazole]     Diarrhea     Medications: I have reviewed the patient's current medications.  Results for orders placed or performed during the hospital encounter of 07/14/16 (from the past 48 hour(s))  Lipase, blood     Status: None   Collection Time: 07/14/16 12:13 PM  Result Value Ref Range   Lipase 20 11 - 51 U/L  Comprehensive metabolic panel     Status: Abnormal   Collection Time:  07/14/16 12:13 PM  Result Value Ref Range   Sodium 137 135 - 145 mmol/L   Potassium 3.8 3.5 - 5.1 mmol/L   Chloride 107 101 - 111 mmol/L   CO2 22 22 - 32 mmol/L   Glucose, Bld 86 65 - 99 mg/dL   BUN 17 6 - 20 mg/dL   Creatinine, Ser 0.98 0.44 - 1.00 mg/dL   Calcium 9.2 8.9 - 10.3 mg/dL   Total Protein 6.3 (L) 6.5 - 8.1 g/dL   Albumin 3.8 3.5 - 5.0 g/dL   AST 16 15 - 41 U/L   ALT 10 (L) 14 - 54 U/L   Alkaline Phosphatase 37 (L) 38 - 126 U/L   Total Bilirubin 0.7 0.3 - 1.2 mg/dL   GFR calc non Af Amer 49 (L) >60 mL/min   GFR calc Af Amer 57 (L) >60 mL/min    Comment: (NOTE) The eGFR has been calculated using the CKD EPI equation. This calculation has not been validated in all clinical situations. eGFR's persistently <60 mL/min signify possible Chronic Kidney Disease.    Anion gap 8 5 - 15  CBC     Status: Abnormal   Collection Time: 07/14/16 12:13 PM  Result Value Ref Range   WBC 5.6 4.0 - 10.5 K/uL   RBC 3.96  3.87 - 5.11 MIL/uL   Hemoglobin 9.6 (L) 12.0 - 15.0 g/dL   HCT 29.9 (L) 36.0 - 46.0 %   MCV 75.5 (L) 78.0 - 100.0 fL   MCH 24.2 (L) 26.0 - 34.0 pg   MCHC 32.1 30.0 - 36.0 g/dL   RDW 14.3 11.5 - 15.5 %   Platelets 132 (L) 150 - 400 K/uL    Comment: PLATELET COUNT CONFIRMED BY SMEAR  I-Stat CG4 Lactic Acid, ED     Status: None   Collection Time: 07/14/16  5:23 PM  Result Value Ref Range   Lactic Acid, Venous 1.09 0.5 - 1.9 mmol/L  Urinalysis, Routine w reflex microscopic     Status: Abnormal   Collection Time: 07/14/16  7:55 PM  Result Value Ref Range   Color, Urine YELLOW YELLOW   APPearance TURBID (A) CLEAR   Specific Gravity, Urine 1.022 1.005 - 1.030   pH 5.0 5.0 - 8.0   Glucose, UA NEGATIVE NEGATIVE mg/dL   Hgb urine dipstick MODERATE (A) NEGATIVE   Bilirubin Urine NEGATIVE NEGATIVE   Ketones, ur 15 (A) NEGATIVE mg/dL   Protein, ur 30 (A) NEGATIVE mg/dL   Nitrite POSITIVE (A) NEGATIVE   Leukocytes, UA MODERATE (A) NEGATIVE  Urine microscopic-add on      Status: Abnormal   Collection Time: 07/14/16  7:55 PM  Result Value Ref Range   Squamous Epithelial / LPF 0-5 (A) NONE SEEN   WBC, UA 6-30 0 - 5 WBC/hpf   RBC / HPF 0-5 0 - 5 RBC/hpf   Bacteria, UA MANY (A) NONE SEEN   Urine-Other LESS THAN 10 mL OF URINE SUBMITTED   Basic metabolic panel     Status: Abnormal   Collection Time: 07/15/16  5:26 AM  Result Value Ref Range   Sodium 139 135 - 145 mmol/L   Potassium 3.5 3.5 - 5.1 mmol/L   Chloride 109 101 - 111 mmol/L   CO2 22 22 - 32 mmol/L   Glucose, Bld 80 65 - 99 mg/dL   BUN 17 6 - 20 mg/dL   Creatinine, Ser 0.97 0.44 - 1.00 mg/dL   Calcium 8.4 (L) 8.9 - 10.3 mg/dL   GFR calc non Af Amer 50 (L) >60 mL/min   GFR calc Af Amer 58 (L) >60 mL/min    Comment: (NOTE) The eGFR has been calculated using the CKD EPI equation. This calculation has not been validated in all clinical situations. eGFR's persistently <60 mL/min signify possible Chronic Kidney Disease.    Anion gap 8 5 - 15  CBC     Status: Abnormal   Collection Time: 07/15/16  5:26 AM  Result Value Ref Range   WBC 5.6 4.0 - 10.5 K/uL   RBC 3.81 (L) 3.87 - 5.11 MIL/uL   Hemoglobin 9.0 (L) 12.0 - 15.0 g/dL   HCT 28.9 (L) 36.0 - 46.0 %   MCV 75.9 (L) 78.0 - 100.0 fL   MCH 23.6 (L) 26.0 - 34.0 pg   MCHC 31.1 30.0 - 36.0 g/dL   RDW 14.0 11.5 - 15.5 %   Platelets 171 150 - 400 K/uL    Dg Abd Acute W/chest  Result Date: 07/14/2016 CLINICAL DATA:  Diffuse left-sided abdominal pain with bloating, nausea, and vomiting for 2 months. Chronic constipation. EXAM: DG ABDOMEN ACUTE W/ 1V CHEST COMPARISON:  08/03/2014 FINDINGS: The cardiomediastinal silhouette is unchanged and within normal limits. Chronic lung changes are again noted with interstitial coarsening in scattered areas of scarring. No pleural  effusion or pneumothorax is identified. There is no evidence of intraperitoneal free air. There is marked distention of the stomach, which contains a large amount of mottled material.  Scattered, small air-fluid levels are noted in the bowel including in the descending colon, correlate for diarrheal illness. No bowel dilatation is suggestive of mechanical obstruction is seen. Prior right total hip arthroplasty. Lumbar spondylosis. Pelvic calcifications are similar to the prior study and likely represent phleboliths. IMPRESSION: 1. Marked distention of the stomach with large amount of debris which may reflect a bezoar and/or gastric outlet obstruction. 2. Chronic lung changes without evidence of acute cardiopulmonary process. Electronically Signed   By: Logan Bores M.D.   On: 07/14/2016 18:09    ROS negative except above Blood pressure 107/74, pulse 84, temperature 98 F (36.7 C), temperature source Oral, resp. rate 17, height 5' 2"  (1.575 m), weight 54.5 kg (120 lb 2.4 oz), SpO2 100 %. Physical Exam vital signs stable afebrile no acute distress currently in good spirits lungs are clear heart regular rate and rhythm abdomen is soft nontender occasional bowel sounds no succussion splash labs and x-ray reviewed await CT scan  Assessment/Plan: Probable gastric outlet obstruction Plan: The risks benefits methods of endoscopy was discussed with the patient and barring something different showing on CT scan will proceed later tomorrow morning with further workup and plans pending those findings have asked the nurse to call me with the CT report  Onslow E 07/15/2016, 5:30 PM

## 2016-07-16 ENCOUNTER — Encounter (HOSPITAL_COMMUNITY): Payer: Self-pay

## 2016-07-16 ENCOUNTER — Inpatient Hospital Stay (HOSPITAL_COMMUNITY): Payer: Medicare Other | Admitting: Certified Registered Nurse Anesthetist

## 2016-07-16 ENCOUNTER — Encounter (HOSPITAL_COMMUNITY)
Admission: EM | Disposition: A | Discharge status: Home / Self Care | Payer: Self-pay | Source: Home / Self Care | Attending: Family Medicine

## 2016-07-16 DIAGNOSIS — K5669 Other intestinal obstruction: Secondary | ICD-10-CM

## 2016-07-16 HISTORY — PX: ESOPHAGOGASTRODUODENOSCOPY: SHX5428

## 2016-07-16 LAB — CBC
HCT: 29.3 % — ABNORMAL LOW (ref 36.0–46.0)
Hemoglobin: 9.4 g/dL — ABNORMAL LOW (ref 12.0–15.0)
MCH: 24 pg — ABNORMAL LOW (ref 26.0–34.0)
MCHC: 32.1 g/dL (ref 30.0–36.0)
MCV: 74.9 fL — ABNORMAL LOW (ref 78.0–100.0)
Platelets: 130 10*3/uL — ABNORMAL LOW (ref 150–400)
RBC: 3.91 MIL/uL (ref 3.87–5.11)
RDW: 14.6 % (ref 11.5–15.5)
WBC: 5.4 10*3/uL (ref 4.0–10.5)

## 2016-07-16 LAB — BASIC METABOLIC PANEL
Anion gap: 5 (ref 5–15)
BUN: 16 mg/dL (ref 6–20)
CO2: 20 mmol/L — ABNORMAL LOW (ref 22–32)
Calcium: 8.1 mg/dL — ABNORMAL LOW (ref 8.9–10.3)
Chloride: 113 mmol/L — ABNORMAL HIGH (ref 101–111)
Creatinine, Ser: 0.91 mg/dL (ref 0.44–1.00)
GFR calc Af Amer: >60 mL/min (ref >60)
GFR calc non Af Amer: 54 mL/min — ABNORMAL LOW (ref >60)
Glucose, Bld: 129 mg/dL — ABNORMAL HIGH (ref 65–99)
Potassium: 3.4 mmol/L — ABNORMAL LOW (ref 3.5–5.1)
Sodium: 138 mmol/L (ref 135–145)

## 2016-07-16 LAB — URINE CULTURE

## 2016-07-16 SURGERY — EGD (ESOPHAGOGASTRODUODENOSCOPY)
Anesthesia: Monitor Anesthesia Care

## 2016-07-16 MED ORDER — ORAL CARE MOUTH RINSE
15.0000 mL | Freq: Two times a day (BID) | OROMUCOSAL | Status: DC
  Administered 2016-07-16: 15 mL via OROMUCOSAL

## 2016-07-16 MED ORDER — SODIUM CHLORIDE 0.9 % IV SOLN: INTRAVENOUS | Status: DC

## 2016-07-16 MED ORDER — PROPOFOL 500 MG/50ML IV EMUL: INTRAVENOUS | Status: DC | PRN

## 2016-07-16 MED ORDER — LIDOCAINE 2% (20 MG/ML) 5 ML SYRINGE
INTRAMUSCULAR | Status: DC | PRN
  Administered 2016-07-16: 60 mg via INTRAVENOUS

## 2016-07-16 MED ORDER — ONDANSETRON HCL 4 MG/2ML IJ SOLN
INTRAMUSCULAR | Status: DC | PRN
  Administered 2016-07-16: 4 mg via INTRAVENOUS

## 2016-07-16 MED ORDER — LACTATED RINGERS IV SOLN
INTRAVENOUS | Status: DC | PRN
  Administered 2016-07-16: 12:00:00 via INTRAVENOUS

## 2016-07-16 MED ORDER — SUCCINYLCHOLINE CHLORIDE 20 MG/ML IJ SOLN
INTRAMUSCULAR | Status: DC | PRN
  Administered 2016-07-16: 60 mg via INTRAVENOUS

## 2016-07-16 MED ORDER — PROPOFOL 10 MG/ML IV BOLUS
INTRAVENOUS | Status: DC | PRN
  Administered 2016-07-16: 50 mg via INTRAVENOUS

## 2016-07-16 MED ORDER — SODIUM CHLORIDE 0.9 % IV SOLN
INTRAVENOUS | Status: DC
Start: 2016-07-16 — End: 2016-07-16

## 2016-07-16 MED ORDER — PHENYLEPHRINE HCL 10 MG/ML IJ SOLN
INTRAMUSCULAR | Status: DC | PRN
  Administered 2016-07-16: 120 ug via INTRAVENOUS

## 2016-07-16 NOTE — Progress Notes (Signed)
PARENTERAL NUTRITION CONSULT NOTE - INITIAL  Pharmacy Consult for TPN Indication: SBO  Allergies  Allergen Reactions  . Ciprofloxacin Other (See Comments)    Diarrhea for 37 days  . Flagyl Er [Metronidazole]     Diarrhea     Patient Measurements: Height: 5\' 2"  (157.5 cm) Weight: 120 lb 2.4 oz (54.5 kg) IBW/kg (Calculated) : 50.1   Vital Signs: Temp: 98.4 F (36.9 C) (09/15 1502) Temp Source: Oral (09/15 1502) BP: 162/77 (09/15 1502) Pulse Rate: 61 (09/15 1502) Intake/Output from previous day: 09/14 0701 - 09/15 0700 In: 1568 [I.V.:1458; NG/GT:60; IV Piggyback:50] Out: 1400 [Urine:300; Emesis/NG output:1100] Intake/Output from this shift: Total I/O In: 350 [I.V.:350] Out: 220 [Urine:170; Emesis/NG output:50]  Labs:  Recent Labs  07/14/16 1213 07/15/16 0526 07/16/16 0628  WBC 5.6 5.6 5.4  HGB 9.6* 9.0* 9.4*  HCT 29.9* 28.9* 29.3*  PLT 132* 171 130*     Recent Labs  07/14/16 1213 07/15/16 0526 07/16/16 0628  NA 137 139 138  K 3.8 3.5 3.4*  CL 107 109 113*  CO2 22 22 20*  GLUCOSE 86 80 129*  BUN 17 17 16   CREATININE 0.98 0.97 0.91  CALCIUM 9.2 8.4* 8.1*  PROT 6.3*  --   --   ALBUMIN 3.8  --   --   AST 16  --   --   ALT 10*  --   --   ALKPHOS 37*  --   --   BILITOT 0.7  --   --    Estimated Creatinine Clearance: 32.5 mL/min (by C-G formula based on SCr of 0.91 mg/dL).   No results for input(s): GLUCAP in the last 72 hours.  Medical History: Past Medical History:  Diagnosis Date  . Arthritis   . Cancer ([place])   . CVA (cerebral infarction)    No residuals, cannot tolerate statins   . Hyperlipidemia   . Hypertension   . Osteoarthritis, knee/lower leg     Insulin Requirements in the past 24 hours:  None  Current Nutrition:  NPO  Assessment: 82 yo F presents on 9/13 after about 2 weeks of vomiting after every meal and abdominal pain with distension. Has a PMH significant for  She reports reduced appetite and losing a couple pounds over  the last few months. Xray showed bezoar vs GOO. GI consulted. Pharmacy consulted to start TPN for SBO.  Nutritional Goals:  Follow up RD recs  Plan:  TPN ordered after 12 pm cut off for the day. Also PICC not placed yet today. Will plan to start TPN tomorrow Ordered labs for tomorrow Can transition Pepcid into TPN tomorrow  Elenor Quinones, PharmD, BCPS Clinical Pharmacist Pager [phone] 07/16/2016 3:23 PM

## 2016-07-16 NOTE — Progress Notes (Signed)
Linda Macias 12:23 PM  Subjective: Patient doing well without any new complaints and we reviewed her CT with her and her daughter  Objective: Vital signs stable afebrile no acute distress exam please see preassessment evaluation abdomen occasional bowel sounds nontender no splash-labs stable CT without obvious cause of gastric outlet obstruction  Assessment: Gastric outlet obstruction  Plan: Okay to proceed with endoscopy with anesthesia assistance and case discussed with anesthesia team as well as daughter  [facility]  Pager [phone] After 5PM or if no answer call [phone]

## 2016-07-16 NOTE — Anesthesia Postprocedure Evaluation (Signed)
Anesthesia Post Note  Patient: CHAPIN HALLY  Procedure(s) Performed: Procedure(s) (LRB): ESOPHAGOGASTRODUODENOSCOPY (EGD) (N/A)  Patient location during evaluation: PACU Anesthesia Type: General Level of consciousness: awake and alert Pain management: pain level controlled Vital Signs Assessment: post-procedure vital signs reviewed and stable Respiratory status: spontaneous breathing, nonlabored ventilation and respiratory function stable Cardiovascular status: blood pressure returned to baseline and stable Postop Assessment: no signs of nausea or vomiting Anesthetic complications: no    Last Vitals:  Vitals:   07/16/16 1400 07/16/16 1410  BP: (!) 169/97 (!) 178/92  Pulse: (!) 59 65  Resp: 15 14  Temp:      Last Pain:  Vitals:   07/16/16 1312  TempSrc: Oral  PainSc:                  Tarry Blayney,W. EDMOND

## 2016-07-16 NOTE — Consult Note (Signed)
Reason for Consult: duodenal mass Referring Physician: Sela Hilding, MD  Linda Macias Linda Macias is an 82 y.o. female.  HPI: Pt presented to the emergency department on 07/14/16 with abdominal pain decreased appetite and vomiting after eating, bloating after eating for several days. She has also been having bowel movements but small loose movements. She has a history of hypertension was recently taken off her blood pressure medicines. The evening prior to admission she had chicken sandwich and had 10 over 10 pain in the left upper quadrant, followed by emesis. She had been followed by [facility] for diarrhea and weight loss going back to February 2016. Workup was included a PET scan, mesenteric Dopplers and HBT. Present opinion she had a severe bacterial overgrowth and she was treated with Xifaxan, which was denied by her insurance initially and took some time to resolve.  After the drug was started she seemed to improve. I do not see a follow up after 10/07/2015. She is also followed in the [facility] by Dr. [name].    Workup in the emergency department shows she is afebrile blood pressure is actually up now. Admission labs were unremarkable aside from a decreased total protein and mild anemia. Platelets were also low at 132,000. Three-way admitted x-ray and abdominal film is marked distention of the stomach with large amount of debris which may reflect basilar gastric outlet syndrome. She also has chronic lung changes without evidence of an acute pulmonary issue. CT scan was then obtained. This showed marked gastric distention with fluid and debris in the stomach there was an 8 mm hypodensity present within the second portion the duodenum. He was uncertain whether this was soft tissue mass or debris. The second portion of the duodenum showed some prominence and there was consideration of SMA syndrome. Had some periportal edema and mild intrahepatic biliary  dilatation. Pancreatic duct also appeared large and there are small bilateral effusions around both lungs.  She has been seen by GI and Dr. Lizbeth Bark. EGD was performed today. This shows a grade B reflux esophagitis, a medium amount of food residue in the stomach, obstruction in the duodenal bulb. Biopsies were taken. The second portion the duodenum portions of the duodenum were normal as seen with the pediatric endoscope. Biopsies been obtained and we were asked to see in consultation.  Currently no diet order, and she is on no diuretic, urine culture shows multiple species.  Past Medical History:  Diagnosis Date  . Arthritis   . Cancer ([place])   . CVA (cerebral infarction)    No residuals, cannot tolerate statins   . Hyperlipidemia   . Hypertension   . Osteoarthritis, knee/lower leg     Past Surgical History:  Procedure Laterality Date  . TOTAL HIP ARTHROPLASTY  10/10    Family History  Problem Relation Age of Onset  . Cancer Mother   . Heart disease Mother   . Heart disease Father   . Cancer Sister     esophagus  . Cancer Sister     colon  . Cancer Sister     abdomin    Social History:  reports that she has never smoked. She has never used smokeless tobacco. She reports that she does not drink alcohol or use drugs. 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 Allergies:  Allergies  Allergen Reactions  . Ciprofloxacin Other (See Comments)    Diarrhea for 37 days  .  Flagyl Er [Metronidazole]     Diarrhea     Medications:  Prior to Admission:  Prescriptions Prior to Admission  Medication Sig Dispense Refill Last Dose  . diclofenac sodium (VOLTAREN) 1 % GEL Apply 2 g topically 4 (four) times daily. 100 g 0 07/14/2016 at Unknown time  . mometasone (NASONEX) 50 MCG/ACT nasal spray Place 2 sprays into the nose daily. 17 g 12 07/14/2016 at Unknown time  . potassium chloride SA (K-DUR,KLOR-CON) 20 MEQ tablet Take 1 tablet (20 mEq total) by mouth 2  (two) times daily. 60 tablet 3 07/14/2016 at Unknown time  . ferrous sulfate 325 (65 FE) MG EC tablet Take 1 tablet (325 mg total) by mouth 2 (two) times daily. (Patient not taking: Reported on 07/15/2016) 30 tablet 3 Not Taking at Unknown time   Scheduled: . chlorhexidine  15 mL Mouth Rinse BID  . diclofenac sodium  2 g Topical QID  . enoxaparin (LOVENOX) injection  30 mg Subcutaneous Q24H  . famotidine (PEPCID) IV  20 mg Intravenous Q24H  . fluticasone  1 spray Each Nare Daily   Continuous:  TDH:RCBULAGTXMI (ZOFRAN) IV  Results for orders placed or performed during the hospital encounter of 07/14/16 (from the past 48 hour(s))  I-Stat CG4 Lactic Acid, ED     Status: None   Collection Time: 07/14/16  5:23 PM  Result Value Ref Range   Lactic Acid, Venous 1.09 0.5 - 1.9 mmol/L  Urinalysis, Routine w reflex microscopic     Status: Abnormal   Collection Time: 07/14/16  7:55 PM  Result Value Ref Range   Color, Urine YELLOW YELLOW   APPearance TURBID (A) CLEAR   Specific Gravity, Urine 1.022 1.005 - 1.030   pH 5.0 5.0 - 8.0   Glucose, UA NEGATIVE NEGATIVE mg/dL   Hgb urine dipstick MODERATE (A) NEGATIVE   Bilirubin Urine NEGATIVE NEGATIVE   Ketones, ur 15 (A) NEGATIVE mg/dL   Protein, ur 30 (A) NEGATIVE mg/dL   Nitrite POSITIVE (A) NEGATIVE   Leukocytes, UA MODERATE (A) NEGATIVE  Urine microscopic-add on     Status: Abnormal   Collection Time: 07/14/16  7:55 PM  Result Value Ref Range   Squamous Epithelial / LPF 0-5 (A) NONE SEEN   WBC, UA 6-30 0 - 5 WBC/hpf   RBC / HPF 0-5 0 - 5 RBC/hpf   Bacteria, UA MANY (A) NONE SEEN   Urine-Other LESS THAN 10 mL OF URINE SUBMITTED   Urine culture     Status: Abnormal   Collection Time: 07/14/16  7:55 PM  Result Value Ref Range   Specimen Description URINE, CLEAN CATCH    Special Requests NONE    Culture MULTIPLE SPECIES PRESENT, SUGGEST RECOLLECTION (A)    Report Status 07/16/2016 FINAL   Basic metabolic panel     Status: Abnormal    Collection Time: 07/15/16  5:26 AM  Result Value Ref Range   Sodium 139 135 - 145 mmol/L   Potassium 3.5 3.5 - 5.1 mmol/L   Chloride 109 101 - 111 mmol/L   CO2 22 22 - 32 mmol/L   Glucose, Bld 80 65 - 99 mg/dL   BUN 17 6 - 20 mg/dL   Creatinine, Ser 0.97 0.44 - 1.00 mg/dL   Calcium 8.4 (L) 8.9 - 10.3 mg/dL   GFR calc non Af Amer 50 (L) >60 mL/min   GFR calc Af Amer 58 (L) >60 mL/min    Comment: (NOTE) The eGFR has been calculated using the CKD  EPI equation. This calculation has not been validated in all clinical situations. eGFR's persistently <60 mL/min signify possible Chronic Kidney Disease.    Anion gap 8 5 - 15  CBC     Status: Abnormal   Collection Time: 07/15/16  5:26 AM  Result Value Ref Range   WBC 5.6 4.0 - 10.5 K/uL   RBC 3.81 (L) 3.87 - 5.11 MIL/uL   Hemoglobin 9.0 (L) 12.0 - 15.0 g/dL   HCT 28.9 (L) 36.0 - 46.0 %   MCV 75.9 (L) 78.0 - 100.0 fL   MCH 23.6 (L) 26.0 - 34.0 pg   MCHC 31.1 30.0 - 36.0 g/dL   RDW 14.0 11.5 - 15.5 %   Platelets 171 150 - 400 K/uL  Basic metabolic panel Once     Status: Abnormal   Collection Time: 07/16/16  6:28 AM  Result Value Ref Range   Sodium 138 135 - 145 mmol/L   Potassium 3.4 (L) 3.5 - 5.1 mmol/L   Chloride 113 (H) 101 - 111 mmol/L   CO2 20 (L) 22 - 32 mmol/L   Glucose, Bld 129 (H) 65 - 99 mg/dL   BUN 16 6 - 20 mg/dL   Creatinine, Ser 0.91 0.44 - 1.00 mg/dL   Calcium 8.1 (L) 8.9 - 10.3 mg/dL   GFR calc non Af Amer 54 (L) >60 mL/min   GFR calc Af Amer >60 >60 mL/min    Comment: (NOTE) The eGFR has been calculated using the CKD EPI equation. This calculation has not been validated in all clinical situations. eGFR's persistently <60 mL/min signify possible Chronic Kidney Disease.    Anion gap 5 5 - 15  CBC Once     Status: Abnormal   Collection Time: 07/16/16  6:28 AM  Result Value Ref Range   WBC 5.4 4.0 - 10.5 K/uL    Comment: WHITE COUNT CONFIRMED ON SMEAR REPEATED TO VERIFY    RBC 3.91 3.87 - 5.11 MIL/uL    Hemoglobin 9.4 (L) 12.0 - 15.0 g/dL   HCT 29.3 (L) 36.0 - 46.0 %   MCV 74.9 (L) 78.0 - 100.0 fL   MCH 24.0 (L) 26.0 - 34.0 pg   MCHC 32.1 30.0 - 36.0 g/dL   RDW 14.6 11.5 - 15.5 %   Platelets 130 (L) 150 - 400 K/uL    Comment: PLATELET COUNT CONFIRMED BY SMEAR REPEATED TO VERIFY     Ct Abdomen Pelvis W Contrast  Result Date: 07/15/2016 CLINICAL DATA:  Gastric outlet obstruction EXAM: CT ABDOMEN AND PELVIS WITH CONTRAST TECHNIQUE: Multidetector CT imaging of the abdomen and pelvis was performed using the standard protocol following bolus administration of intravenous contrast. CONTRAST:  100 mL ISOVUE-300 IOPAMIDOL (ISOVUE-300) INJECTION 61% COMPARISON:  07/14/2016 radiograph, CT scan abdomen pelvis 06/14/2014 FINDINGS: Lower chest: Small bilateral pleural effusions. Hazy atelectasis within the posterior lung bases. Patchy airspace disease in the left lower lobe and lingula, atelectasis versus mild infiltrate. Vague tiny nodular subpleural densities in the lateral aspect of right middle lobe. No significant pericardial effusion. Oval hypodensity posterior to the left atrium slightly increased in size, measuring 1.3 cm. Hepatobiliary: There is mild periportal edema. Mild central intra hepatic biliary dilatation has slightly increased since the previous exam. No focal hepatic mass lesions are identified. Gallbladder is present. No calcified stones. Extrahepatic common bile duct is nondilated. Pancreas: There is been progressive atrophy of the pancreas. Pancreatic duct is prominent and slightly increased in size compared to the previous CT scan. Spleen: Grossly unremarkable  Adrenals/Urinary Tract: Bilateral adrenal glands are within normal limits. Diffuse bilateral renal cortical thickening. No hydronephrosis. Multiple sub cm cortical hypodense lesions, too small to accurately characterize. 4.5 mm stone present in the mid pole of the right kidney. Small hypodense lesion within the lower pole left kidney  measures 1 cm compared to 8.4 mm previously. Bladder is largely obscured by the beam hardening artifact from right hip hardware. Stomach/Bowel: The stomach is markedly dilated. Esophageal tube is present with the tip projecting over the distal stomach. Slightly prominent proximal duodenum with slightly narrowed appearance at third portion. 8 mm soft tissue density, intraluminal within the second portion of the duodenum, series 2, image number 32. The duodenum appears redundant. Contrast is present within nondilated distal small bowel loops. Contrast also present within the right colon. Suggestion of mild wall thickening of the ascending and transverse colon. Vascular/Lymphatic: Atherosclerotic vascular disease of the aorta. Mild irregular mural thickening of the distal descending thoracic aorta. Few sub cm retroperitoneal lymph nodes. Reproductive: Small amount of air in the vagina. Pelvic structures obscured by beam hardening artifact from right hip hardware. Other: There has been interim weight loss. There is a small amount of ascites within the pelvis. Mild hazy attenuation of the mesentery which may be secondary to edema. Additional edema present within the subcutaneous fat. The previously noted mesenteric nodules have decreased. Musculoskeletal: Degenerative changes of the spine, most notable at L4-L5. Status post right hip arthroplasty without dislocation evident. Grossly intact hardware. IMPRESSION: 1. Marked gastric distention, the stomach contains fluid and a small amount of debris. Tip of the esophageal tube is in the distal stomach. 8 mm hypodensity is present within the second portion of the duodenum, this could relate to debris or a small soft tissue mass. There is also slight prominence of the second portion of the duodenum with tapered appearance of the third portion of duodenum, this raises the possibility of SMA syndrome if there has been history of rapid weight loss. 2. Periportal edema. Mild  central intrahepatic biliary dilatation, slightly increased. Pancreatic duct also appears slightly enlarged. Suggest correlation with laboratory values. 3. Small bilateral pleural effusions. Patchy atelectasis versus pneumonitis in the lingula and left lower lobe. 4. Nonobstructing stone in the right kidney. Multiple sub cm cortical hypodensities within both kidneys, many of which are too small to further characterize. 5. Suggestion of mild ascending and transverse colon wall thickening, findings could be secondary to a colitis. 6. Small amount of abdominal pelvic ascites. Diffuse hazy appearance of the subcutaneous fat and mesenteries suggests edema. 7. Atherosclerotic vascular disease of the aorta. Electronically Signed   By: Donavan Foil M.D.   On: 07/15/2016 20:02   Dg Abd Acute W/chest  Result Date: 07/14/2016 CLINICAL DATA:  Diffuse left-sided abdominal pain with bloating, nausea, and vomiting for 2 months. Chronic constipation. EXAM: DG ABDOMEN ACUTE W/ 1V CHEST COMPARISON:  08/03/2014 FINDINGS: The cardiomediastinal silhouette is unchanged and within normal limits. Chronic lung changes are again noted with interstitial coarsening in scattered areas of scarring. No pleural effusion or pneumothorax is identified. There is no evidence of intraperitoneal free air. There is marked distention of the stomach, which contains a large amount of mottled material. Scattered, small air-fluid levels are noted in the bowel including in the descending colon, correlate for diarrheal illness. No bowel dilatation is suggestive of mechanical obstruction is seen. Prior right total hip arthroplasty. Lumbar spondylosis. Pelvic calcifications are similar to the prior study and likely represent phleboliths. IMPRESSION: 1. Marked distention of  the stomach with large amount of debris which may reflect a bezoar and/or gastric outlet obstruction. 2. Chronic lung changes without evidence of acute cardiopulmonary process.  Electronically Signed   By: Logan Bores M.D.   On: 07/14/2016 18:09    Review of Systems  Constitutional: Positive for weight loss (162 down to 95 lbs last year  - 67 lb ). Negative for diaphoresis and malaise/fatigue.  HENT: Negative.   Eyes: Negative.   Respiratory: Negative.   Cardiovascular: Positive for leg swelling (without stockings). Negative for chest pain, palpitations, orthopnea, claudication and PND.  Gastrointestinal: Positive for abdominal pain, diarrhea, nausea and vomiting. Negative for blood in stool, constipation and melena.  Genitourinary: Negative.   Musculoskeletal: Negative.   Skin: Negative.   Neurological: Negative.  Negative for weakness.  Endo/Heme/Allergies: Negative.   Psychiatric/Behavioral: Negative.    Blood pressure (!) 159/89, pulse 74, temperature 97.6 F (36.4 C), temperature source Oral, resp. rate 15, height _0  (1.575 m), weight 54.5 kg (120 lb 2.4 oz), SpO2 100 %. Physical Exam  Constitutional: She is oriented to person, place, and time.  Elderly cachectic woman in no distress.  HENT:  Head: Normocephalic and atraumatic.  Mouth/Throat: No oropharyngeal exudate.  Eyes: Right eye exhibits no discharge. Left eye exhibits no discharge. No scleral icterus.  Neck: Normal range of motion. Neck supple. No JVD present. No tracheal deviation present. No thyromegaly present.  Cardiovascular: Normal rate, regular rhythm and normal heart sounds.   Respiratory: Effort normal and breath sounds normal. No respiratory distress. She has no wheezes. She has no rales.  GI: Soft. Bowel sounds are normal. She exhibits no distension. There is tenderness (she says she has pain since discussing diagnosis, but she does not appear on her exam).  Musculoskeletal: She exhibits no edema or tenderness.  Lymphadenopathy:    She has no cervical adenopathy.  Neurological: She is alert and oriented to person, place, and time. No cranial nerve deficit.  Skin: Skin is warm and  dry. No rash noted. No erythema. No pallor.  Psychiatric: She has a normal mood and affect. Her behavior is normal. Thought content normal.  She tells me they told her she had cancer age 74-30 she isn't sure and since then has been in [employer] and has the gift of hands.  She told me twice she has her mausoleum almost paid for and she is a Do not Wake.      Assessment/Plan: Nausea, vomiting, diarrhea, and abdominal pain Duodenal mass/ partial gastric outlet obstruction Weight loss Reflux esophagitis  Hx of CVA Hx of hypertension off all meds over the last year Osteoarthritis Hyperlipidemia  DNR/DNI  Plan:  I would confine her to clear liquids and await biopsy.  Will discuss options after tissue diagnosis.    Shanikia Kernodle 07/16/2016, 1:56 PM

## 2016-07-16 NOTE — Discharge Summary (Signed)
[facility] Discharge Summary  Patient name: Linda Macias Medical record number: [id number] Date of birth: 12/04/1922 Age: 82 y.o. Gender: female Date of Admission: 07/14/2016  Date of Discharge: 07/25/16 Admitting Physician: Kinnie Feil, MD  Primary Care Provider: Lockie Pares, MD Consultants: GI  Indication for Hospitalization: nausea, vomiting  Discharge Diagnoses/Problem List:  Diarrhea Gastric outlet obstruction, stable HTN Hypokalemia, hypomagnesemia Microcytic anemia LV Diastolic dysfunction History of small intestinal bacterial overgrowth  Disposition: home  Discharge Condition: stable  Discharge Exam:  GEN: NAD, thin elderly female in no acute distress CV: Regular rhythm, no murmurs PULM: CTAB, normal effort ABD: Soft, mild tenderness to palpation in upper quadrants b/l, decreased BS present EXTR: trace edema to knees bilaterally  Brief Hospital Course:  Pt admitted with 3 weeks of nausea and emesis following any meal. No recent weight loss. Abdominal XR consistent with bezoar vs gastric outlet obstruction. GI consulted, pt made NPO and NG placed, putting out >3L brown fluid. Abdominal CT showed possible mass in duodenum, gastric outlet obstruction. EGD showed LA grade B reflux esophagitis, duodenal mass causing obstruction with biopsy taken. Hemoglobin noted to be 7.9 without any signs of active bleeding, transfused 1 unit. Patient declined any surgical interventions at this time. GI considered placing therapeutic stent in duodenum, EGD with [place] scheduled for 9/20. Additional biopsy taken on second EGD, path consistent with duodenitis, no H pylori. Patient developed diarrhea after restarting soft diet. C diff negative. Patient was able to tolerate soft diet without nausea or vomiting, stools improved, patient desired conservative management.  Issues for Follow Up:  1. Check potassium on follow up office visit. 2. Continue to monitor  for nausea or vomiting, which would be concerning for worsening obstruction.  3. Recheck Hgb.   Significant Procedures: EGD x 2, biopsy x2  Significant Labs and Imaging:   Recent Labs Lab 07/23/16 0405 07/24/16 0500 07/25/16 0654  WBC 5.6 6.2 6.4  HGB 9.7* 10.1* 9.3*  HCT 30.1* 31.4* 29.3*  PLT 139* 145* 136*    Recent Labs Lab 07/19/16 0500 07/20/16 0505 07/21/16 0629 07/22/16 0450 07/23/16 0405 07/24/16 0500 07/25/16 0654  NA 138 138 138 140 137 139 142  K 4.0 3.8 3.5 4.0 3.1* 3.1* 2.9*  CL 110 111 111 110 109 116* 120*  CO2 22 24 24 24 23  20* 18*  GLUCOSE 116* 79 75 68 99 80 83  BUN 12 13 10 8 6 6 8   CREATININE 0.76 0.89 0.83 0.87 0.84 0.79 0.80  CALCIUM 7.9* 7.8* 8.0* 8.2* 8.2* 8.6* 8.4*  MG 1.7  --  1.8  --   --   --   --   PHOS 3.1  --   --   --   --   --   --   ALKPHOS 33* 29* 34* 37* 38  --   --   AST 15 13* 17 19 16   --   --   ALT 9* 10* 13* 14 14  --   --   ALBUMIN 2.5* 2.3* 2.4* 2.4* 2.4*  --   --     Results/Tests Pending at Time of Discharge: none  Discharge Medications:    Medication List    STOP taking these medications   ferrous sulfate 325 (65 FE) MG EC tablet     TAKE these medications   diclofenac sodium 1 % Gel Commonly known as:  VOLTAREN Apply 2 g topically 4 (four) times daily.   mometasone 50  MCG/ACT nasal spray Commonly known as:  NASONEX Place 2 sprays into the nose daily.   potassium chloride SA 20 MEQ tablet Commonly known as:  K-DUR,KLOR-CON Take 2 tablets (40 mEq total) by mouth 2 (two) times daily. What changed:  how much to take       Discharge Instructions: Please refer to Patient Instructions section of EMR for full details.  Patient was counseled important signs and symptoms that should prompt return to medical care, changes in medications, dietary instructions, activity restrictions, and follow up appointments.   Follow-Up Appointments: Follow-up Information    Lockie Pares, MD. Go on 07/28/2016.    Specialty:  Family Medicine Why:  Appointment 3:30 PM Contact information: [place] [address] [phone]          Ralene Ok, MD 07/25/2016, 1:34 PM PGY-1, [place]

## 2016-07-16 NOTE — Anesthesia Procedure Notes (Addendum)
Procedure Name: Intubation Date/Time: 07/16/2016 12:39 PM Performed by: Salli Quarry Darrell Hauk Pre-anesthesia Checklist: Patient identified, Emergency Drugs available, Suction available and Patient being monitored Patient Re-evaluated:Patient Re-evaluated prior to inductionOxygen Delivery Method: Circle System Utilized Preoxygenation: Pre-oxygenation with 100% oxygen Intubation Type: IV induction, Rapid sequence and Cricoid Pressure applied Laryngoscope Size: Mac and 4 Grade View: Grade I Tube type: Oral Tube size: 7.5 mm Number of attempts: 1 Airway Equipment and Method: Stylet and Oral airway Placement Confirmation: ETT inserted through vocal cords under direct vision,  positive ETCO2 and breath sounds checked- equal and bilateral Secured at: 21 cm Tube secured with: Tape Dental Injury: Teeth and Oropharynx as per pre-operative assessment

## 2016-07-16 NOTE — Anesthesia Preprocedure Evaluation (Addendum)
Anesthesia Evaluation  Patient identified by MRN, date of birth, ID band Patient awake    Reviewed: Allergy & Precautions, H&P , NPO status , Patient's Chart, lab work & pertinent test results  Airway Mallampati: II  TM Distance: >3 FB Neck ROM: Full    Dental no notable dental hx. (+) Teeth Intact, Dental Advisory Given   Pulmonary neg pulmonary ROS,    Pulmonary exam normal breath sounds clear to auscultation       Cardiovascular hypertension,  Rhythm:Regular Rate:Normal     Neuro/Psych TIA Neuromuscular disease negative psych ROS   GI/Hepatic negative GI ROS, Neg liver ROS,   Endo/Other  negative endocrine ROS  Renal/GU negative Renal ROS  negative genitourinary   Musculoskeletal  (+) Arthritis , Osteoarthritis,    Abdominal   Peds negative pediatric ROS (+)  Hematology negative hematology ROS (+) anemia ,   Anesthesia Other Findings   Reproductive/Obstetrics negative OB ROS                           Anesthesia Physical Anesthesia Plan  ASA: II  Anesthesia Plan: MAC   Post-op Pain Management:    Induction: Intravenous  Airway Management Planned: Nasal Cannula  Additional Equipment:   Intra-op Plan:   Post-operative Plan:   Informed Consent: I have reviewed the patients History and Physical, chart, labs and discussed the procedure including the risks, benefits and alternatives for the proposed anesthesia with the patient or authorized representative who has indicated his/her understanding and acceptance.   Dental advisory given  Plan Discussed with: CRNA  Anesthesia Plan Comments:         Anesthesia Quick Evaluation

## 2016-07-16 NOTE — Op Note (Signed)
[facility] Patient Name: Linda Macias Procedure Date : 07/16/2016 MRN: [id number] Attending MD: Clarene Essex , MD Date of Birth: May 25, 1923 CSN: [id number] Age: 82 Admit Type: Inpatient Procedure:                Upper GI endoscopy Indications:              Abnormal abdominal x-ray of the GI tract, Abnormal                            CT of the GI tract gastric outlet obstruction Providers:                Clarene Essex, MD, Malka So, RN, Elspeth Cho Tech., Technician, Cira Servant, CRNA Referring MD:              Medicines:                General Anesthesia Complications:            No immediate complications. Estimated Blood Loss:     Estimated blood loss: none. Procedure:                Pre-Anesthesia Assessment:                           - Prior to the procedure, a History and Physical                            was performed, and patient medications and                            allergies were reviewed. The patient's tolerance of                            previous anesthesia was also reviewed. The risks                            and benefits of the procedure and the sedation                            options and risks were discussed with the patient.                            All questions were answered, and informed consent                            was obtained. Prior Anticoagulants: The patient has                            taken Lovenox (enoxaparin), last dose was 1 day                            prior to procedure. ASA Grade Assessment: II - A  patient with mild systemic disease. After reviewing                            the risks and benefits, the patient was deemed in                            satisfactory condition to undergo the procedure.                           After obtaining informed consent, the endoscope was                            passed under direct vision. Throughout the                        procedure, the patient's blood pressure, pulse, and                            oxygen saturations were monitored continuously. The                            [id number]) scope was introduced through the                            mouth, and advanced to the duodenal bulb were                            unable to advance further due to a probable mass in                            the distal bulb and biopsies were obtained and then                            we advanced the pediatric endoscope and were able                            to advance to the third part of duodenum. The upper                            GI endoscopy was accomplished without difficulty.                            The patient tolerated the procedure well. Scope In: Scope Out: Findings:      LA Grade B (one or more mucosal breaks greater than 5 mm, not extending       between the tops of two mucosal folds) esophagitis with no bleeding was       found in the distal to mid esophagus.      A medium amount of food (residue) was found in the gastric fundus and on       the greater curvature of the stomach.      A severe Obstruction from probable mass deformity was found in the       duodenal bulb. Biopsies were taken with a  cold forceps for histology.but       unable to advance the regular scope past this area      The second portion of the duodenum and third portion of the duodenum       were normal as seen with the pediatric endoscope.      The exam was otherwise without abnormality. Impression:               - LA Grade B reflux esophagitis.                           - A medium amount of food (residue) in the stomach.                           - Duodenal deformity. Biopsied.                           - Normal second portion of the duodenum and third                            portion of the duodenum.                           - The examination was otherwise normal. Moderate Sedation:       moderate sedation-none Recommendation:           - Patient has a contact number available for                            emergencies. The signs and symptoms of potential                            delayed complications were discussed with the                            patient. Return to normal activities tomorrow.                            Written discharge instructions were provided to the                            patient.                           - NPO indefinitely. except sips meds and ice chips                            and replace NG tube when necessary                           - Continue present medications.                           - Await pathology results. await surgery and if                            needed oncology opinion and  please let me know if                            duodenal stent is needed and happy to place when                            all are in agreement particularly if surgery is not                            to be entertained                           - Return to GI clinic PRN.                           - Telephone GI clinic if symptomatic PRN. Procedure Code(s):        --- Professional ---                           [id number], Esophagogastroduodenoscopy, flexible,                            transoral; with biopsy, single or multiple Diagnosis Code(s):        --- Professional ---                           K21.0, Gastro-esophageal reflux disease with                            esophagitis                           K31.89, Other diseases of stomach and duodenum                           R93.3, Abnormal findings on diagnostic imaging of                            other parts of digestive tract CPT copyright 2016 [employer]. All rights reserved. The codes documented in this report are preliminary and upon coder review may  be revised to meet current compliance requirements. Clarene Essex, MD 07/16/2016 1:12:00 PM This report has been signed  electronically. Number of Addenda: 0

## 2016-07-16 NOTE — Progress Notes (Signed)
Family Medicine Teaching Service Daily Progress Note Intern Pager: [phone]  Patient name: Linda Macias Medical record number: [id number] Date of birth: [birth year] Age: 82 y.o. Gender: female  Primary Care Provider: Lockie Pares, MD Consultants: GI Code Status: DNR  Pt Overview and Major Events to Date:  9/13 admitted with 12m of nausea and NBNB emesis 9/14 GI consulted 9/15 endoscopy scheduled  Assessment and Plan: Linda Macias is a 82 y.o. female presenting with abdominal distention, nausea/vomiting, constipation progressively worsening for 2 months. PMH is significant for small intestinal bacterial overgrowth, chronic diarrhea, HTN (stable off meds), LV Diastolic Dysfunction, IBS, hx of TIA.   Concern for Bezoar vs Gastric Outlet Obstruction: Symptoms of abdominal bloating, gas, post-prandial abdominal pain, N/V for 2 months with worsening symptoms. With symptoms of post-prandial abdominal pain, considered mesenteric ischemia; patient had mesenteric doppler study in February 2016 which did not reveal any significant mesenteric arterial stenosis. Currently no abdominal pain or tenderness to palpation; having normal BMs. Patient's vitals are stable except for mild tachycardia. Her lab work is unremarkable with no leukocytosis. Lipase is wnl. Is followed by [facility] for small intestinal bacterial overgrowth. Per grand-daughter, has never had EGD.  - GI consulted, appreciate recs: endoscopy 9/15, CT abdomen as below (small debris in stomach, small density in duodenum) - NPO with NG tube placement - IV Pepcid while NPO - IVF with D5NS at maintenance (NS @ 90cc/hr)  HTN: Not on medications at home. Currently stable - will monitor  History of Small intestinal bacterial overgrowth: was diagnosed with this about 1 year ago and had associated weight loss. Was on Xifaxan but currently on this. Her chronic diarrhea and weight improved despite being off of Xifaxan. Was seen by GI a few  weeks ago by GI, and granddaughter reports patient was doing well per GI at that time. Currently having soft stools, no diarrhea.   History of Hypokalemia: on Kdur 75meq BID at home. Not on potassium wasting agents at home. Has history of chronic diarrhea,currently resolved. K 3.8 on admit.  - will monitor and repleat as needed.   Microcytic Anemia: hgb 9 on admit. Baseline ~8. No signs of bleeding. Last Colonoscopy in 07/2014 with two polyps with pathology of tubular adenoma and no high grade dysplasia. Per grand-daughter, patient has never had an EGD. Hgb stable at 9.4 9/15. - monitor with labs  Left Ventricular Diastolic Dysfunction: ECHO 08/2015 with EF 55-60%, pseudonormal LV filling pattern with G2DD. Trace pitting edema up to knees. Lungs are clear to auscultation. No dyspnea or orthopnea.  - will monitor  FEN/GI: NPO with NG tube  Prophylaxis: Lovenox  Disposition: continued inpatient management   Subjective:  Doing well, but hungry. Denies any pain.  Objective: Temp:  [98.1 F (36.7 C)-98.2 F (36.8 C)] 98.1 F (36.7 C) (09/15 0555) Pulse Rate:  [67] 67 (09/15 0555) Resp:  [17] 17 (09/15 0555) BP: (152-154)/(78-80) 154/80 (09/15 0555) SpO2:  [87 %-100 %] 87 % (09/15 0555) Physical Exam: GEN: NAD, thin elderly female CV: Regular rhythm, mildly tachycardic, no murmurs, rubs, or gallops PULM: CTAB, normal effort ABD: Soft, mildly distended in LUQ, nontender, + BS SKIN: No rash or cyanosis; warm and well-perfused EXTR: trace edema to knees bilaterally, no calf tenderness; TED hose in place PSYCH: Mood and affect euthymic, normal rate and volume of speech NEURO: Awake, alert, no focal deficits grossly, normal speech  Laboratory:  Recent Labs Lab 07/14/16 1213 07/15/16 0526  WBC 5.6 5.6  HGB  9.6* 9.0*  HCT 29.9* 28.9*  PLT 132* 171    Recent Labs Lab 07/14/16 1213 07/15/16 0526  NA 137 139  K 3.8 3.5  CL 107 109  CO2 22 22  BUN 17 17  CREATININE 0.98  0.97  CALCIUM 9.2 8.4*  PROT 6.3*  --   BILITOT 0.7  --   ALKPHOS 37*  --   ALT 10*  --   AST 16  --   GLUCOSE 86 80    Imaging/Diagnostic Tests: Ct Abdomen Pelvis W Contrast  Result Date: 07/15/2016 CLINICAL DATA:  Gastric outlet obstruction EXAM: CT ABDOMEN AND PELVIS WITH CONTRAST TECHNIQUE: Multidetector CT imaging of the abdomen and pelvis was performed using the standard protocol following bolus administration of intravenous contrast. CONTRAST:  100 mL ISOVUE-300 IOPAMIDOL (ISOVUE-300) INJECTION 61% COMPARISON:  07/14/2016 radiograph, CT scan abdomen pelvis 06/14/2014 FINDINGS: Lower chest: Small bilateral pleural effusions. Hazy atelectasis within the posterior lung bases. Patchy airspace disease in the left lower lobe and lingula, atelectasis versus mild infiltrate. Vague tiny nodular subpleural densities in the lateral aspect of right middle lobe. No significant pericardial effusion. Oval hypodensity posterior to the left atrium slightly increased in size, measuring 1.3 cm. Hepatobiliary: There is mild periportal edema. Mild central intra hepatic biliary dilatation has slightly increased since the previous exam. No focal hepatic mass lesions are identified. Gallbladder is present. No calcified stones. Extrahepatic common bile duct is nondilated. Pancreas: There is been progressive atrophy of the pancreas. Pancreatic duct is prominent and slightly increased in size compared to the previous CT scan. Spleen: Grossly unremarkable Adrenals/Urinary Tract: Bilateral adrenal glands are within normal limits. Diffuse bilateral renal cortical thickening. No hydronephrosis. Multiple sub cm cortical hypodense lesions, too small to accurately characterize. 4.5 mm stone present in the mid pole of the right kidney. Small hypodense lesion within the lower pole left kidney measures 1 cm compared to 8.4 mm previously. Bladder is largely obscured by the beam hardening artifact from right hip hardware.  Stomach/Bowel: The stomach is markedly dilated. Esophageal tube is present with the tip projecting over the distal stomach. Slightly prominent proximal duodenum with slightly narrowed appearance at third portion. 8 mm soft tissue density, intraluminal within the second portion of the duodenum, series 2, image number 32. The duodenum appears redundant. Contrast is present within nondilated distal small bowel loops. Contrast also present within the right colon. Suggestion of mild wall thickening of the ascending and transverse colon. Vascular/Lymphatic: Atherosclerotic vascular disease of the aorta. Mild irregular mural thickening of the distal descending thoracic aorta. Few sub cm retroperitoneal lymph nodes. Reproductive: Small amount of air in the vagina. Pelvic structures obscured by beam hardening artifact from right hip hardware. Other: There has been interim weight loss. There is a small amount of ascites within the pelvis. Mild hazy attenuation of the mesentery which may be secondary to edema. Additional edema present within the subcutaneous fat. The previously noted mesenteric nodules have decreased. Musculoskeletal: Degenerative changes of the spine, most notable at L4-L5. Status post right hip arthroplasty without dislocation evident. Grossly intact hardware. IMPRESSION: 1. Marked gastric distention, the stomach contains fluid and a small amount of debris. Tip of the esophageal tube is in the distal stomach. 8 mm hypodensity is present within the second portion of the duodenum, this could relate to debris or a small soft tissue mass. There is also slight prominence of the second portion of the duodenum with tapered appearance of the third portion of duodenum, this raises the possibility  of SMA syndrome if there has been history of rapid weight loss. 2. Periportal edema. Mild central intrahepatic biliary dilatation, slightly increased. Pancreatic duct also appears slightly enlarged. Suggest correlation with  laboratory values. 3. Small bilateral pleural effusions. Patchy atelectasis versus pneumonitis in the lingula and left lower lobe. 4. Nonobstructing stone in the right kidney. Multiple sub cm cortical hypodensities within both kidneys, many of which are too small to further characterize. 5. Suggestion of mild ascending and transverse colon wall thickening, findings could be secondary to a colitis. 6. Small amount of abdominal pelvic ascites. Diffuse hazy appearance of the subcutaneous fat and mesenteries suggests edema. 7. Atherosclerotic vascular disease of the aorta. Electronically Signed   By: Donavan Foil M.D.   On: 07/15/2016 20:02     Sela Hilding, MD 07/16/2016, 7:31 AM PGY-1, [place] Intern pager: [phone], text pages welcome

## 2016-07-16 NOTE — Transfer of Care (Signed)
Immediate Anesthesia Transfer of Care Note  Patient: Linda Macias  Procedure(s) Performed: Procedure(s): ESOPHAGOGASTRODUODENOSCOPY (EGD) (N/A)  Patient Location: PACU and Endoscopy Unit  Anesthesia Type:General  Level of Consciousness: awake, sedated and patient cooperative  Airway & Oxygen Therapy: Patient Spontanous Breathing and Patient connected to nasal cannula oxygen  Post-op Assessment: Report given to RN, Post -op Vital signs reviewed and stable and Patient moving all extremities  Post vital signs: Reviewed and stable  Last Vitals:  Vitals:   07/16/16 0555 07/16/16 1222  BP: (!) 154/80 96/69  Pulse: 67 (!) 112  Resp: 17 12  Temp: 36.7 C     Last Pain:  Vitals:   07/16/16 0555  TempSrc: Oral  PainSc:       Patients Stated Pain Goal: 2 ([id number])  Complications: No apparent anesthesia complications

## 2016-07-17 DIAGNOSIS — K3189 Other diseases of stomach and duodenum: Secondary | ICD-10-CM

## 2016-07-17 DIAGNOSIS — E440 Moderate protein-calorie malnutrition: Secondary | ICD-10-CM

## 2016-07-17 LAB — TRIGLYCERIDES: Triglycerides: 98 mg/dL (ref <150)

## 2016-07-17 LAB — PREALBUMIN: Prealbumin: 8.5 mg/dL — ABNORMAL LOW (ref 18–38)

## 2016-07-17 LAB — CBC
HCT: 27.8 % — ABNORMAL LOW (ref 36.0–46.0)
Hemoglobin: 8.8 g/dL — ABNORMAL LOW (ref 12.0–15.0)
MCH: 23.6 pg — ABNORMAL LOW (ref 26.0–34.0)
MCHC: 31.7 g/dL (ref 30.0–36.0)
MCV: 74.5 fL — ABNORMAL LOW (ref 78.0–100.0)
Platelets: 191 10*3/uL (ref 150–400)
RBC: 3.73 MIL/uL — ABNORMAL LOW (ref 3.87–5.11)
RDW: 14.1 % (ref 11.5–15.5)
WBC: 5.1 10*3/uL (ref 4.0–10.5)

## 2016-07-17 LAB — DIFFERENTIAL
Basophils Absolute: 0 10*3/uL (ref 0.0–0.1)
Basophils Relative: 0 %
Eosinophils Absolute: 0.1 10*3/uL (ref 0.0–0.7)
Eosinophils Relative: 1 %
Lymphocytes Relative: 28 %
Lymphs Abs: 1.4 10*3/uL (ref 0.7–4.0)
Monocytes Absolute: 0.5 10*3/uL (ref 0.1–1.0)
Monocytes Relative: 10 %
Neutro Abs: 3.1 10*3/uL (ref 1.7–7.7)
Neutrophils Relative %: 61 %

## 2016-07-17 LAB — COMPREHENSIVE METABOLIC PANEL
ALT: 9 U/L — ABNORMAL LOW (ref 14–54)
AST: 14 U/L — ABNORMAL LOW (ref 15–41)
Albumin: 2.4 g/dL — ABNORMAL LOW (ref 3.5–5.0)
Alkaline Phosphatase: 30 U/L — ABNORMAL LOW (ref 38–126)
Anion gap: 6 (ref 5–15)
BUN: 13 mg/dL (ref 6–20)
CO2: 23 mmol/L (ref 22–32)
Calcium: 8.1 mg/dL — ABNORMAL LOW (ref 8.9–10.3)
Chloride: 112 mmol/L — ABNORMAL HIGH (ref 101–111)
Creatinine, Ser: 0.93 mg/dL (ref 0.44–1.00)
GFR calc Af Amer: >60 mL/min (ref >60)
GFR calc non Af Amer: 53 mL/min — ABNORMAL LOW (ref >60)
Glucose, Bld: 86 mg/dL (ref 65–99)
Potassium: 3.2 mmol/L — ABNORMAL LOW (ref 3.5–5.1)
Sodium: 141 mmol/L (ref 135–145)
Total Bilirubin: 0.6 mg/dL (ref 0.3–1.2)
Total Protein: 4.2 g/dL — ABNORMAL LOW (ref 6.5–8.1)

## 2016-07-17 LAB — GLUCOSE, CAPILLARY: Glucose-Capillary: 110 mg/dL — ABNORMAL HIGH (ref 65–99)

## 2016-07-17 LAB — MAGNESIUM: Magnesium: 1.5 mg/dL — ABNORMAL LOW (ref 1.7–2.4)

## 2016-07-17 LAB — PHOSPHORUS: Phosphorus: 2.6 mg/dL (ref 2.5–4.6)

## 2016-07-17 MED ORDER — SODIUM CHLORIDE 0.9% FLUSH
10.0000 mL | INTRAVENOUS | Status: DC | PRN
Start: 2016-07-17 — End: 2016-07-25
  Administered 2016-07-18 – 2016-07-19 (×3): 10 mL
  Administered 2016-07-22 – 2016-07-23 (×2): 20 mL
  Filled 2016-07-17 (×5): qty 40

## 2016-07-17 MED ORDER — INSULIN ASPART 100 UNIT/ML ~~LOC~~ SOLN
0.0000 [IU] | Freq: Four times a day (QID) | SUBCUTANEOUS | Status: DC
  Administered 2016-07-18: 2 [IU] via SUBCUTANEOUS

## 2016-07-17 MED ORDER — FAT EMULSION 20 % IV EMUL
240.0000 mL | INTRAVENOUS | Status: DC
  Administered 2016-07-17: 240 mL via INTRAVENOUS
  Filled 2016-07-17: qty 250

## 2016-07-17 MED ORDER — SODIUM CHLORIDE 0.9% FLUSH
10.0000 mL | Freq: Two times a day (BID) | INTRAVENOUS | Status: DC
  Administered 2016-07-18 – 2016-07-22 (×3): 10 mL

## 2016-07-17 MED ORDER — TRACE MINERALS CR-CU-MN-SE-ZN 10-1000-500-60 MCG/ML IV SOLN
INTRAVENOUS | Status: DC
  Administered 2016-07-17: 18:00:00 via INTRAVENOUS
  Filled 2016-07-17: qty 720

## 2016-07-17 MED ORDER — POTASSIUM PHOSPHATES 15 MMOLE/5ML IV SOLN
10.0000 mmol | Freq: Once | INTRAVENOUS | Status: AC
  Administered 2016-07-17: 10 mmol via INTRAVENOUS
  Filled 2016-07-17: qty 3.33

## 2016-07-17 MED ORDER — POTASSIUM CHLORIDE 10 MEQ/50ML IV SOLN
10.0000 meq | INTRAVENOUS | Status: AC
  Administered 2016-07-17 (×2): 10 meq via INTRAVENOUS
  Filled 2016-07-17 (×2): qty 50

## 2016-07-17 MED ORDER — BOOST / RESOURCE BREEZE PO LIQD
1.0000 | Freq: Two times a day (BID) | ORAL | Status: DC
Start: 2016-07-17 — End: 2016-07-25
  Administered 2016-07-17 – 2016-07-25 (×10): 1 via ORAL

## 2016-07-17 MED ORDER — MAGNESIUM SULFATE 2 GM/50ML IV SOLN
2.0000 g | Freq: Once | INTRAVENOUS | Status: AC
  Administered 2016-07-17: 2 g via INTRAVENOUS
  Filled 2016-07-17: qty 50

## 2016-07-17 NOTE — Progress Notes (Signed)
[facility] Gastroenterology Progress Note  Linda Macias 82 y.o. 27-Jan-1923  C/C : f/up for duodenal bulb mass   Subjective: Patient is status post EGD yesterday showing a duodenal bulb mass causing severe obstruction. Denied any vomiting today. Able to drink some water without any problem. Denied abdominal pain.  Objective: Vital signs in last 24 hours: Vitals:   07/16/16 1502 07/16/16 2140  BP: (!) 162/77 92/65  Pulse: 61 (!) 122  Resp: 18 18  Temp: 98.4 F (36.9 C) 98.4 F (36.9 C)    Physical Exam:  General:  Alert, cooperative, Elderly not in acute distress   Head:  Normocephalic, without obvious abnormality, atraumatic  Eyes:  , EOM's intact,   Lungs:   Clear to auscultation bilaterally, respirations unlabored  Heart:  Regular rate and rhythm,   Abdomen:   Soft, non-tender, bowel sounds active all four quadrants,  no masses,   Extremities: Extremities normal, atraumatic, no  edema       Lab Results:  Recent Labs  07/16/16 0628 07/17/16 0504  NA 138 141  K 3.4* 3.2*  CL 113* 112*  CO2 20* 23  GLUCOSE 129* 86  BUN 16 13  CREATININE 0.91 0.93  CALCIUM 8.1* 8.1*  MG  --  1.5*  PHOS  --  2.6    Recent Labs  07/14/16 1213 07/17/16 0504  AST 16 14*  ALT 10* 9*  ALKPHOS 37* 30*  BILITOT 0.7 0.6  PROT 6.3* 4.2*  ALBUMIN 3.8 2.4*    Recent Labs  07/16/16 0628 07/17/16 0504  WBC 5.4 5.1  NEUTROABS  --  3.1  HGB 9.4* 8.8*  HCT 29.3* 27.8*  MCV 74.9* 74.5*  PLT 130* 191   No results for input(s): LABPROT, INR in the last 72 hours.    Assessment/Plan: - Duodenal bulb mass causing outlet obstruction  Recommendations -------------------------- - Follow biopsy results. - Trial of clear liquid diet - Patient does not want any surgical procedure but she is  open for possible duodenal stent. - GI will follow.  Linda Macias 07/17/2016, 9:16 AM  Pager [phone]  If no answer or after 5 PM call [phone]

## 2016-07-17 NOTE — Progress Notes (Signed)
PARENTERAL NUTRITION CONSULT NOTE - INITIAL  Pharmacy Consult for TPN Indication: SBO  Allergies  Allergen Reactions  . Ciprofloxacin Other (See Comments)    Diarrhea for 37 days  . Flagyl Er [Metronidazole]     Diarrhea     Patient Measurements: Height: 5\' 2"  (157.5 cm) Weight: 120 lb 2.4 oz (54.5 kg) IBW/kg (Calculated) : 50.1   Vital Signs: Temp: 98.4 F (36.9 C) (09/15 2140) Temp Source: Oral (09/15 2140) BP: 92/65 (09/15 2140) Pulse Rate: 122 (09/15 2140) Intake/Output from previous day: 09/15 0701 - 09/16 0700 In: 400 [I.V.:350; IV Piggyback:50] Out: 1120 [id number]; Emesis/NG output:50] Intake/Output from this shift: No intake/output data recorded.  Labs:  Recent Labs  07/15/16 0526 07/16/16 0628 07/17/16 0504  WBC 5.6 5.4 5.1  HGB 9.0* 9.4* 8.8*  HCT 28.9* 29.3* 27.8*  PLT 171 130* 191     Recent Labs  07/14/16 1213 07/15/16 0526 07/16/16 0628  NA 137 139 138  K 3.8 3.5 3.4*  CL 107 109 113*  CO2 22 22 20*  GLUCOSE 86 80 129*  BUN 17 17 16   CREATININE 0.98 0.97 0.91  CALCIUM 9.2 8.4* 8.1*  PROT 6.3*  --   --   ALBUMIN 3.8  --   --   AST 16  --   --   ALT 10*  --   --   ALKPHOS 37*  --   --   BILITOT 0.7  --   --    Estimated Creatinine Clearance: 32.5 mL/min (by C-G formula based on SCr of 0.91 mg/dL).   No results for input(s): GLUCAP in the last 72 hours.  Medical History: Past Medical History:  Diagnosis Date  . Arthritis   . Cancer ([place])   . CVA (cerebral infarction)    No residuals, cannot tolerate statins   . Hyperlipidemia   . Hypertension   . Osteoarthritis, knee/lower leg     Insulin Requirements in the past 24 hours:  None  Current Nutrition:  NPO  Assessment: 82 yo F presents on 9/13 after about 2 weeks of vomiting after every meal and abdominal pain with distension and chronic diarrhea. Has a PMH significant for  She reports reduced appetite and losing a couple pounds over the last few months. Xray showed  bezoar vs GOO. GI consulted. Pharmacy consulted to start TPN for SBO.  GI: Possible GOO - NPO, starting TPN. Pre-albumin low at 8.5. Zofran prn for nausea- required overnight. Pepcid IV >> will place in TPN today. No current plans for surgery. Awaiting biopsy reports.  Endo: BG wnl. No current SSI.  Lytes: K 3.2, Mg 1.5, Phos 2.6. CoCa 9.4.  Renal: SCr 0.93 -stable. CrCl ~25-30 mL/min. No current IV fluids. UOP good at 0.8 cc//kg/hr. Net -2.4L for admission.  Pulm: RA Cards: VSS Hepatobil: LFTs/Tbili wnl. TG wnl.  Neuro: AA&O ID: WBC wnl, afebrile.  Best Practices: Lovenox  TPN Access: PICC 9/16 TPN start date: 9/16 >>  Nutritional Goals:  Follow up RD recs  Plan:  Initiate Clinimix E 5/15 at 30 mL/hr + 20% lipid emulsion at 10 mL/hr.  Add multivitamin and trace elements to TPN.  Discontinue IV Pepcid and add into TPN bag.  Initiate sensitive SSI q6h while on TPN.  Give KPhos 10 mmol IV x1 today.  Give additional KCl runs x2 today.  Give magnesium sulfate 2g IV today.  Follow-up CMET, Magnesium, and Phosphorus in AM.  Monitor CBGs on TPN.    Sloan Leiter, PharmD, BCPS  Clinical Pharmacist [phone] 07/17/2016 7:06 AM

## 2016-07-17 NOTE — Progress Notes (Signed)
Family Medicine Teaching Service Daily Progress Note Intern Pager: [phone]  Patient name: Linda Macias Medical record number: [id number] Date of birth: [birth year] Age: 82 y.o. Gender: female  Primary Care Provider: Lockie Pares, MD Consultants: GI Code Status: DNR  Pt Overview and Major Events to Date:  9/13 admitted with 76m of nausea and NBNB emesis 9/14 GI consulted 9/15 endoscopy with duodenal mass biopsy sent for analysis  Assessment and Plan: Linda Macias is a 82 y.o. female presenting with abdominal distention, nausea/vomiting, constipation progressively worsening for 2 months. PMH is significant for small intestinal bacterial overgrowth, chronic diarrhea, HTN (stable off meds), LV Diastolic Dysfunction, IBS, hx of TIA.   Gastric Outlet Obstruction: Endoscopy completed yesterday with biopsy of a duodenal mass that is suspicious for malignancy. She is adamant in not wishing to pursue surgery for this, and seems heavily influenced by previous medical experiences and her 3 siblings passing away from cancer decades ago. She was tolerating a trial of clear liquids this morning without significant pain or vomiting. - Follow up pathology results for eventual planning - Continue liquid diet as tolerating - PICC placed to start TPN today PM for nutritional support - Replacing potassium via IV as needed - Repeat labs in AM - GI recommendations appreciated  HTN: Not on medications at home. Currently stable - will monitor  History of Small intestinal bacterial overgrowth: was diagnosed with this about 1 year ago and had associated weight loss. Was on Xifaxan but currently on this. Her chronic diarrhea and weight improved despite being off of Xifaxan. Was seen by GI a few weeks ago by GI, and granddaughter reports patient was doing well per GI at that time. Currently having soft stools, no diarrhea.   History of Hypokalemia: on Kdur 19meq BID at home. Not on potassium wasting  agents at home. Has history of chronic diarrhea,currently resolved. K 3.8 on admit.  - will monitor and repleat as needed.   Microcytic Anemia: hgb 9 on admit. Baseline ~8. No signs of bleeding. Last Colonoscopy in 07/2014 with two polyps with pathology of tubular adenoma and no high grade dysplasia. Hgb stable at 9.4 9/15. - monitor with labs  Left Ventricular Diastolic Dysfunction: ECHO 08/2015 with EF 55-60%, pseudonormal LV filling pattern with G2DD. Trace pitting edema up to knees. Lungs are clear to auscultation. No dyspnea or orthopnea.  - will monitor  FEN/GI: Liquids by mouth, TPN via PICC  Prophylaxis: Lovenox  Disposition: continued inpatient management   Subjective:  She was very positive this morning and tolerating some water and jello without difficulty.  Objective: Temp:  [98 F (36.7 C)-98.4 F (36.9 C)] 98 F (36.7 C) (09/16 1355) Pulse Rate:  [67-122] 67 (09/16 1355) Resp:  [18] 18 (09/16 1355) BP: (92-161)/(65-75) 161/75 (09/16 1355) SpO2:  [100 %] 100 % (09/16 1355) Physical Exam: GEN: NAD, thin elderly female in no acute distress CV: Regular rhythm, no murmurs PULM: CTAB, normal effort ABD: Soft, nontender EXTR: trace edema to knees bilaterally   Laboratory:  Recent Labs Lab 07/15/16 0526 07/16/16 0628 07/17/16 0504  WBC 5.6 5.4 5.1  HGB 9.0* 9.4* 8.8*  HCT 28.9* 29.3* 27.8*  PLT 171 130* 191    Recent Labs Lab 07/14/16 1213 07/15/16 0526 07/16/16 0628 07/17/16 0504  NA 137 139 138 141  K 3.8 3.5 3.4* 3.2*  CL 107 109 113* 112*  CO2 22 22 20* 23  BUN 17 17 16 13   CREATININE 0.98 0.97 0.91 0.93  CALCIUM 9.2 8.4* 8.1* 8.1*  PROT 6.3*  --   --  4.2*  BILITOT 0.7  --   --  0.6  ALKPHOS 37*  --   --  30*  ALT 10*  --   --  9*  AST 16  --   --  14*  GLUCOSE 86 80 129* 86    Imaging/Diagnostic Tests: No results found.   Collier Salina, MD PGY-II Internal Medicine Resident Pager# [phone] 07/17/2016, 4:05 PM

## 2016-07-17 NOTE — Progress Notes (Signed)
Peripherally Inserted Central Catheter/Midline Placement  The IV Nurse has discussed with the patient and/or persons authorized to consent for the patient, the purpose of this procedure and the potential benefits and risks involved with this procedure.  The benefits include less needle sticks, lab draws from the catheter, and the patient may be discharged home with the catheter. Risks include, but not limited to, infection, bleeding, blood clot (thrombus formation), and puncture of an artery; nerve damage and irregular heartbeat and possibility to perform a PICC exchange if needed/ordered by physician.  Alternatives to this procedure were also discussed.  Bard Power PICC patient education guide, fact sheet on infection prevention and patient information card has been provided to patient /or left at bedside.    PICC/Midline Placement Documentation  PICC Double Lumen 07/17/16 PICC Right Brachial 34 cm 0 cm (Active)  Indication for Insertion or Continuance of Line Administration of hyperosmolar/irritating solutions (i.e. TPN, Vancomycin, etc.) 07/17/2016  8:26 AM  Exposed Catheter (cm) 0 cm 07/17/2016  8:26 AM  Site Assessment Clean;Dry;Intact 07/17/2016  8:26 AM  Lumen #1 Status Flushed;Saline locked;Blood return noted 07/17/2016  8:26 AM  Lumen #2 Status Flushed;Saline locked;Blood return noted 07/17/2016  8:26 AM  Dressing Type Transparent 07/17/2016  8:26 AM  Dressing Status Clean;Dry;Intact 07/17/2016  8:26 AM  Dressing Change Due 07/24/16 07/17/2016  8:26 AM       Gordan Payment 07/17/2016, 8:33 AM

## 2016-07-17 NOTE — Progress Notes (Signed)
1 Day Post-Op  Subjective: No complaints today, about to do clears  Objective: Vital signs in last 24 hours: Temp:  [97.6 F (36.4 C)-98.4 F (36.9 C)] 98.4 F (36.9 C) (09/15 2140) Pulse Rate:  [59-122] 122 (09/15 2140) Resp:  [12-18] 18 (09/15 2140) BP: (92-183)/(65-97) 92/65 (09/15 2140) SpO2:  [97 %-100 %] 100 % (09/15 2140) Last BM Date: 07/14/16  Intake/Output from previous day: 09/15 0701 - 09/16 0700 In: 400 [I.V.:350; IV Piggyback:50] Out: 1120 [Urine:1070; Emesis/NG output:50] Intake/Output this shift: Total I/O In: 240 [P.O.:240] Out: -   GI soft nt  Lab Results:   Recent Labs  07/16/16 0628 07/17/16 0504  WBC 5.4 5.1  HGB 9.4* 8.8*  HCT 29.3* 27.8*  PLT 130* 191   BMET  Recent Labs  07/16/16 0628 07/17/16 0504  NA 138 141  K 3.4* 3.2*  CL 113* 112*  CO2 20* 23  GLUCOSE 129* 86  BUN 16 13  CREATININE 0.91 0.93  CALCIUM 8.1* 8.1*   PT/INR No results for input(s): LABPROT, INR in the last 72 hours. ABG No results for input(s): PHART, HCO3 in the last 72 hours.  Invalid input(s): PCO2, PO2  Studies/Results: Ct Abdomen Pelvis W Contrast  Result Date: 07/15/2016 CLINICAL DATA:  Gastric outlet obstruction EXAM: CT ABDOMEN AND PELVIS WITH CONTRAST TECHNIQUE: Multidetector CT imaging of the abdomen and pelvis was performed using the standard protocol following bolus administration of intravenous contrast. CONTRAST:  100 mL ISOVUE-300 IOPAMIDOL (ISOVUE-300) INJECTION 61% COMPARISON:  07/14/2016 radiograph, CT scan abdomen pelvis 06/14/2014 FINDINGS: Lower chest: Small bilateral pleural effusions. Hazy atelectasis within the posterior lung bases. Patchy airspace disease in the left lower lobe and lingula, atelectasis versus mild infiltrate. Vague tiny nodular subpleural densities in the lateral aspect of right middle lobe. No significant pericardial effusion. Oval hypodensity posterior to the left atrium slightly increased in size, measuring 1.3 cm.  Hepatobiliary: There is mild periportal edema. Mild central intra hepatic biliary dilatation has slightly increased since the previous exam. No focal hepatic mass lesions are identified. Gallbladder is present. No calcified stones. Extrahepatic common bile duct is nondilated. Pancreas: There is been progressive atrophy of the pancreas. Pancreatic duct is prominent and slightly increased in size compared to the previous CT scan. Spleen: Grossly unremarkable Adrenals/Urinary Tract: Bilateral adrenal glands are within normal limits. Diffuse bilateral renal cortical thickening. No hydronephrosis. Multiple sub cm cortical hypodense lesions, too small to accurately characterize. 4.5 mm stone present in the mid pole of the right kidney. Small hypodense lesion within the lower pole left kidney measures 1 cm compared to 8.4 mm previously. Bladder is largely obscured by the beam hardening artifact from right hip hardware. Stomach/Bowel: The stomach is markedly dilated. Esophageal tube is present with the tip projecting over the distal stomach. Slightly prominent proximal duodenum with slightly narrowed appearance at third portion. 8 mm soft tissue density, intraluminal within the second portion of the duodenum, series 2, image number 32. The duodenum appears redundant. Contrast is present within nondilated distal small bowel loops. Contrast also present within the right colon. Suggestion of mild wall thickening of the ascending and transverse colon. Vascular/Lymphatic: Atherosclerotic vascular disease of the aorta. Mild irregular mural thickening of the distal descending thoracic aorta. Few sub cm retroperitoneal lymph nodes. Reproductive: Small amount of air in the vagina. Pelvic structures obscured by beam hardening artifact from right hip hardware. Other: There has been interim weight loss. There is a small amount of ascites within the pelvis. Mild hazy attenuation  of the mesentery which may be secondary to edema.  Additional edema present within the subcutaneous fat. The previously noted mesenteric nodules have decreased. Musculoskeletal: Degenerative changes of the spine, most notable at L4-L5. Status post right hip arthroplasty without dislocation evident. Grossly intact hardware. IMPRESSION: 1. Marked gastric distention, the stomach contains fluid and a small amount of debris. Tip of the esophageal tube is in the distal stomach. 8 mm hypodensity is present within the second portion of the duodenum, this could relate to debris or a small soft tissue mass. There is also slight prominence of the second portion of the duodenum with tapered appearance of the third portion of duodenum, this raises the possibility of SMA syndrome if there has been history of rapid weight loss. 2. Periportal edema. Mild central intrahepatic biliary dilatation, slightly increased. Pancreatic duct also appears slightly enlarged. Suggest correlation with laboratory values. 3. Small bilateral pleural effusions. Patchy atelectasis versus pneumonitis in the lingula and left lower lobe. 4. Nonobstructing stone in the right kidney. Multiple sub cm cortical hypodensities within both kidneys, many of which are too small to further characterize. 5. Suggestion of mild ascending and transverse colon wall thickening, findings could be secondary to a colitis. 6. Small amount of abdominal pelvic ascites. Diffuse hazy appearance of the subcutaneous fat and mesenteries suggests edema. 7. Atherosclerotic vascular disease of the aorta. Electronically Signed   By: Donavan Foil M.D.   On: 07/15/2016 20:02    Anti-infectives: Anti-infectives    None      Assessment/Plan: Duodenal obstruction  Does not want surgery, possible stent Await pathology  [facility] 07/17/2016

## 2016-07-17 NOTE — Progress Notes (Signed)
Initial Nutrition Assessment  DOCUMENTATION CODES:   Non-severe (moderate) malnutrition in context of chronic illness  INTERVENTION:  Monitor magnesium, potassium, and phosphorus daily for at least 3 days, MD to replete as needed, as pt is at risk for refeeding syndrome given emesis and Poor PO for >/= 7 days.  TPN per pharmacy; recommend meeting >/= 90% estimated energy and protein needs Provide Boost Breeze po BID, each supplement provides 250 kcal and 9 grams of protein   NUTRITION DIAGNOSIS:   Malnutrition related to nausea, vomiting, altered GI function as evidenced by severe depletion of muscle mass, moderate depletion of body fat.   GOAL:   Patient will meet greater than or equal to 90% of their needs   MONITOR:   Diet advancement, Skin, I & O's, Labs, Weight trends  REASON FOR ASSESSMENT:   Consult New TPN/TNA  ASSESSMENT:   82 y.o. female presenting with abdominal distention, nausea/vomiting, constipation progressively worsening for 2 months. PMH is significant for small intestinal bacterial overgrowth, chronic diarrhea, HTN (stable off meds), LV Diastolic Dysfunction, IBS, hx of TIA.   Pt reports that 2 years ago she had ongoing vomiting and diarrhea from foodborne illness. She reports losing from 162 lbs down to 95 lbs with recent gain back to 120 lbs from eating well. She reports eating poorly for the past 2 months due to nausea and vomiting after eating. No evidence of weight loss in the past 2 months per weight history. Pt has severe muscle wasting and moderate fat wasting per nutrition-focused physical exam. She tolerating some clear liquids at lunch. She states she has had peach Boost Breeze in the past and that she likes it and tolerated it well.   Labs: low hemoglobin, low magnesium, low potassium, low calcium  Diet Order:  Diet clear liquid Room service appropriate? Yes; Fluid consistency: Thin TPN (CLINIMIX-E) Adult  Skin:  Reviewed, no issues  Last  BM:  9/13  Height:   Ht Readings from Last 1 Encounters:  07/14/16 5\' 2"  (1.575 m)    Weight:   Wt Readings from Last 1 Encounters:  07/14/16 120 lb 2.4 oz (54.5 kg)    Ideal Body Weight:  50 kg  BMI:  Body mass index is 21.98 kg/m.  Estimated Nutritional Needs:   Kcal:  1300-1500  Protein:  70-80 grams  Fluid:  1.6 L/day  EDUCATION NEEDS:   No education needs identified at this time  Scarlette Ar RD, LDN, CSP Inpatient Clinical Dietitian Pager: [phone] After Hours Pager: [phone]

## 2016-07-17 NOTE — Progress Notes (Signed)
Pt is able to tolerate some clear liquids, she likes peach boost, campbells chicken noodle soup without the noodles, lemon ice and grape juice.  No vomiting at present.

## 2016-07-18 LAB — PHOSPHORUS: Phosphorus: 2.7 mg/dL (ref 2.5–4.6)

## 2016-07-18 LAB — COMPREHENSIVE METABOLIC PANEL
ALT: 9 U/L — ABNORMAL LOW (ref 14–54)
AST: 15 U/L (ref 15–41)
Albumin: 2.4 g/dL — ABNORMAL LOW (ref 3.5–5.0)
Alkaline Phosphatase: 31 U/L — ABNORMAL LOW (ref 38–126)
Anion gap: 5 (ref 5–15)
BUN: 10 mg/dL (ref 6–20)
CO2: 26 mmol/L (ref 22–32)
Calcium: 8.2 mg/dL — ABNORMAL LOW (ref 8.9–10.3)
Chloride: 108 mmol/L (ref 101–111)
Creatinine, Ser: 0.88 mg/dL (ref 0.44–1.00)
GFR calc Af Amer: >60 mL/min (ref >60)
GFR calc non Af Amer: 56 mL/min — ABNORMAL LOW (ref >60)
Glucose, Bld: 107 mg/dL — ABNORMAL HIGH (ref 65–99)
Potassium: 3.3 mmol/L — ABNORMAL LOW (ref 3.5–5.1)
Sodium: 139 mmol/L (ref 135–145)
Total Bilirubin: 0.2 mg/dL — ABNORMAL LOW (ref 0.3–1.2)
Total Protein: 4.4 g/dL — ABNORMAL LOW (ref 6.5–8.1)

## 2016-07-18 LAB — GLUCOSE, CAPILLARY
Glucose-Capillary: 109 mg/dL — ABNORMAL HIGH (ref 65–99)
Glucose-Capillary: 113 mg/dL — ABNORMAL HIGH (ref 65–99)
Glucose-Capillary: 120 mg/dL — ABNORMAL HIGH (ref 65–99)
Glucose-Capillary: 160 mg/dL — ABNORMAL HIGH (ref 65–99)

## 2016-07-18 LAB — MAGNESIUM: Magnesium: 1.8 mg/dL (ref 1.7–2.4)

## 2016-07-18 MED ORDER — INSULIN ASPART 100 UNIT/ML ~~LOC~~ SOLN: 0.0000 [IU] | Freq: Three times a day (TID) | SUBCUTANEOUS | Status: DC

## 2016-07-18 MED ORDER — POTASSIUM CHLORIDE 10 MEQ/50ML IV SOLN
10.0000 meq | INTRAVENOUS | Status: AC
Start: 2016-07-18 — End: 2016-07-18
  Administered 2016-07-18 (×2): 10 meq via INTRAVENOUS
  Filled 2016-07-18 (×2): qty 50

## 2016-07-18 MED ORDER — POTASSIUM PHOSPHATES 15 MMOLE/5ML IV SOLN
10.0000 mmol | Freq: Once | INTRAVENOUS | Status: AC
  Administered 2016-07-18: 10 mmol via INTRAVENOUS
  Filled 2016-07-18: qty 3.33

## 2016-07-18 NOTE — Progress Notes (Signed)
Family Medicine Teaching Service Daily Progress Note Intern Pager: [phone]  Patient name: Linda Macias Medical record number: [id number] Date of birth: [birth year] Age: 82 y.o. Gender: female  Primary Care Provider: Lockie Pares, MD Consultants: GI Code Status: DNR  Pt Overview and Major Events to Date:  9/13 admitted with 6m of nausea and NBNB emesis 9/14 GI consulted 9/15 endoscopy with duodenal mass biopsy sent for analysis  Assessment and Plan: Linda Macias is a 82 y.o. female presenting with abdominal distention, nausea/vomiting, constipation progressively worsening for 2 months. PMH is significant for small intestinal bacterial overgrowth, chronic diarrhea, HTN (stable off meds), LV Diastolic Dysfunction, IBS, hx of TIA.   Gastric Outlet Obstruction: Duodenal mass biopsy results remain pending. She has tolerated a surprisingly good amount of liquids PO yesterday and this morning. She did have one episode of abdominal pain and vomiting and is mildly tender on exam. This is concerning she could be reacumulating gastric contents so we will need to monitor closely if she starts to worsen. However this is significant improvement over 1 day so I will hold off continuing TPN until we see what direction her oral intake goes. - Follow up pathology results for eventual planning - Continue liquid diet as tolerating - Hold off TPN today, maintain PICC as she is likely to still need supplementation - Replacing lytes intravenously - Repeat labs in AM - GI recommendations appreciated  HTN: Not on medications at home. Currently stable - will monitor  Hypokalemia, hypomagnesemia: K 3.3 today. We will supplement via IV instead of challenging her more PO. Mg 1.8. - will monitor and replacing as needed.  Microcytic Anemia: hgb 9 on admit. Baseline ~8. No signs of bleeding. Last Colonoscopy in 07/2014 with two polyps with pathology of tubular adenoma and no high grade dysplasia. Hgb stable  today. - monitor CBC  Left Ventricular Diastolic Dysfunction: ECHO 08/2015 with EF 55-60%, pseudonormal LV filling pattern with G2DD. Trace pitting edema up to knees. Lungs are clear to auscultation. No dyspnea or orthopnea.  - will monitor  History of Small intestinal bacterial overgrowth: was diagnosed with this about 1 year ago and had associated weight loss. Was on Xifaxan but currently off this. Her chronic diarrhea and weight improved despite being off of Xifaxan. Was seen by GI a few weeks ago by GI, and granddaughter reports patient was doing well per GI at that time. Currently having soft stools, no diarrhea.    FEN/GI: Liquids by mouth, holding TPN today Prophylaxis: Lovenox  Disposition: continued inpatient management   Subjective:  She was eating several soups, juices, and jello this morning. She reports keeping down most of what she tried yesterday but had an episodes of emesis in the evening.  Objective: Temp:  [97.9 F (36.6 C)-98 F (36.7 C)] 97.9 F (36.6 C) (09/17 0452) Pulse Rate:  [67-74] 74 (09/17 0452) Resp:  [17-18] 17 (09/17 0452) BP: (141-161)/(75-84) 143/84 (09/17 0452) SpO2:  [100 %] 100 % (09/17 0452) Physical Exam: GEN: NAD, thin elderly female in no acute distress CV: Regular rhythm, no murmurs PULM: CTAB, normal effort ABD: Soft, mild tenderness to palpation in upper quadrants b/l, decreased BS present EXTR: trace edema to knees bilaterally   Laboratory:  Recent Labs Lab 07/15/16 0526 07/16/16 0628 07/17/16 0504  WBC 5.6 5.4 5.1  HGB 9.0* 9.4* 8.8*  HCT 28.9* 29.3* 27.8*  PLT 171 130* 191    Recent Labs Lab 07/14/16 1213  07/16/16 0628 07/17/16 0504 07/18/16 0530  NA 137  < > 138 141 139  K 3.8  < > 3.4* 3.2* 3.3*  CL 107  < > 113* 112* 108  CO2 22  < > 20* 23 26  BUN 17  < > 16 13 10   CREATININE 0.98  < > 0.91 0.93 0.88  CALCIUM 9.2  < > 8.1* 8.1* 8.2*  PROT 6.3*  --   --  4.2* 4.4*  BILITOT 0.7  --   --  0.6 0.2*   ALKPHOS 37*  --   --  30* 31*  ALT 10*  --   --  9* 9*  AST 16  --   --  14* 15  GLUCOSE 86  < > 129* 86 107*  < > = values in this interval not displayed.  Imaging/Diagnostic Tests: No results found.   Collier Salina, MD PGY-II Internal Medicine Resident Pager# [phone] 07/18/2016, 12:28 PM

## 2016-07-18 NOTE — Progress Notes (Signed)
Pt does not wish to have surgery for her duodenal obstruction.  Will sign off.  Please call us with any questions or if something changes.    Rosario Adie, MD  [facility]

## 2016-07-18 NOTE — Progress Notes (Signed)
[facility] Gastroenterology Progress Note  Linda Macias 82 y.o. 12-27-1922  C/C : f/up for duodenal bulb mass   Subjective:  Doing better. Able tolerate clear liquids. Denied nausea, vomiting.   Objective: Vital signs in last 24 hours: Vitals:   07/17/16 2131 07/18/16 0452  BP: (!) 141/75 (!) 143/84  Pulse: 69 74  Resp: 17 17  Temp: 98 F (36.7 C) 97.9 F (36.6 C)    Physical Exam:  General:  Alert, cooperative, Elderly not in acute distress   Head:  Normocephalic, without obvious abnormality, atraumatic  Eyes:  , EOM's intact,   Lungs:   Clear to auscultation bilaterally, respirations unlabored  Heart:  Regular rate and rhythm,   Abdomen:   Soft, non-tender, bowel sounds active all four quadrants,  no masses,   Extremities: Extremities normal, atraumatic, no  edema       Lab Results:  Recent Labs  07/17/16 0504 07/18/16 0530  NA 141 139  K 3.2* 3.3*  CL 112* 108  CO2 23 26  GLUCOSE 86 107*  BUN 13 10  CREATININE 0.93 0.88  CALCIUM 8.1* 8.2*  MG 1.5* 1.8  PHOS 2.6 2.7    Recent Labs  07/17/16 0504 07/18/16 0530  AST 14* 15  ALT 9* 9*  ALKPHOS 30* 31*  BILITOT 0.6 0.2*  PROT 4.2* 4.4*  ALBUMIN 2.4* 2.4*    Recent Labs  07/16/16 0628 07/17/16 0504  WBC 5.4 5.1  NEUTROABS  --  3.1  HGB 9.4* 8.8*  HCT 29.3* 27.8*  MCV 74.9* 74.5*  PLT 130* 191   No results for input(s): LABPROT, INR in the last 72 hours.    Assessment/Plan: - Duodenal bulb mass causing outlet obstruction  Recommendations -------------------------- - Follow biopsy results. - continue  clear liquid diet - Patient does not want any surgical procedure but she is  open for possible duodenal stent. Need to D/W daughter tomorrow for possible stent placement on Tuesday  - GI will follow.  Otis Brace MD. [name]  07/18/2016, 9:24 AM  Pager [phone]  If no answer or after 5 PM call [phone]

## 2016-07-18 NOTE — Progress Notes (Signed)
PARENTERAL NUTRITION CONSULT NOTE - FOLLOW UP  Pharmacy Consult for TPN Indication: SBO  Allergies  Allergen Reactions  . Ciprofloxacin Other (See Comments)    Diarrhea for 37 days  . Flagyl Er [Metronidazole]     Diarrhea     Patient Measurements: Height: 5\' 2"  (157.5 cm) Weight: 120 lb 2.4 oz (54.5 kg) IBW/kg (Calculated) : 50.1 Vital Signs: Temp: 97.9 F (36.6 C) (09/17 0452) Temp Source: Oral (09/17 0452) BP: 143/84 (09/17 0452) Pulse Rate: 74 (09/17 0452) Intake/Output from previous day: 09/16 0701 - 09/17 0700 In: 1353.3 [P.O.:1000; IV Piggyback:353.3] Out: 600 [Urine:600] Intake/Output from this shift: No intake/output data recorded.  Labs:  Recent Labs  07/16/16 0628 07/17/16 0504  WBC 5.4 5.1  HGB 9.4* 8.8*  HCT 29.3* 27.8*  PLT 130* 191     Recent Labs  07/16/16 0628 07/17/16 0504 07/18/16 0530  NA 138 141 139  K 3.4* 3.2* 3.3*  CL 113* 112* 108  CO2 20* 23 26  GLUCOSE 129* 86 107*  BUN 16 13 10   CREATININE 0.91 0.93 0.88  CALCIUM 8.1* 8.1* 8.2*  MG  --  1.5* 1.8  PHOS  --  2.6 2.7  PROT  --  4.2* 4.4*  ALBUMIN  --  2.4* 2.4*  AST  --  14* 15  ALT  --  9* 9*  ALKPHOS  --  30* 31*  BILITOT  --  0.6 0.2*  PREALBUMIN  --  8.5*  --   TRIG  --  98  --    Estimated Creatinine Clearance: 33.6 mL/min (by C-G formula based on SCr of 0.88 mg/dL).    Recent Labs  07/17/16 1802 07/18/16 0000 07/18/16 0636  GLUCAP 110* 160* 120*   Insulin Requirements in the past 24 hours:  2 units of sensitive SSI  Assessment: 82 yo F presents on 9/13 after about 2 weeks of vomiting after every meal and abdominal pain with distension and chronic diarrhea. Has a PMH significant for  She reports reduced appetite and losing a couple pounds over the last few months. Xray showed bezoar vs GOO. GI consulted. Pharmacy consulted to start TPN for SBO.  GI: Possible GOO - NPO, starting TPN. Pre-albumin low at 8.5. Zofran prn for nausea-little emesis last night.  Pepcid IV >> will place in TPN today. No current plans for surgery. Awaiting biopsy reports. Initiated on clear liquids- tolerating. 75% of meal yesterday.  Endo: BG wnl. No current SSI.  Lytes: K 3.3, Mg 1.8, Phos 2.7. CoCa 9.4.  Renal: SCr 0.88 -stable. CrCl ~30-35 mL/min. No current IV fluids. UOP ok at 0.5 cc//kg/hr. Net + 750 mL last 24 hours.  Pulm: RA Cards: VSS Hepatobil: LFTs/Tbili wnl. TG wnl.  Neuro: AA&O ID: WBC wnl, afebrile.  Best Practices: Lovenox  TPN Access: PICC 9/16 TPN start date: 9/16 >>  Current Nutrition:  TNA Clear liquid diet Boost BID between meals (provides 250 kcal and 9g protein per carton)  Nutritional Goals:  1300-1500 kCal, 70-80 grams of protein per day  Plan:  Discussed case with Dr. Benjamine Mola (Family Medicine). Will discontinue TPN today and trial po toleration. However, low threshold to resume TPN tomorrow. TPN only at 1/2 goal rate now, ok to discontinue. MD to replace lytes per our discussion.   Will keep TPN labs in place for now in case of restart.  Will change SSI to [facility] for now.  Pharmacy will continue to monitor per protocol.   Sloan Leiter, PharmD, BCPS Clinical  Pharmacist [phone] 07/18/2016,9:46 AM

## 2016-07-19 ENCOUNTER — Encounter (HOSPITAL_COMMUNITY): Payer: Self-pay | Admitting: Gastroenterology

## 2016-07-19 LAB — COMPREHENSIVE METABOLIC PANEL
ALT: 9 U/L — ABNORMAL LOW (ref 14–54)
AST: 15 U/L (ref 15–41)
Albumin: 2.5 g/dL — ABNORMAL LOW (ref 3.5–5.0)
Alkaline Phosphatase: 33 U/L — ABNORMAL LOW (ref 38–126)
Anion gap: 6 (ref 5–15)
BUN: 12 mg/dL (ref 6–20)
CO2: 22 mmol/L (ref 22–32)
Calcium: 7.9 mg/dL — ABNORMAL LOW (ref 8.9–10.3)
Chloride: 110 mmol/L (ref 101–111)
Creatinine, Ser: 0.76 mg/dL (ref 0.44–1.00)
GFR calc Af Amer: >60 mL/min (ref >60)
GFR calc non Af Amer: >60 mL/min (ref >60)
Glucose, Bld: 116 mg/dL — ABNORMAL HIGH (ref 65–99)
Potassium: 4 mmol/L (ref 3.5–5.1)
Sodium: 138 mmol/L (ref 135–145)
Total Bilirubin: 0.2 mg/dL — ABNORMAL LOW (ref 0.3–1.2)
Total Protein: 4.5 g/dL — ABNORMAL LOW (ref 6.5–8.1)

## 2016-07-19 LAB — DIFFERENTIAL
Basophils Absolute: 0 10*3/uL (ref 0.0–0.1)
Basophils Relative: 0 %
Eosinophils Absolute: 0 10*3/uL (ref 0.0–0.7)
Eosinophils Relative: 1 %
Lymphocytes Relative: 22 %
Lymphs Abs: 1.1 10*3/uL (ref 0.7–4.0)
Monocytes Absolute: 0.3 10*3/uL (ref 0.1–1.0)
Monocytes Relative: 7 %
Neutro Abs: 3.4 10*3/uL (ref 1.7–7.7)
Neutrophils Relative %: 70 %

## 2016-07-19 LAB — GLUCOSE, CAPILLARY
Glucose-Capillary: 107 mg/dL — ABNORMAL HIGH (ref 65–99)
Glucose-Capillary: 94 mg/dL (ref 65–99)
Glucose-Capillary: 83 mg/dL (ref 65–99)
Glucose-Capillary: 80 mg/dL (ref 65–99)
Glucose-Capillary: 90 mg/dL (ref 65–99)

## 2016-07-19 LAB — CBC
HCT: 28.6 % — ABNORMAL LOW (ref 36.0–46.0)
Hemoglobin: 9.2 g/dL — ABNORMAL LOW (ref 12.0–15.0)
MCH: 23.9 pg — ABNORMAL LOW (ref 26.0–34.0)
MCHC: 32.2 g/dL (ref 30.0–36.0)
MCV: 74.3 fL — ABNORMAL LOW (ref 78.0–100.0)
Platelets: 138 10*3/uL — ABNORMAL LOW (ref 150–400)
RBC: 3.85 MIL/uL — ABNORMAL LOW (ref 3.87–5.11)
RDW: 14.5 % (ref 11.5–15.5)
WBC: 4.8 10*3/uL (ref 4.0–10.5)

## 2016-07-19 LAB — PHOSPHORUS: Phosphorus: 3.1 mg/dL (ref 2.5–4.6)

## 2016-07-19 LAB — MAGNESIUM: Magnesium: 1.7 mg/dL (ref 1.7–2.4)

## 2016-07-19 LAB — TRIGLYCERIDES: Triglycerides: 114 mg/dL (ref <150)

## 2016-07-19 LAB — PREALBUMIN: Prealbumin: 9.2 mg/dL — ABNORMAL LOW (ref 18–38)

## 2016-07-19 MED ORDER — MAGNESIUM SULFATE 2 GM/50ML IV SOLN
2.0000 g | Freq: Once | INTRAVENOUS | Status: AC
  Administered 2016-07-19: 2 g via INTRAVENOUS
  Filled 2016-07-19: qty 50

## 2016-07-19 MED ORDER — SODIUM CHLORIDE 0.9 % IV SOLN: INTRAVENOUS | Status: DC

## 2016-07-19 NOTE — Progress Notes (Signed)
PARENTERAL NUTRITION CONSULT NOTE - FOLLOW UP  Pharmacy Consult: TPN Indication: SBO  Allergies  Allergen Reactions  . Ciprofloxacin Other (See Comments)    Diarrhea for 37 days  . Flagyl Er [Metronidazole]     Diarrhea     Patient Measurements: Height: 5' 2"  (157.5 cm) Weight: 120 lb 2.4 oz (54.5 kg) IBW/kg (Calculated) : 50.1   Vital Signs: Temp: 98.2 F (36.8 C) (09/18 0520) Temp Source: Oral (09/18 0520) BP: 85/61 (09/18 0520) Pulse Rate: 115 (09/18 0520) Intake/Output from previous day: 09/17 0701 - 09/18 0700 In: 943.3 [P.O.:540; IV Piggyback:403.3] Out: 1 [Stool:1] Intake/Output from this shift: No intake/output data recorded.  Labs:  Recent Labs  07/17/16 0504 07/19/16 0500  WBC 5.1 4.8  HGB 8.8* 9.2*  HCT 27.8* 28.6*  PLT 191 PENDING     Recent Labs  07/17/16 0504 07/18/16 0530 07/19/16 0500  NA 141 139 138  K 3.2* 3.3* 4.0  CL 112* 108 110  CO2 23 26 22   GLUCOSE 86 107* 116*  BUN 13 10 12   CREATININE 0.93 0.88 0.76  CALCIUM 8.1* 8.2* 7.9*  MG 1.5* 1.8 1.7  PHOS 2.6 2.7 3.1  PROT 4.2* 4.4* 4.5*  ALBUMIN 2.4* 2.4* 2.5*  AST 14* 15 15  ALT 9* 9* 9*  ALKPHOS 30* 31* 33*  BILITOT 0.6 0.2* 0.2*  PREALBUMIN 8.5*  --  9.2*  TRIG 98  --  114   Estimated Creatinine Clearance: 37 mL/min (by C-G formula based on SCr of 0.76 mg/dL).    Recent Labs  07/18/16 1256 07/18/16 1802 07/19/16 0518  GLUCAP 113* 109* 107*    Insulin Requirements in the past 24 hours:  2 units of sensitive SSI  Assessment: 89 YOF presented on 07/14/16 after about 2 weeks of vomiting after every meal and abdominal pain with distension and chronic diarrhea.  She reports reduced appetite and losing a couple pounds over the last few months.  Xray showed bezoar vs GOO.  Pharmacy consulted to manage TPN for SBO, which was then placed on hold as patient was tolerating clears.  Patient reported eating 80% of her meals.  GI: possible GOO - prealbumin 8.5 > 9.2.   Patient does not want surgery; awaiting biopsy reports.  Pepcid, BM x1, emesis x1 (PRN Zofran available) Endo: CBGs controlled Lytes: all WNL Renal: SCr 0.76 stable Pulm: stable on RA Cards: BP low, tachy  Hepatobil: alk phos mildly elevated, others WNL, TG WNL Neuro: Voltaren gel ID: afebrile, WBC WNL Best Practices: Lovenox  TPN Access: PICC 07/17/16 TPN start date: 9/16 >> 9/17  Current Nutrition:  Clear liquid diet Boost BIDBM (received 1 = 250 kCal and 9g protein)  Nutritional Goals:  1300-1500 kCal, 70-80 grams of protein per day   Plan:  - D/C TPN consult and Pharmacy will sign off.  Please reconsult if needed. - D/C TPN labs and nursing care orders   Genavive Kubicki D. Mina Marble, PharmD, BCPS Pager:  [phone] 07/19/2016, 8:49 AM

## 2016-07-19 NOTE — Progress Notes (Signed)
Family Medicine Teaching Service Daily Progress Note Intern Pager: [phone]  Patient name: Linda Macias Medical record number: [id number] Date of birth: 01-Nov-1923 Age: 82 y.o. Gender: female  Primary Care Provider: Lockie Pares, MD Consultants: GI Code Status: DNR  Pt Overview and Major Events to Date:  9/13 admitted with 73m of nausea and NBNB emesis 9/14 GI consulted 9/15 endoscopy with duodenal mass biopsy sent for analysis  Assessment and Plan: Linda Macias is a 82 y.o. female presenting with abdominal distention, nausea/vomiting, constipation progressively worsening for 2 months. PMH is significant for small intestinal bacterial overgrowth, chronic diarrhea, HTN (stable off meds), LV Diastolic Dysfunction, IBS, hx of TIA.   Gastric Outlet Obstruction: Duodenal mass biopsy results remain pending. She is tolerating PO clears, did have one episode of vomiting last night. - Follow up pathology results for eventual planning, patient has declined surgery at this point but is open to having a stent placed - Continue liquid diet as tolerating - Monitor for worsening abdominal pain or vomiting as she could be re accumulating gastric contents - Hold off TPN, maintain PICC as she is likely to still need supplementation - Replacing lytes intravenously, potassium up to 4 today, phos 3.1, mag 1.7 - Continue following CMP - GI recommendations appreciated  HTN: Not on medications at home. Currently stable - will monitor, was hypotensive this am but is without symptoms  Hypokalemia, hypomagnesemia: K 4.0 today. We will supplement via IV instead of challenging her more PO. Mg 1.7.  - will monitor and replace as needed - 2g magnesium today  - hold off on replacing additional potassium today  Microcytic Anemia: hgb 9 on admit. Baseline ~8. No signs of bleeding. Last Colonoscopy in 07/2014 with two polyps with pathology of tubular adenoma and no high grade dysplasia. Hgb stable today at  9.2.  - monitor CBC  Left Ventricular Diastolic Dysfunction: ECHO 08/2015 with EF 55-60%, pseudonormal LV filling pattern with G2DD. Trace pitting edema up to knees. Lungs are clear to auscultation. No dyspnea or orthopnea.  - will monitor  History of Small intestinal bacterial overgrowth: was diagnosed with this about 1 year ago and had associated weight loss. Was on Xifaxan but currently off this. Her chronic diarrhea and weight improved despite being off of Xifaxan. Was seen by GI a few weeks ago, and granddaughter reports patient was doing well per GI at that time. Currently having soft stools, no diarrhea.   FEN/GI: Liquids by mouth, holding TPN today Prophylaxis: Lovenox  Disposition: continued inpatient management   Subjective:  Linda Macias is doing well this morning, she is about to take some clears this morning, denies abdominal pain. Had a bowel movement this morning. Wants to get stent placed.  Objective: Temp:  [98 F (36.7 C)-98.7 F (37.1 C)] 98.2 F (36.8 C) (09/18 0520) Pulse Rate:  [64-125] 115 (09/18 0520) Resp:  [16-18] 16 (09/18 0520) BP: (85-152)/(61-78) 85/61 (09/18 0520) SpO2:  [100 %] 100 % (09/18 0520) Physical Exam: GEN: NAD, thin elderly female in no acute distress CV: Regular rhythm, no murmurs PULM: CTAB, normal effort ABD: Soft, mild tenderness to palpation in upper quadrants b/l, decreased BS present EXTR: trace edema to knees bilaterally   Laboratory:  Recent Labs Lab 07/15/16 0526 07/16/16 0628 07/17/16 0504  WBC 5.6 5.4 5.1  HGB 9.0* 9.4* 8.8*  HCT 28.9* 29.3* 27.8*  PLT 171 130* 191    Recent Labs Lab 07/14/16 1213  07/16/16 0628 07/17/16 0504 07/18/16 0530  NA 137  < > 138 141 139  K 3.8  < > 3.4* 3.2* 3.3*  CL 107  < > 113* 112* 108  CO2 22  < > 20* 23 26  BUN 17  < > 16 13 10   CREATININE 0.98  < > 0.91 0.93 0.88  CALCIUM 9.2  < > 8.1* 8.1* 8.2*  PROT 6.3*  --   --  4.2* 4.4*  BILITOT 0.7  --   --  0.6 0.2*  ALKPHOS 37*   --   --  30* 31*  ALT 10*  --   --  9* 9*  AST 16  --   --  14* 15  GLUCOSE 86  < > 129* 86 107*  < > = values in this interval not displayed.  Imaging/Diagnostic Tests: No results found. Ct Abdomen Pelvis W Contrast  Result Date: 07/15/2016 CLINICAL DATA:  Gastric outlet obstruction EXAM: CT ABDOMEN AND PELVIS WITH CONTRAST TECHNIQUE: Multidetector CT imaging of the abdomen and pelvis was performed using the standard protocol following bolus administration of intravenous contrast. CONTRAST:  100 mL ISOVUE-300 IOPAMIDOL (ISOVUE-300) INJECTION 61% COMPARISON:  07/14/2016 radiograph, CT scan abdomen pelvis 06/14/2014 FINDINGS: Lower chest: Small bilateral pleural effusions. Hazy atelectasis within the posterior lung bases. Patchy airspace disease in the left lower lobe and lingula, atelectasis versus mild infiltrate. Vague tiny nodular subpleural densities in the lateral aspect of right middle lobe. No significant pericardial effusion. Oval hypodensity posterior to the left atrium slightly increased in size, measuring 1.3 cm. Hepatobiliary: There is mild periportal edema. Mild central intra hepatic biliary dilatation has slightly increased since the previous exam. No focal hepatic mass lesions are identified. Gallbladder is present. No calcified stones. Extrahepatic common bile duct is nondilated. Pancreas: There is been progressive atrophy of the pancreas. Pancreatic duct is prominent and slightly increased in size compared to the previous CT scan. Spleen: Grossly unremarkable Adrenals/Urinary Tract: Bilateral adrenal glands are within normal limits. Diffuse bilateral renal cortical thickening. No hydronephrosis. Multiple sub cm cortical hypodense lesions, too small to accurately characterize. 4.5 mm stone present in the mid pole of the right kidney. Small hypodense lesion within the lower pole left kidney measures 1 cm compared to 8.4 mm previously. Bladder is largely obscured by the beam hardening  artifact from right hip hardware. Stomach/Bowel: The stomach is markedly dilated. Esophageal tube is present with the tip projecting over the distal stomach. Slightly prominent proximal duodenum with slightly narrowed appearance at third portion. 8 mm soft tissue density, intraluminal within the second portion of the duodenum, series 2, image number 32. The duodenum appears redundant. Contrast is present within nondilated distal small bowel loops. Contrast also present within the right colon. Suggestion of mild wall thickening of the ascending and transverse colon. Vascular/Lymphatic: Atherosclerotic vascular disease of the aorta. Mild irregular mural thickening of the distal descending thoracic aorta. Few sub cm retroperitoneal lymph nodes. Reproductive: Small amount of air in the vagina. Pelvic structures obscured by beam hardening artifact from right hip hardware. Other: There has been interim weight loss. There is a small amount of ascites within the pelvis. Mild hazy attenuation of the mesentery which may be secondary to edema. Additional edema present within the subcutaneous fat. The previously noted mesenteric nodules have decreased. Musculoskeletal: Degenerative changes of the spine, most notable at L4-L5. Status post right hip arthroplasty without dislocation evident. Grossly intact hardware. IMPRESSION: 1. Marked gastric distention, the stomach contains fluid and a small amount of debris. Tip of the  esophageal tube is in the distal stomach. 8 mm hypodensity is present within the second portion of the duodenum, this could relate to debris or a small soft tissue mass. There is also slight prominence of the second portion of the duodenum with tapered appearance of the third portion of duodenum, this raises the possibility of SMA syndrome if there has been history of rapid weight loss. 2. Periportal edema. Mild central intrahepatic biliary dilatation, slightly increased. Pancreatic duct also appears slightly  enlarged. Suggest correlation with laboratory values. 3. Small bilateral pleural effusions. Patchy atelectasis versus pneumonitis in the lingula and left lower lobe. 4. Nonobstructing stone in the right kidney. Multiple sub cm cortical hypodensities within both kidneys, many of which are too small to further characterize. 5. Suggestion of mild ascending and transverse colon wall thickening, findings could be secondary to a colitis. 6. Small amount of abdominal pelvic ascites. Diffuse hazy appearance of the subcutaneous fat and mesenteries suggests edema. 7. Atherosclerotic vascular disease of the aorta. Electronically Signed   By: Donavan Foil M.D.   On: 07/15/2016 20:02     Lucila Maine, [place], PGY-1 [facility] Intern pager: [phone], text pages welcome 07/19/2016, 7:25 AM

## 2016-07-19 NOTE — Care Management Important Message (Signed)
Important Message  Patient Details  Name: Linda Macias MRN: [id number] Date of Birth: [birth year]   Medicare Important Message Given:  Yes    Omir Cooprider Montine Circle 07/19/2016, 11:45 AM

## 2016-07-19 NOTE — Progress Notes (Signed)
[facility] Progress Note  Subjective: Feels okay, vomited some last night  Objective: Vital signs in last 24 hours: Temp:  [98 F (36.7 C)-98.7 F (37.1 C)] 98.2 F (36.8 C) (09/18 0520) Pulse Rate:  [64-125] 115 (09/18 0520) Resp:  [16-18] 16 (09/18 0520) BP: (85-152)/(61-78) 85/61 (09/18 0520) SpO2:  [100 %] 100 % (09/18 0520) Weight change:    PE: Unchanged  Lab Results: Results for orders placed or performed during the hospital encounter of 07/14/16 (from the past 24 hour(s))  Glucose, capillary     Status: Abnormal   Collection Time: 07/18/16 12:56 PM  Result Value Ref Range   Glucose-Capillary 113 (H) 65 - 99 mg/dL  Glucose, capillary     Status: Abnormal   Collection Time: 07/18/16  6:02 PM  Result Value Ref Range   Glucose-Capillary 109 (H) 65 - 99 mg/dL  Comprehensive metabolic panel     Status: Abnormal   Collection Time: 07/19/16  5:00 AM  Result Value Ref Range   Sodium 138 135 - 145 mmol/L   Potassium 4.0 3.5 - 5.1 mmol/L   Chloride 110 101 - 111 mmol/L   CO2 22 22 - 32 mmol/L   Glucose, Bld 116 (H) 65 - 99 mg/dL   BUN 12 6 - 20 mg/dL   Creatinine, Ser 0.76 0.44 - 1.00 mg/dL   Calcium 7.9 (L) 8.9 - 10.3 mg/dL   Total Protein 4.5 (L) 6.5 - 8.1 g/dL   Albumin 2.5 (L) 3.5 - 5.0 g/dL   AST 15 15 - 41 U/L   ALT 9 (L) 14 - 54 U/L   Alkaline Phosphatase 33 (L) 38 - 126 U/L   Total Bilirubin 0.2 (L) 0.3 - 1.2 mg/dL   GFR calc non Af Amer >60 >60 mL/min   GFR calc Af Amer >60 >60 mL/min   Anion gap 6 5 - 15  Magnesium     Status: None   Collection Time: 07/19/16  5:00 AM  Result Value Ref Range   Magnesium 1.7 1.7 - 2.4 mg/dL  Phosphorus     Status: None   Collection Time: 07/19/16  5:00 AM  Result Value Ref Range   Phosphorus 3.1 2.5 - 4.6 mg/dL  CBC     Status: Abnormal   Collection Time: 07/19/16  5:00 AM  Result Value Ref Range   WBC 4.8 4.0 - 10.5 K/uL   RBC 3.85 (L) 3.87 - 5.11 MIL/uL   Hemoglobin 9.2 (L) 12.0 - 15.0 g/dL   HCT  28.6 (L) 36.0 - 46.0 %   MCV 74.3 (L) 78.0 - 100.0 fL   MCH 23.9 (L) 26.0 - 34.0 pg   MCHC 32.2 30.0 - 36.0 g/dL   RDW 14.5 11.5 - 15.5 %   Platelets 138 (L) 150 - 400 K/uL  Differential     Status: None   Collection Time: 07/19/16  5:00 AM  Result Value Ref Range   Neutrophils Relative % 70 %   Lymphocytes Relative 22 %   Monocytes Relative 7 %   Eosinophils Relative 1 %   Basophils Relative 0 %   Neutro Abs 3.4 1.7 - 7.7 K/uL   Lymphs Abs 1.1 0.7 - 4.0 K/uL   Monocytes Absolute 0.3 0.1 - 1.0 K/uL   Eosinophils Absolute 0.0 0.0 - 0.7 K/uL   Basophils Absolute 0.0 0.0 - 0.1 K/uL   RBC Morphology SCHISTOCYTES NOTED ON SMEAR   Triglycerides     Status: None   Collection Time: 07/19/16  5:00 AM  Result Value Ref Range   Triglycerides 114 <150 mg/dL  Prealbumin     Status: Abnormal   Collection Time: 07/19/16  5:00 AM  Result Value Ref Range   Prealbumin 9.2 (L) 18 - 38 mg/dL  Glucose, capillary     Status: Abnormal   Collection Time: 07/19/16  5:18 AM  Result Value Ref Range   Glucose-Capillary 107 (H) 65 - 99 mg/dL  Glucose, capillary     Status: None   Collection Time: 07/19/16 10:08 AM  Result Value Ref Range   Glucose-Capillary 94 65 - 99 mg/dL   Comment 1 Notify RN     Studies/Results: No results found.    Assessment: Duodenal obstruction, nature unclear biopsies pending  Plan: Scheduled for EGD with duodenal stent placement tomorrow. She wished to proceed.    Rollen Selders C 07/19/2016, 10:42 AM  Pager [phone] If no answer or after 5 PM call [phone]

## 2016-07-20 DIAGNOSIS — T189XXD Foreign body of alimentary tract, part unspecified, subsequent encounter: Secondary | ICD-10-CM

## 2016-07-20 LAB — CBC
HCT: 24.5 % — ABNORMAL LOW (ref 36.0–46.0)
Hemoglobin: 7.9 g/dL — ABNORMAL LOW (ref 12.0–15.0)
MCH: 24 pg — ABNORMAL LOW (ref 26.0–34.0)
MCHC: 32.2 g/dL (ref 30.0–36.0)
MCV: 74.5 fL — ABNORMAL LOW (ref 78.0–100.0)
Platelets: 131 10*3/uL — ABNORMAL LOW (ref 150–400)
RBC: 3.29 MIL/uL — ABNORMAL LOW (ref 3.87–5.11)
RDW: 14.5 % (ref 11.5–15.5)
WBC: 3.9 10*3/uL — ABNORMAL LOW (ref 4.0–10.5)

## 2016-07-20 LAB — COMPREHENSIVE METABOLIC PANEL
ALT: 10 U/L — ABNORMAL LOW (ref 14–54)
AST: 13 U/L — ABNORMAL LOW (ref 15–41)
Albumin: 2.3 g/dL — ABNORMAL LOW (ref 3.5–5.0)
Alkaline Phosphatase: 29 U/L — ABNORMAL LOW (ref 38–126)
Anion gap: 3 — ABNORMAL LOW (ref 5–15)
BUN: 13 mg/dL (ref 6–20)
CO2: 24 mmol/L (ref 22–32)
Calcium: 7.8 mg/dL — ABNORMAL LOW (ref 8.9–10.3)
Chloride: 111 mmol/L (ref 101–111)
Creatinine, Ser: 0.89 mg/dL (ref 0.44–1.00)
GFR calc Af Amer: >60 mL/min (ref >60)
GFR calc non Af Amer: 55 mL/min — ABNORMAL LOW (ref >60)
Glucose, Bld: 79 mg/dL (ref 65–99)
Potassium: 3.8 mmol/L (ref 3.5–5.1)
Sodium: 138 mmol/L (ref 135–145)
Total Bilirubin: 0.7 mg/dL (ref 0.3–1.2)
Total Protein: 4 g/dL — ABNORMAL LOW (ref 6.5–8.1)

## 2016-07-20 LAB — GLUCOSE, CAPILLARY
Glucose-Capillary: 80 mg/dL (ref 65–99)
Glucose-Capillary: 80 mg/dL (ref 65–99)
Glucose-Capillary: 74 mg/dL (ref 65–99)
Glucose-Capillary: 58 mg/dL — ABNORMAL LOW (ref 65–99)
Glucose-Capillary: 87 mg/dL (ref 65–99)
Glucose-Capillary: 96 mg/dL (ref 65–99)

## 2016-07-20 LAB — PREPARE RBC (CROSSMATCH)

## 2016-07-20 MED ORDER — SODIUM CHLORIDE 0.9 % IV SOLN
Freq: Once | INTRAVENOUS | Status: AC
  Administered 2016-07-20: 17:00:00 via INTRAVENOUS

## 2016-07-20 NOTE — Progress Notes (Signed)
Family Medicine Teaching Service Daily Progress Note Intern Pager: [phone]  Patient name: Linda Macias Medical record number: [id number] Date of birth: [birth year] Age: 82 y.o. Gender: female  Primary Care Provider: Lockie Pares, MD Consultants: GI Code Status: DNR  Pt Overview and Major Events to Date:  9/13 admitted with 59m of nausea and NBNB emesis 9/14 GI consulted 9/15 endoscopy with duodenal mass biopsy sent for analysis 9/18 path without malignancy > duodenitis  Assessment and Plan: Linda Macias is a 82 y.o. female presenting with abdominal distention, nausea/vomiting, constipation progressively worsening for 2 months. PMH is significant for small intestinal bacterial overgrowth, chronic diarrhea, HTN (stable off meds), LV Diastolic Dysfunction, IBS, hx of TIA.   Gastric Outlet Obstruction: Duodenal mass biopsy results remain pending. She is tolerating PO clears, did have one episode of vomiting last night. - Follow up pathology results for eventual planning, patient has declined surgery at this point but is open to having a stent placed - Continue liquid diet as tolerating - Monitor for worsening abdominal pain or vomiting as she could be re accumulating gastric contents - Hold off TPN, maintain PICC as she is likely to still need supplementation - Replacing lytes intravenously, potassium up to 4 today, phos 3.1, mag 1.7 - Continue following CMP - GI recommendations appreciated  HTN: Not on medications at home. Currently stable - will monitor, was hypotensive this am but is without symptoms  Hypokalemia, hypomagnesemia: K 4.0 today. We will supplement via IV instead of challenging her more PO.  - will monitor and replace as needed  Microcytic Anemia: hgb 9 on admit. Baseline ~8. No signs of bleeding. Last Colonoscopy in 07/2014 with two polyps with pathology of tubular adenoma and no high grade dysplasia. Hgb stable today at 9.2.  - monitor CBC  Left  Ventricular Diastolic Dysfunction: ECHO 08/2015 with EF 55-60%, pseudonormal LV filling pattern with G2DD. Trace pitting edema up to knees. Lungs are clear to auscultation. No dyspnea or orthopnea.  - will monitor  History of Small intestinal bacterial overgrowth: was diagnosed with this about 1 year ago and had associated weight loss. Was on Xifaxan but currently off this. Her chronic diarrhea and weight improved despite being off of Xifaxan. Was seen by GI a few weeks ago, and granddaughter reports patient was doing well per GI at that time. Currently having soft stools, no diarrhea.   FEN/GI: Liquids by mouth, holding TPN today Prophylaxis: Lovenox  Disposition: continued inpatient management   Subjective:  In great spirits today, requesting a steak.   Objective: Temp:  [98.4 F (36.9 C)-98.8 F (37.1 C)] 98.8 F (37.1 C) (09/19 0519) Pulse Rate:  [65-77] 65 (09/19 0519) Resp:  [16-17] 16 (09/19 0519) BP: (120-149)/(68-87) 121/68 (09/19 0519) SpO2:  [100 %] 100 % (09/19 0519) Physical Exam: GEN: NAD, thin elderly female in no acute distress CV: Regular rhythm, no murmurs PULM: CTAB, normal effort ABD: Soft, mild tenderness to palpation in upper quadrants b/l, decreased BS present EXTR: trace edema to knees bilaterally   Laboratory:  Recent Labs Lab 07/17/16 0504 07/19/16 0500 07/20/16 0505  WBC 5.1 4.8 3.9*  HGB 8.8* 9.2* 7.9*  HCT 27.8* 28.6* 24.5*  PLT 191 138* 131*    Recent Labs Lab 07/18/16 0530 07/19/16 0500 07/20/16 0505  NA 139 138 138  K 3.3* 4.0 3.8  CL 108 110 111  CO2 26 22 24   BUN 10 12 13   CREATININE 0.88 0.76 0.89  CALCIUM 8.2* 7.9* 7.8*  PROT 4.4* 4.5* 4.0*  BILITOT 0.2* 0.2* 0.7  ALKPHOS 31* 33* 29*  ALT 9* 9* 10*  AST 15 15 13*  GLUCOSE 107* 116* 79    Imaging/Diagnostic Tests: No results found. No results found.   Ralene Ok, MD [place], PGY-1 [place] Intern pager: [phone], text pages  welcome 07/20/2016, 9:59 AM

## 2016-07-20 NOTE — Progress Notes (Signed)
Nutrition Follow-up  DOCUMENTATION CODES:   Non-severe (moderate) malnutrition in context of chronic illness  INTERVENTION:   -Boost Breeze po TID, each supplement provides 250 kcal and 9 grams of protein  NUTRITION DIAGNOSIS:   Malnutrition related to nausea, vomiting, altered GI function as evidenced by severe depletion of muscle mass, moderate depletion of body fat.  Ongoing  GOAL:   Patient will meet greater than or equal to 90% of their needs  Progressing  MONITOR:   Diet advancement, Skin, I & O's, Labs, Weight trends  REASON FOR ASSESSMENT:   Consult New TPN/TNA  ASSESSMENT:   82 y.o. female presenting with abdominal distention, nausea/vomiting, constipation progressively worsening for 2 months. PMH is significant for small intestinal bacterial overgrowth, chronic diarrhea, HTN (stable off meds), LV Diastolic Dysfunction, IBS, hx of TIA.   Spoke with pt at bedside. She is tolerating clear liquids well, but expressed frustration over limitations of clear liquid diet. Pt will only consume chicken broth, jello, italian ice pops, and some juices. She is eager for diet advancement and is requesting broccoli and cheese soup.   Pt reports she likes the Boost Breeze supplements, but prefers peach flavor. However, noted two unopened containers on bedside table.   TPN was held on 07/18/16 and d/c on 07/19/16 due to improvement in status. Per discussion with RN, pt is scheduled for duodenal stent on 07/21/16.   Spoke with nurse tech, who reveals that pt is experiencing hypoglycemia, however is very selective in interventions. Discussed with pt importance of good PO intake to prevent hypoglycemia.   Labs reviewed.   Diet Order:  Diet NPO time specified Diet clear liquid Room service appropriate? Yes; Fluid consistency: Thin  Skin:  Reviewed, no issues  Last BM:  07/19/16  Height:   Ht Readings from Last 1 Encounters:  07/14/16 5\' 2"  (1.575 m)    Weight:   Wt  Readings from Last 1 Encounters:  07/14/16 120 lb 2.4 oz (54.5 kg)    Ideal Body Weight:  50 kg  BMI:  Body mass index is 21.98 kg/m.  Estimated Nutritional Needs:   Kcal:  1300-1500  Protein:  70-80 grams  Fluid:  1.6 L/day  EDUCATION NEEDS:   No education needs identified at this time  Conard Alvira A. Jimmye Norman, RD, LDN, CDE Pager: [phone] After hours Pager: [phone]

## 2016-07-20 NOTE — Progress Notes (Signed)
Offered bath and pt. Stated someone had gave her bath that night and she was not going to need one today even tho I did not see it charted

## 2016-07-21 ENCOUNTER — Inpatient Hospital Stay (HOSPITAL_COMMUNITY): Payer: Medicare Other | Admitting: Certified Registered Nurse Anesthetist

## 2016-07-21 ENCOUNTER — Encounter (HOSPITAL_COMMUNITY)
Admission: EM | Disposition: A | Discharge status: Home / Self Care | Payer: Self-pay | Source: Home / Self Care | Attending: Family Medicine

## 2016-07-21 ENCOUNTER — Encounter (HOSPITAL_COMMUNITY): Payer: Self-pay | Admitting: *Deleted

## 2016-07-21 DIAGNOSIS — K2970 Gastritis, unspecified, without bleeding: Secondary | ICD-10-CM

## 2016-07-21 HISTORY — PX: EUS: SHX5427

## 2016-07-21 HISTORY — PX: ESOPHAGOGASTRODUODENOSCOPY: SHX5428

## 2016-07-21 LAB — CBC
HCT: 30.1 % — ABNORMAL LOW (ref 36.0–46.0)
Hemoglobin: 9.6 g/dL — ABNORMAL LOW (ref 12.0–15.0)
MCH: 24.2 pg — ABNORMAL LOW (ref 26.0–34.0)
MCHC: 31.9 g/dL (ref 30.0–36.0)
MCV: 75.8 fL — ABNORMAL LOW (ref 78.0–100.0)
Platelets: 125 10*3/uL — ABNORMAL LOW (ref 150–400)
RBC: 3.97 MIL/uL (ref 3.87–5.11)
RDW: 14.6 % (ref 11.5–15.5)
WBC: 5.3 10*3/uL (ref 4.0–10.5)

## 2016-07-21 LAB — COMPREHENSIVE METABOLIC PANEL
ALT: 13 U/L — ABNORMAL LOW (ref 14–54)
AST: 17 U/L (ref 15–41)
Albumin: 2.4 g/dL — ABNORMAL LOW (ref 3.5–5.0)
Alkaline Phosphatase: 34 U/L — ABNORMAL LOW (ref 38–126)
Anion gap: 3 — ABNORMAL LOW (ref 5–15)
BUN: 10 mg/dL (ref 6–20)
CO2: 24 mmol/L (ref 22–32)
Calcium: 8 mg/dL — ABNORMAL LOW (ref 8.9–10.3)
Chloride: 111 mmol/L (ref 101–111)
Creatinine, Ser: 0.83 mg/dL (ref 0.44–1.00)
GFR calc Af Amer: >60 mL/min (ref >60)
GFR calc non Af Amer: >60 mL/min (ref >60)
Glucose, Bld: 75 mg/dL (ref 65–99)
Potassium: 3.5 mmol/L (ref 3.5–5.1)
Sodium: 138 mmol/L (ref 135–145)
Total Bilirubin: 1 mg/dL (ref 0.3–1.2)
Total Protein: 4.2 g/dL — ABNORMAL LOW (ref 6.5–8.1)

## 2016-07-21 LAB — HEMOGLOBIN AND HEMATOCRIT, BLOOD
HCT: 28.4 % — ABNORMAL LOW (ref 36.0–46.0)
Hemoglobin: 9.2 g/dL — ABNORMAL LOW (ref 12.0–15.0)

## 2016-07-21 LAB — TYPE AND SCREEN
ABO/RH(D): A POS
Antibody Screen: NEGATIVE
Unit division: 0

## 2016-07-21 LAB — MAGNESIUM: Magnesium: 1.8 mg/dL (ref 1.7–2.4)

## 2016-07-21 LAB — GLUCOSE, CAPILLARY
Glucose-Capillary: 77 mg/dL (ref 65–99)
Glucose-Capillary: 73 mg/dL (ref 65–99)

## 2016-07-21 SURGERY — EGD (ESOPHAGOGASTRODUODENOSCOPY)
Anesthesia: Monitor Anesthesia Care

## 2016-07-21 MED ORDER — SODIUM CHLORIDE 0.9 % IV SOLN
1.5000 g | INTRAVENOUS | Status: DC
  Filled 2016-07-21: qty 1.5

## 2016-07-21 MED ORDER — ESMOLOL HCL 100 MG/10ML IV SOLN
INTRAVENOUS | Status: DC | PRN
  Administered 2016-07-21: 20 mg via INTRAVENOUS

## 2016-07-21 MED ORDER — POTASSIUM CHLORIDE CRYS ER 20 MEQ PO TBCR: 40.0000 meq | EXTENDED_RELEASE_TABLET | Freq: Once | ORAL | Status: DC

## 2016-07-21 MED ORDER — PHENYLEPHRINE HCL 10 MG/ML IJ SOLN
INTRAMUSCULAR | Status: DC | PRN
  Administered 2016-07-21: 40 ug via INTRAVENOUS

## 2016-07-21 MED ORDER — FENTANYL CITRATE (PF) 100 MCG/2ML IJ SOLN: 25.0000 ug | INTRAMUSCULAR | Status: DC | PRN

## 2016-07-21 MED ORDER — POTASSIUM CHLORIDE 20 MEQ/15ML (10%) PO SOLN
40.0000 meq | Freq: Once | ORAL | Status: AC
  Administered 2016-07-21: 40 meq via ORAL
  Filled 2016-07-21: qty 30

## 2016-07-21 MED ORDER — LACTATED RINGERS IV SOLN
INTRAVENOUS | Status: DC
  Administered 2016-07-21: 10:00:00 via INTRAVENOUS

## 2016-07-21 MED ORDER — MAGNESIUM SULFATE IN D5W 1-5 GM/100ML-% IV SOLN
1.0000 g | Freq: Once | INTRAVENOUS | Status: AC
  Administered 2016-07-21: 1 g via INTRAVENOUS
  Filled 2016-07-21: qty 100

## 2016-07-21 MED ORDER — PROPOFOL 500 MG/50ML IV EMUL
INTRAVENOUS | Status: DC | PRN
  Administered 2016-07-21: 75 ug/kg/min via INTRAVENOUS

## 2016-07-21 MED ORDER — MAGNESIUM SULFATE IN D5W 1-5 GM/100ML-% IV SOLN
1.0000 g | Freq: Once | INTRAVENOUS | Status: DC
  Filled 2016-07-21: qty 100

## 2016-07-21 MED ORDER — SODIUM CHLORIDE 0.9 % IV SOLN: INTRAVENOUS | Status: DC

## 2016-07-21 NOTE — Interval H&P Note (Signed)
History and Physical Interval Note:  07/21/2016 10:58 AM  Linda Macias Shella Spearing  has presented today for surgery, with the diagnosis of duodenal mass  The various methods of treatment have been discussed with the patient and family. After consideration of risks, benefits and other options for treatment, the patient has consented to  Procedure(s): ESOPHAGOGASTRODUODENOSCOPY (EGD) (N/A) DUODENAL STENT PLACEMENT (N/A) ESOPHAGEAL ENDOSCOPIC ULTRASOUND (EUS) RADIAL (N/A) as a surgical intervention .  The patient's history has been reviewed, patient examined, no change in status, stable for surgery.  I have reviewed the patient's chart and labs.  Questions were answered to the patient's satisfaction.     Milus Banister  Of note, I have spoken with Dr. Watt Climes about this case and he understands I am not planning on placing a stent. The purpose of this exam is to better understand the nature of the duodenal lesion (repeat mucosal biopsies +/- EUS FNA)

## 2016-07-21 NOTE — Evaluation (Signed)
Physical Therapy Evaluation Patient Details Name: Linda Macias MRN: [id number] DOB: Jul 08, 1923 Today's Date: 07/21/2016   History of Present Illness  Pt is a 82 y.o. female admitted with abdominal pain. Pt undergoing esophageal endoscopic ultrasound on 07/21/16. PMH: HTN, CVA, cancer, Rt THA.   Clinical Impression  Pt seen for initial PT evaluation and treatment including ambulation of 60 feet with rollator walker and min guard assistance. The patient's daughter was present during the session and very supportive. Anticipate that the pt will D/C to home with family assistance once released from the hospital. PT to continue to follow to progress activity tolerance and safety.     Follow Up Recommendations Home health PT;Supervision for mobility/OOB    Equipment Recommendations  None recommended by PT    Recommendations for Other Services       Precautions / Restrictions Precautions Precautions: Fall Restrictions Weight Bearing Restrictions: No      Mobility  Bed Mobility Overal bed mobility: Needs Assistance Bed Mobility: Supine to Sit;Sit to Supine     Supine to sit: Min guard Sit to supine: Mod assist   General bed mobility comments: assist provided with LEs when getting into bed.   Transfers Overall transfer level: Needs assistance Equipment used: None Transfers: Sit to/from Stand Sit to Stand: Min guard         General transfer comment: Good use of hands, cues for posture.   Ambulation/Gait Ambulation/Gait assistance: Min guard Ambulation Distance (Feet): 60 Feet Assistive device: 4-wheeled walker Gait Pattern/deviations: Step-through pattern;Trunk flexed;Narrow base of support Gait velocity: decreased   General Gait Details: cues occasionally provided to avoid objects in hall. No loss of balance but mild instability noted.   Stairs            Wheelchair Mobility    Modified Rankin (Stroke Patients Only)       Balance Overall balance  assessment: Needs assistance Sitting-balance support: No upper extremity supported Sitting balance-Leahy Scale: Good     Standing balance support: During functional activity Standing balance-Leahy Scale: Fair Standing balance comment: using rollator for ambluation                             Pertinent Vitals/Pain Pain Assessment: Faces Faces Pain Scale: No hurt Pain Intervention(s): Monitored during session    Home Living Family/patient expects to be discharged to:: Private residence Living Arrangements: Alone Available Help at Discharge: Family;Available 24 hours/day Type of Home: House Home Access: Level entry     Home Layout: One level Home Equipment: Walker - 4 wheels Additional Comments: Pt/family confirm that she will have family assistance at home upon D/C.     Prior Function Level of Independence: Independent with assistive device(s)         Comments: using rollator     Hand Dominance        Extremity/Trunk Assessment   Upper Extremity Assessment: Generalized weakness           Lower Extremity Assessment: Generalized weakness         Communication   Communication: No difficulties  Cognition Arousal/Alertness: Awake/alert Behavior During Therapy: WFL for tasks assessed/performed Overall Cognitive Status: Within Functional Limits for tasks assessed                      General Comments General comments (skin integrity, edema, etc.): daughter present and supportive throughout session    Exercises     Assessment/Plan  PT Assessment Patient needs continued PT services  PT Problem List Decreased strength;Decreased range of motion;Decreased activity tolerance;Decreased balance;Decreased mobility          PT Treatment Interventions DME instruction;Gait training;Functional mobility training;Therapeutic activities;Therapeutic exercise;Stair training;Patient/family education    PT Goals (Current goals can be found in the  Care Plan section)  Acute Rehab PT Goals Patient Stated Goal: go home from the hospital when she has to leave PT Goal Formulation: With patient/family Time For Goal Achievement: 08/04/16 Potential to Achieve Goals: Good    Frequency Min 3X/week   Barriers to discharge        Co-evaluation               End of Session Equipment Utilized During Treatment: Gait belt Activity Tolerance: Patient tolerated treatment well Patient left: in bed;with call bell/phone within reach;with family/visitor present;with nursing/sitter in room Nurse Communication: Mobility status         Time: [id number] PT Time Calculation (min) (ACUTE ONLY): 34 min   Charges:   PT Evaluation $PT Eval Moderate Complexity: 1 Procedure PT Treatments $Gait Training: 8-22 mins   PT G Codes:        Cassell Clement, PT, CSCS Pager [phone] Office [phone]  07/21/2016, 3:19 PM

## 2016-07-21 NOTE — Anesthesia Preprocedure Evaluation (Addendum)
Anesthesia Evaluation  Patient identified by MRN, date of birth, ID band Patient awake    Reviewed: Allergy & Precautions, NPO status , Patient's Chart, lab work & pertinent test results  History of Anesthesia Complications Negative for: history of anesthetic complications  Airway Mallampati: I  TM Distance: >3 FB Neck ROM: Full    Dental  (+) Missing, Dental Advisory Given   Pulmonary neg pulmonary ROS,    breath sounds clear to auscultation       Cardiovascular hypertension (off meds now), (-) angina Rhythm:Regular Rate:Normal  '16 ECHO: EF 55-60%, mild MR   Neuro/Psych TIA   GI/Hepatic Neg liver ROS, For duodenal stent today   Endo/Other  negative endocrine ROS  Renal/GU negative Renal ROS     Musculoskeletal  (+) Arthritis ,   Abdominal   Peds  Hematology  (+) Blood dyscrasia (Hb 9.6), anemia ,   Anesthesia Other Findings   Reproductive/Obstetrics                            Anesthesia Physical Anesthesia Plan  ASA: III  Anesthesia Plan: MAC   Post-op Pain Management:    Induction: Intravenous  Airway Management Planned: Natural Airway and Nasal Cannula  Additional Equipment:   Intra-op Plan:   Post-operative Plan:   Informed Consent: I have reviewed the patients History and Physical, chart, labs and discussed the procedure including the risks, benefits and alternatives for the proposed anesthesia with the patient or authorized representative who has indicated his/her understanding and acceptance.   Dental advisory given  Plan Discussed with: CRNA and Surgeon  Anesthesia Plan Comments: (Plan routine monitors, MAC)        Anesthesia Quick Evaluation

## 2016-07-21 NOTE — Progress Notes (Signed)
Family Medicine Teaching Service Daily Progress Note Intern Pager: [phone]  Patient name: Linda Macias Medical record number: [id number] Date of birth: December 03, 1922 Age: 82 y.o. Gender: female  Primary Care Provider: Lockie Pares, MD Consultants: GI Code Status: DNR  Pt Overview and Major Events to Date:  9/13 admitted with 90m of nausea and NBNB emesis 9/14 GI consulted 9/15 endoscopy with duodenal mass biopsy sent for analysis 9/18 path without malignancy > duodenitis 9/20 EGD scheduled  Assessment and Plan: Linda Macias is a 82 y.o. female presenting with abdominal distention, nausea/vomiting, constipation progressively worsening for 2 months. PMH is significant for small intestinal bacterial overgrowth, chronic diarrhea, HTN (stable off meds), LV Diastolic Dysfunction, IBS, hx of TIA.   Gastric Outlet Obstruction: Duodenal mass biopsy found duodenitis. NPO for EGD with [place] and possible stent 9/20. Patient has declined surgery at this point but is open to having a stent placed. - Hold off TPN, maintain PICC as she is likely to still need supplementation - Replacing lytes intravenously, potassium up to 4 today, phos 3.1, mag 1.7 - Continue following CMP - GI recommendations appreciated  HTN: Not on medications at home. Currently stable - will monitor  Hypokalemia, hypomagnesemia: K stable between 3.8-4. Mag most recently 1.7, repleted. Rechecked Mg > 1.8.  - will monitor and replace  - replace K, Mg 9/20  Microcytic Anemia: hgb 9 on admit. Baseline ~8. No signs of bleeding. Last Colonoscopy in 07/2014 with two polyps with pathology of tubular adenoma and no high grade dysplasia. Tranfused 1 unit PRBC 9/18. - monitor CBC  Left Ventricular Diastolic Dysfunction: ECHO 08/2015 with EF 55-60%, pseudonormal LV filling pattern with G2DD. Trace pitting edema up to knees. Lungs are clear to auscultation. No dyspnea or orthopnea.  - will monitor  History of Small intestinal  bacterial overgrowth: was diagnosed with this about 1 year ago and had associated weight loss. Was on Xifaxan but currently off this. Her chronic diarrhea and weight improved despite being off of Xifaxan. Was seen by GI a few weeks ago, and granddaughter reports patient was doing well per GI at that time. Currently having soft stools, no diarrhea.   Placement: Patient lives at home alone, would rather not go to a SNF, but amenable to PT assessment.  -PT eval and treat  FEN/GI: NPO this am for EGD  Prophylaxis: Lovenox  Disposition: continued inpatient management   Subjective:  Pt pleasant and looking forward to procedure today. Says she loves it here and would like to stay for a while. She is very pleased with her care teams.  Objective: Temp:  [97.4 F (36.3 C)-98.5 F (36.9 C)] (P) 98.1 F (36.7 C) (09/20 0555) Pulse Rate:  [64-75] (P) 71 (09/20 0555) Resp:  [16-19] (P) 18 (09/20 0555) BP: (136-155)/(67-94) (P) 138/65 (09/20 0555) SpO2:  [100 %] (P) 100 % (09/20 0555) Physical Exam: GEN: NAD, thin elderly female in no acute distress CV: Regular rhythm, no murmurs PULM: CTAB, normal effort ABD: Soft, mild tenderness to palpation in upper quadrants b/l, decreased BS present EXTR: trace edema to knees bilaterally   Laboratory:  Recent Labs Lab 07/17/16 0504 07/19/16 0500 07/20/16 0505 07/21/16 0012  WBC 5.1 4.8 3.9*  --   HGB 8.8* 9.2* 7.9* 9.2*  HCT 27.8* 28.6* 24.5* 28.4*  PLT 191 138* 131*  --     Recent Labs Lab 07/18/16 0530 07/19/16 0500 07/20/16 0505  NA 139 138 138  K 3.3* 4.0 3.8  CL 108  110 111  CO2 26 22 24   BUN 10 12 13   CREATININE 0.88 0.76 0.89  CALCIUM 8.2* 7.9* 7.8*  PROT 4.4* 4.5* 4.0*  BILITOT 0.2* 0.2* 0.7  ALKPHOS 31* 33* 29*  ALT 9* 9* 10*  AST 15 15 13*  GLUCOSE 107* 116* 79    Ralene Ok, MD [place], PGY-1 [place] Intern pager: [phone], text pages welcome 07/21/2016, 6:30 AM

## 2016-07-21 NOTE — Anesthesia Postprocedure Evaluation (Signed)
Anesthesia Post Note  Patient: Linda Macias  Procedure(s) Performed: Procedure(s) (LRB): ESOPHAGOGASTRODUODENOSCOPY (EGD) (N/A) ESOPHAGEAL ENDOSCOPIC ULTRASOUND (EUS) RADIAL (N/A)  Patient location during evaluation: Endoscopy Anesthesia Type: MAC Level of consciousness: awake and alert Pain management: pain level controlled Vital Signs Assessment: post-procedure vital signs reviewed and stable Respiratory status: spontaneous breathing, nonlabored ventilation and respiratory function stable Cardiovascular status: blood pressure returned to baseline and stable Postop Assessment: no signs of nausea or vomiting Anesthetic complications: no    Last Vitals:  Vitals:   07/21/16 1305 07/21/16 1315  BP: (!) 154/72 (!) 178/89  Pulse: 65 62  Resp: 14 11  Temp:      Last Pain:  Vitals:   07/21/16 1246  TempSrc:   PainSc: 0-No pain                 Murlin Schrieber,E. [place]

## 2016-07-21 NOTE — Progress Notes (Signed)
PT Cancellation Note  Patient Details Name: CARRIEANN GILVIN MRN: [id number] DOB: August 27, 1923   Cancelled Treatment:    Reason Eval/Treat Not Completed: Patient at procedure or test/unavailable, will continue to follow as able.    Cassell Clement, PT, CSCS Pager [phone] Office [phone]  07/21/2016, 10:25 AM

## 2016-07-21 NOTE — H&P (View-Only) (Signed)
[facility] Progress Note  Subjective: Feels okay, vomited some last night  Objective: Vital signs in last 24 hours: Temp:  [98 F (36.7 C)-98.7 F (37.1 C)] 98.2 F (36.8 C) (09/18 0520) Pulse Rate:  [64-125] 115 (09/18 0520) Resp:  [16-18] 16 (09/18 0520) BP: (85-152)/(61-78) 85/61 (09/18 0520) SpO2:  [100 %] 100 % (09/18 0520) Weight change:    PE: Unchanged  Lab Results: Results for orders placed or performed during the hospital encounter of 07/14/16 (from the past 24 hour(s))  Glucose, capillary     Status: Abnormal   Collection Time: 07/18/16 12:56 PM  Result Value Ref Range   Glucose-Capillary 113 (H) 65 - 99 mg/dL  Glucose, capillary     Status: Abnormal   Collection Time: 07/18/16  6:02 PM  Result Value Ref Range   Glucose-Capillary 109 (H) 65 - 99 mg/dL  Comprehensive metabolic panel     Status: Abnormal   Collection Time: 07/19/16  5:00 AM  Result Value Ref Range   Sodium 138 135 - 145 mmol/L   Potassium 4.0 3.5 - 5.1 mmol/L   Chloride 110 101 - 111 mmol/L   CO2 22 22 - 32 mmol/L   Glucose, Bld 116 (H) 65 - 99 mg/dL   BUN 12 6 - 20 mg/dL   Creatinine, Ser 0.76 0.44 - 1.00 mg/dL   Calcium 7.9 (L) 8.9 - 10.3 mg/dL   Total Protein 4.5 (L) 6.5 - 8.1 g/dL   Albumin 2.5 (L) 3.5 - 5.0 g/dL   AST 15 15 - 41 U/L   ALT 9 (L) 14 - 54 U/L   Alkaline Phosphatase 33 (L) 38 - 126 U/L   Total Bilirubin 0.2 (L) 0.3 - 1.2 mg/dL   GFR calc non Af Amer >60 >60 mL/min   GFR calc Af Amer >60 >60 mL/min   Anion gap 6 5 - 15  Magnesium     Status: None   Collection Time: 07/19/16  5:00 AM  Result Value Ref Range   Magnesium 1.7 1.7 - 2.4 mg/dL  Phosphorus     Status: None   Collection Time: 07/19/16  5:00 AM  Result Value Ref Range   Phosphorus 3.1 2.5 - 4.6 mg/dL  CBC     Status: Abnormal   Collection Time: 07/19/16  5:00 AM  Result Value Ref Range   WBC 4.8 4.0 - 10.5 K/uL   RBC 3.85 (L) 3.87 - 5.11 MIL/uL   Hemoglobin 9.2 (L) 12.0 - 15.0 g/dL   HCT  28.6 (L) 36.0 - 46.0 %   MCV 74.3 (L) 78.0 - 100.0 fL   MCH 23.9 (L) 26.0 - 34.0 pg   MCHC 32.2 30.0 - 36.0 g/dL   RDW 14.5 11.5 - 15.5 %   Platelets 138 (L) 150 - 400 K/uL  Differential     Status: None   Collection Time: 07/19/16  5:00 AM  Result Value Ref Range   Neutrophils Relative % 70 %   Lymphocytes Relative 22 %   Monocytes Relative 7 %   Eosinophils Relative 1 %   Basophils Relative 0 %   Neutro Abs 3.4 1.7 - 7.7 K/uL   Lymphs Abs 1.1 0.7 - 4.0 K/uL   Monocytes Absolute 0.3 0.1 - 1.0 K/uL   Eosinophils Absolute 0.0 0.0 - 0.7 K/uL   Basophils Absolute 0.0 0.0 - 0.1 K/uL   RBC Morphology SCHISTOCYTES NOTED ON SMEAR   Triglycerides     Status: None   Collection Time: 07/19/16  5:00 AM  Result Value Ref Range   Triglycerides 114 <150 mg/dL  Prealbumin     Status: Abnormal   Collection Time: 07/19/16  5:00 AM  Result Value Ref Range   Prealbumin 9.2 (L) 18 - 38 mg/dL  Glucose, capillary     Status: Abnormal   Collection Time: 07/19/16  5:18 AM  Result Value Ref Range   Glucose-Capillary 107 (H) 65 - 99 mg/dL  Glucose, capillary     Status: None   Collection Time: 07/19/16 10:08 AM  Result Value Ref Range   Glucose-Capillary 94 65 - 99 mg/dL   Comment 1 Notify RN     Studies/Results: No results found.    Assessment: Duodenal obstruction, nature unclear biopsies pending  Plan: Scheduled for EGD with duodenal stent placement tomorrow. She wished to proceed.    Phillp Dolores C 07/19/2016, 10:42 AM  Pager [phone] If no answer or after 5 PM call [phone]

## 2016-07-21 NOTE — Op Note (Signed)
[facility] Patient Name: Linda Macias Procedure Date : 07/21/2016 MRN: [id number] Attending MD: Milus Banister , MD Date of Birth: 01-01-1923 CSN: [id number] Age: 82 Admit Type: Outpatient Procedure:                Upper EUS Indications:              Abnormal endoscopy Dr. Watt Climes last week; duodenal                            bulb lesion causing GOO; biopsies did not show                            neoplasia Providers:                Milus Banister, MD, Cleda Daub, RN, Despina Pole Tech, Technician, Tawni Carnes, CRNA Referring MD:             Clarene Essex, MD Medicines:                Monitored Anesthesia Care Complications:            No immediate complications. Estimated blood loss:                            None. Estimated Blood Loss:     Estimated blood loss: none. Procedure:                Pre-Anesthesia Assessment:                           - Prior to the procedure, a History and Physical                            was performed, and patient medications and                            allergies were reviewed. The patient's tolerance of                            previous anesthesia was also reviewed. The risks                            and benefits of the procedure and the sedation                            options and risks were discussed with the patient.                            All questions were answered, and informed consent                            was obtained. Prior Anticoagulants: The patient has  taken no previous anticoagulant or antiplatelet                            agents. ASA Grade Assessment: III - A patient with                            severe systemic disease. After reviewing the risks                            and benefits, the patient was deemed in                            satisfactory condition to undergo the procedure.                           After obtaining informed  consent, the endoscope was                            passed under direct vision. Throughout the                            procedure, the patient's blood pressure, pulse, and                            oxygen saturations were monitored continuously. The                            [id number]) scope was introduced through                            the mouth, and advanced to the duodenal bulb. The                            [id number]) scope was introduced through                            the mouth, and advanced to the duodenal bulb. The                            Endoscope was introduced through the mouth, and                            advanced to the duodenal bulb. The upper EUS was                            accomplished without difficulty. The patient                            tolerated the procedure well. Scope In: Scope Out: Findings:      Endoscopic Finding :      1. There was moderate to severe, beefy red gastritis along the lesser       curvature of the stomach (from GE junction to antrum). The other side of  the stomach appeared normal. Biopsies were taken from the gastritis,       sent to pathology.      2. Distal duodenal bulb mucosa was very abnormal; edematous appearing,       very spastic as well with significant peristaltic waves nearly       continuously at the site. I could not advance adult gastroscope through       stenotic lumen at this site. Mucosal biopsies were taken and sent to       patholgy.      Endosonographic Finding :      1. Gastric wall, along the are of inflammation was non-specifically       thickened without obvious mas. The thickening appeared confined to the       mucosa, deep mucosal layers.      2. The abnormal mucosa in the distal duodenal bulb was also       non-specifically thickened (mucosa and deep mucosal layers) without       overt mass lesion.      3. Small amount of perigastric ascites present.      4. Main  pancreatic duct was dilated (25mm in body, tail--could not       visualize the head of pancreas). Impression:               - Abnormal gastritis, longitudinally from GE                            junction to antrum along the lesser curvature. This                            was biopsied. EUS evaluation shows mucosal, deep                            mucosal thickening but not overt mass.                           - Abnormal mucosa in distal duodenum, edematous and                            softly stenotic but not clearly neoplastic. This                            was biopsied, felt to be quite soft. EUS evaluation                            shows mucosal, deep mucosal thickening but no overt                            mass. Recommendation:           - Return patient to hospital ward for ongoing care.                           - Observe clinically and repeat endoscopic                            evaluation in 4-6 weeks if she is able to advance  diet to meet her nutritional needs VS. surgical                            biopsy (laparoscopy).                           - Await gastric and duodenal biopsies for now. Procedure Code(s):        --- Professional ---                           [phone], Esophagogastroduodenoscopy, flexible,                            transoral; with endoscopic ultrasound examination                            limited to the esophagus, stomach or duodenum, and                            adjacent structures Diagnosis Code(s):        --- Professional ---                           K31.89, Other diseases of stomach and duodenum                           K92.9, Disease of digestive system, unspecified CPT copyright 2016 American Medical Association. All rights reserved. The codes documented in this report are preliminary and upon coder review may  be revised to meet current compliance requirements. Milus Banister, MD 07/21/2016 1:00:13 PM This  report has been signed electronically. Number of Addenda: 0

## 2016-07-21 NOTE — Transfer of Care (Signed)
Immediate Anesthesia Transfer of Care Note  Patient: Linda Macias  Procedure(s) Performed: Procedure(s): ESOPHAGOGASTRODUODENOSCOPY (EGD) (N/A) ESOPHAGEAL ENDOSCOPIC ULTRASOUND (EUS) RADIAL (N/A)  Patient Location: PACU and Endoscopy Unit  Anesthesia Type:MAC  Level of Consciousness: awake, alert , oriented and patient cooperative  Airway & Oxygen Therapy: Patient Spontanous Breathing and Patient connected to nasal cannula oxygen  Post-op Assessment: Report given to RN and Post -op Vital signs reviewed and stable  Post vital signs: Reviewed and stable  Last Vitals:  Vitals:   07/21/16 1021 07/21/16 1246  BP: (!) 157/76 126/68  Pulse: 71   Resp: 16   Temp:      Last Pain:  Vitals:   07/21/16 1246  TempSrc:   PainSc: 0-No pain      Patients Stated Pain Goal: 2 ([id number])  Complications: No apparent anesthesia complications

## 2016-07-22 LAB — COMPREHENSIVE METABOLIC PANEL
ALT: 14 U/L (ref 14–54)
AST: 19 U/L (ref 15–41)
Albumin: 2.4 g/dL — ABNORMAL LOW (ref 3.5–5.0)
Alkaline Phosphatase: 37 U/L — ABNORMAL LOW (ref 38–126)
Anion gap: 6 (ref 5–15)
BUN: 8 mg/dL (ref 6–20)
CO2: 24 mmol/L (ref 22–32)
Calcium: 8.2 mg/dL — ABNORMAL LOW (ref 8.9–10.3)
Chloride: 110 mmol/L (ref 101–111)
Creatinine, Ser: 0.87 mg/dL (ref 0.44–1.00)
GFR calc Af Amer: >60 mL/min (ref >60)
GFR calc non Af Amer: 57 mL/min — ABNORMAL LOW (ref >60)
Glucose, Bld: 68 mg/dL (ref 65–99)
Potassium: 4 mmol/L (ref 3.5–5.1)
Sodium: 140 mmol/L (ref 135–145)
Total Bilirubin: 0.8 mg/dL (ref 0.3–1.2)
Total Protein: 4.4 g/dL — ABNORMAL LOW (ref 6.5–8.1)

## 2016-07-22 LAB — GLUCOSE, CAPILLARY
Glucose-Capillary: 74 mg/dL (ref 65–99)
Glucose-Capillary: 81 mg/dL (ref 65–99)
Glucose-Capillary: 113 mg/dL — ABNORMAL HIGH (ref 65–99)

## 2016-07-22 LAB — CBC
HCT: 30.1 % — ABNORMAL LOW (ref 36.0–46.0)
Hemoglobin: 9.6 g/dL — ABNORMAL LOW (ref 12.0–15.0)
MCH: 24.4 pg — ABNORMAL LOW (ref 26.0–34.0)
MCHC: 31.9 g/dL (ref 30.0–36.0)
MCV: 76.6 fL — ABNORMAL LOW (ref 78.0–100.0)
Platelets: 141 10*3/uL — ABNORMAL LOW (ref 150–400)
RBC: 3.93 MIL/uL (ref 3.87–5.11)
RDW: 14.8 % (ref 11.5–15.5)
WBC: 4.9 10*3/uL (ref 4.0–10.5)

## 2016-07-22 MED ORDER — FAMOTIDINE 20 MG PO TABS
20.0000 mg | ORAL_TABLET | Freq: Every day | ORAL | Status: DC
  Administered 2016-07-22 – 2016-07-25 (×4): 20 mg via ORAL
  Filled 2016-07-22 (×4): qty 1

## 2016-07-22 MED ORDER — SODIUM CHLORIDE 0.9 % IV SOLN
510.0000 mg | Freq: Once | INTRAVENOUS | Status: AC
  Administered 2016-07-22: 510 mg via INTRAVENOUS
  Filled 2016-07-22: qty 17

## 2016-07-22 NOTE — Care Management Important Message (Signed)
Important Message  Patient Details  Name: Linda Macias MRN: [id number] Date of Birth: 06/22/1923   Medicare Important Message Given:  Yes    Jenisha Faison 07/22/2016, 11:25 AM

## 2016-07-22 NOTE — Care Management Note (Signed)
Case Management Note  Patient Details  Name: Linda Macias MRN: [id number] Date of Birth: 21-Dec-1922  Subjective/Objective:                    Action/Plan:   Expected Discharge Date:                  Expected Discharge Plan:  [place]  In-House Referral:     Discharge planning Services  CM Consult  Post Acute Care Choice:  Home Health Choice offered to:  Patient, Adult Children  DME Arranged:    DME Agency:     HH Arranged:  PT [place]:  [place]  Status of Service:  Completed, signed off  If discussed at [place] of Stay Meetings, dates discussed:    Additional Comments:  Marilu Favre, RN 07/22/2016, 12:20 PM

## 2016-07-22 NOTE — Progress Notes (Signed)
Nutrition Follow-up  DOCUMENTATION CODES:   Non-severe (moderate) malnutrition in context of chronic illness  INTERVENTION:   -Continue Boost Breeze po TID, each supplement provides 250 kcal and 9 grams of protein  NUTRITION DIAGNOSIS:   Malnutrition related to nausea, vomiting, altered GI function as evidenced by severe depletion of muscle mass, moderate depletion of body fat.  Ongoing  GOAL:   Patient will meet greater than or equal to 90% of their needs  Progressing  MONITOR:   PO intake, Supplement acceptance, Labs, Weight trends, Skin, I & O's  REASON FOR ASSESSMENT:   Consult New TPN/TNA  ASSESSMENT:   82 y.o. female presenting with abdominal distention, nausea/vomiting, constipation progressively worsening for 2 months. PMH is significant for small intestinal bacterial overgrowth, chronic diarrhea, HTN (stable off meds), LV Diastolic Dysfunction, IBS, hx of TIA.   Pt underwent upper abdominal EUS on 07/21/16. Study revealed abnormal mucosa and distal duodenum and was biopsied. Repeat pathology is pending.   Pt sitting up in bed eating lunch at time of visit. She reports "This is good", referring to her food (soft diet). Pt consumed about 25% of mashed potatoes and [place] at time of visit. She is happy to eat solid foods.   Discussed supplement options with pt. She reveals she would like to continue Boost Breeze supplements. She declined offer of Ensure.   Pt reveals that she is anticipating going home tomorrow.   Case discussed with RN. She confirms pt was advanced to a soft diet. Pt has not received Boost Breeze this shift, due to it being unavailable on the unit presently.   Labs reviewed.   Diet Order:  DIET SOFT Room service appropriate? Yes; Fluid consistency: Thin  Skin:  Reviewed, no issues  Last BM:  07/22/16  Height:   Ht Readings from Last 1 Encounters:  07/14/16 5\' 2"  (1.575 m)    Weight:   Wt Readings from Last 1 Encounters:  07/14/16 120  lb 2.4 oz (54.5 kg)    Ideal Body Weight:  50 kg  BMI:  Body mass index is 21.98 kg/m.  Estimated Nutritional Needs:   Kcal:  1300-1500  Protein:  70-80 grams  Fluid:  1.6 L/day  EDUCATION NEEDS:   No education needs identified at this time  Avyukth Bontempo A. Jimmye Norman, RD, LDN, CDE Pager: [phone] After hours Pager: [phone]

## 2016-07-22 NOTE — Progress Notes (Signed)
[facility] Gastroenterology Progress Note  Linda Macias 82 y.o. [birth year]  C/C : f/up for duodenal bulb mass   Subjective:  Patient tolerated liquid diet.  started on soft diet now. Some nausea but denied any vomiting. 2 bowel movement yesterday. Remains afebrile  Objective: Vital signs in last 24 hours: Vitals:   07/21/16 2213 07/22/16 0611  BP: (!) 150/81 (!) 165/94  Pulse: 70 66  Resp: 14 15  Temp: 98.5 F (36.9 C) 98.5 F (36.9 C)    Physical Exam:  General:  Alert, cooperative, Elderly not in acute distress   Head:  Normocephalic, without obvious abnormality, atraumatic  Eyes:  , EOM's intact,   Lungs:   Clear to auscultation bilaterally, respirations unlabored  Heart:  Regular rate and rhythm,   Abdomen:   Soft, non-tender, bowel sounds active all four quadrants,  no masses,   Extremities: Extremities normal, atraumatic, no  edema       Lab Results:  Recent Labs  07/21/16 0629 07/22/16 0450  NA 138 140  K 3.5 4.0  CL 111 110  CO2 24 24  GLUCOSE 75 68  BUN 10 8  CREATININE 0.83 0.87  CALCIUM 8.0* 8.2*  MG 1.8  --     Recent Labs  07/21/16 0629 07/22/16 0450  AST 17 19  ALT 13* 14  ALKPHOS 34* 37*  BILITOT 1.0 0.8  PROT 4.2* 4.4*  ALBUMIN 2.4* 2.4*    Recent Labs  07/21/16 0629 07/22/16 0450  WBC 5.3 4.9  HGB 9.6* 9.6*  HCT 30.1* 30.1*  MCV 75.8* 76.6*  PLT 125* 141*   No results for input(s): LABPROT, INR in the last 72 hours.    Assessment/Plan: - Duodenal bulb mass causing outlet obstruction. Status post EGD 09/15. Biopsy negative. Status post EUS yesterday - Anemia. Stable  - HTN    Recommendations -------------------------- - Follow biopsy results. - continue  diet as tolerated - Further plan based on biopsy finding. If ongoing obstructive symptoms, may consider surgical J or alternative routes for feeding. Not sure of utility of a duodenal stent in benign lesion - GI will follow.  Otis Brace MD. [name]  07/22/2016,  8:39 AM  Pager [phone]  If no answer or after 5 PM call [phone]

## 2016-07-22 NOTE — Progress Notes (Signed)
Family Medicine Teaching Service Daily Progress Note Intern Pager: [phone]  Patient name: Linda Macias Medical record number: [id number] Date of birth: January 01, 1923 Age: 82 y.o. Gender: female  Primary Care Provider: Lockie Pares, MD Consultants: GI Code Status: DNR  Pt Overview and Major Events to Date:  9/13 admitted with 9m of nausea and NBNB emesis 9/14 GI consulted 9/15 endoscopy with duodenal mass biopsy sent for analysis 9/18 path without malignancy > duodenitis 9/20 EGD with US performed, repeat biopsies taken  Assessment and Plan: Linda Macias is a 82 y.o. female presenting with abdominal distention, nausea/vomiting, constipation progressively worsening for 2 months. PMH is significant for small intestinal bacterial overgrowth, chronic diarrhea, HTN (stable off meds), LV Diastolic Dysfunction, IBS, hx of TIA.   Gastric Outlet Obstruction: Duodenal mass biopsy found duodenitis.  Patient has declined surgery at this point but is open to having a stent placed. - advanced diet to soft 9/20 - Continue following CMP - GI recommendations appreciated: attempt to eat and maintain nutrition, will consider additional  -soft diet ordered, pt has not tried to eat any solids yet -repeat path pending  HTN: Not on medications at home. Currently hypertensive 150-178/76-94. - will continue to monitor  Hypokalemia, hypomagnesemia: K stable between 3.8-4. Mag most recently 1.7, repleted. Rechecked Mg > 1.8. Mag and K repleted 9/20. - will monitor   Microcytic Anemia: hgb 9 on admit. Baseline ~8. No signs of bleeding. Last Colonoscopy in 07/2014 with two polyps with pathology of tubular adenoma and no high grade dysplasia. Tranfused 1 unit PRBC 9/18. Hgb stable at 9.6 post transfusion. - monitor CBC  Left Ventricular Diastolic Dysfunction: ECHO 08/2015 with EF 55-60%, pseudonormal LV filling pattern with G2DD. Trace pitting edema up to knees. Lungs are clear to auscultation. No  dyspnea or orthopnea.  - will monitor  History of Small intestinal bacterial overgrowth: was diagnosed with this about 1 year ago and had associated weight loss. Was on Xifaxan but currently off this. Her chronic diarrhea and weight improved despite being off of Xifaxan. Was seen by GI a few weeks ago, and granddaughter reports patient was doing well per GI at that time. Currently having soft stools, no diarrhea.   Placement: Patient lives at home alone, would rather not go to a SNF, but amenable to PT assessment.  -PT eval and treat: HHPT with supervision for mobility   FEN/GI: soft diet Prophylaxis: Lovenox  Disposition: continued inpatient management   Subjective:  Pt feels "shitty" today because she had an accident with incontinence. Otherwise pleasant and happy, asking for her perfume. She is excited to try soft foods today.  Objective: Temp:  [97.7 F (36.5 C)-98.5 F (36.9 C)] 98.5 F (36.9 C) (09/21 [id number]) Pulse Rate:  [62-77] 66 (09/21 0611) Resp:  [11-16] 15 (09/21 0611) BP: (126-178)/(68-94) 165/94 (09/21 0611) SpO2:  [98 %-100 %] 100 % (09/21 [id number]) Physical Exam: GEN: NAD, thin elderly female in no acute distress CV: Regular rhythm, no murmurs PULM: CTAB, normal effort ABD: Soft, mild tenderness to palpation in upper quadrants b/l, decreased BS present EXTR: trace edema to knees bilaterally   Laboratory:  Recent Labs Lab 07/20/16 0505 07/21/16 0012 07/21/16 0629 07/22/16 0450  WBC 3.9*  --  5.3 4.9  HGB 7.9* 9.2* 9.6* 9.6*  HCT 24.5* 28.4* 30.1* 30.1*  PLT 131*  --  125* 141*    Recent Labs Lab 07/20/16 0505 07/21/16 0629 07/22/16 0450  NA 138 138 140  K 3.8 3.5 4.0  CL 111 111 110  CO2 24 24 24   BUN 13 10 8   CREATININE 0.89 0.83 0.87  CALCIUM 7.8* 8.0* 8.2*  PROT 4.0* 4.2* 4.4*  BILITOT 0.7 1.0 0.8  ALKPHOS 29* 34* 37*  ALT 10* 13* 14  AST 13* 17 19  GLUCOSE 79 75 68    Ralene Ok, MD [place], PGY-1 [place] Intern  pager: [phone], text pages welcome 07/22/2016, 7:08 AM

## 2016-07-23 ENCOUNTER — Encounter: Payer: Self-pay | Admitting: Diagnostic Radiology

## 2016-07-23 ENCOUNTER — Inpatient Hospital Stay (HOSPITAL_COMMUNITY): Payer: Medicare Other

## 2016-07-23 DIAGNOSIS — R197 Diarrhea, unspecified: Secondary | ICD-10-CM

## 2016-07-23 LAB — COMPREHENSIVE METABOLIC PANEL
ALT: 14 U/L (ref 14–54)
AST: 16 U/L (ref 15–41)
Albumin: 2.4 g/dL — ABNORMAL LOW (ref 3.5–5.0)
Alkaline Phosphatase: 38 U/L (ref 38–126)
Anion gap: 5 (ref 5–15)
BUN: 6 mg/dL (ref 6–20)
CO2: 23 mmol/L (ref 22–32)
Calcium: 8.2 mg/dL — ABNORMAL LOW (ref 8.9–10.3)
Chloride: 109 mmol/L (ref 101–111)
Creatinine, Ser: 0.84 mg/dL (ref 0.44–1.00)
GFR calc Af Amer: >60 mL/min (ref >60)
GFR calc non Af Amer: 59 mL/min — ABNORMAL LOW (ref >60)
Glucose, Bld: 99 mg/dL (ref 65–99)
Potassium: 3.1 mmol/L — ABNORMAL LOW (ref 3.5–5.1)
Sodium: 137 mmol/L (ref 135–145)
Total Bilirubin: 0.8 mg/dL (ref 0.3–1.2)
Total Protein: 4.4 g/dL — ABNORMAL LOW (ref 6.5–8.1)

## 2016-07-23 LAB — CBC
HCT: 30.1 % — ABNORMAL LOW (ref 36.0–46.0)
Hemoglobin: 9.7 g/dL — ABNORMAL LOW (ref 12.0–15.0)
MCH: 24.7 pg — ABNORMAL LOW (ref 26.0–34.0)
MCHC: 32.2 g/dL (ref 30.0–36.0)
MCV: 76.6 fL — ABNORMAL LOW (ref 78.0–100.0)
Platelets: 139 10*3/uL — ABNORMAL LOW (ref 150–400)
RBC: 3.93 MIL/uL (ref 3.87–5.11)
RDW: 15.1 % (ref 11.5–15.5)
WBC: 5.6 10*3/uL (ref 4.0–10.5)

## 2016-07-23 LAB — GLUCOSE, CAPILLARY
Glucose-Capillary: 101 mg/dL — ABNORMAL HIGH (ref 65–99)
Glucose-Capillary: 101 mg/dL — ABNORMAL HIGH (ref 65–99)
Glucose-Capillary: 103 mg/dL — ABNORMAL HIGH (ref 65–99)
Glucose-Capillary: 72 mg/dL (ref 65–99)

## 2016-07-23 LAB — C DIFFICILE QUICK SCREEN W PCR REFLEX
C Diff antigen: NEGATIVE
C Diff interpretation: NOT DETECTED
C Diff toxin: NEGATIVE

## 2016-07-23 MED ORDER — POTASSIUM CHLORIDE 10 MEQ/100ML IV SOLN
10.0000 meq | INTRAVENOUS | Status: AC
Start: 2016-07-23 — End: 2016-07-23
  Administered 2016-07-23 (×3): 10 meq via INTRAVENOUS
  Filled 2016-07-23 (×3): qty 100

## 2016-07-23 MED ORDER — POTASSIUM CHLORIDE 20 MEQ/15ML (10%) PO SOLN
40.0000 meq | Freq: Every day | ORAL | Status: DC
  Filled 2016-07-23: qty 30

## 2016-07-23 NOTE — Progress Notes (Signed)
I have reviewed this visit and discussed with Howell Rucks, RN, BSN, and agree with her documentation.   [name], MD

## 2016-07-23 NOTE — Progress Notes (Signed)
[facility] Gastroenterology Progress Note  AKANE BURDICK 82 y.o. October 08, 1923  C/C : f/up for duodenal bulb mass   Subjective:  Patient complaining of diarrhea with bowel movement after meals ,started last night. Tolerating soft diet without any nausea or vomiting. Denied abdominal pain.  ROS : Denied abdominal pain, nausea, vomiting. Having diarrhea  Objective: Vital signs in last 24 hours: Vitals:   07/22/16 2121 07/23/16 0618  BP: 139/70 138/70  Pulse: 63 60  Resp: 15 16  Temp: 98.8 F (37.1 C) 97.8 F (36.6 C)    Physical Exam:  General:  Alert, cooperative, Elderly not in acute distress   Head:  Normocephalic, without obvious abnormality, atraumatic  Eyes:  , EOM's intact,   Lungs:   Clear to auscultation bilaterally, respirations unlabored  Heart:  Regular rate and rhythm,   Abdomen:   Soft, non-tender, bowel sounds active all four quadrants,  no masses,   Extremities: Extremities normal, atraumatic, no  edema       Lab Results:  Recent Labs  07/21/16 0629 07/22/16 0450 07/23/16 0405  NA 138 140 137  K 3.5 4.0 3.1*  CL 111 110 109  CO2 24 24 23   GLUCOSE 75 68 99  BUN 10 8 6   CREATININE 0.83 0.87 0.84  CALCIUM 8.0* 8.2* 8.2*  MG 1.8  --   --     Recent Labs  07/22/16 0450 07/23/16 0405  AST 19 16  ALT 14 14  ALKPHOS 37* 38  BILITOT 0.8 0.8  PROT 4.4* 4.4*  ALBUMIN 2.4* 2.4*    Recent Labs  07/22/16 0450 07/23/16 0405  WBC 4.9 5.6  HGB 9.6* 9.7*  HCT 30.1* 30.1*  MCV 76.6* 76.6*  PLT 141* 139*   No results for input(s): LABPROT, INR in the last 72 hours.    Assessment/Plan: - Duodenal bulb mass causing outlet obstruction. Status post EGD 09/15. Biopsy negative. Status post EUS - Biopsy showed duodenitis. Negative for Malignancy . Also Negative for H Pylori.  - Anemia. Stable  - HTN    Recommendations -------------------------- - Patient tolerating soft diet. Registered dietitian notes reviewed. Recommended Boost supplements - Stool  for C. difficile for diarrhea - Not sure of utility of a duodenal stent in benign lesion - May be able to get discharge soon  if continues to tolerate diet and diarrhea resolves.  - GI will follow.  Otis Brace MD. [name]  07/23/2016, 9:31 AM  Pager [phone]  If no answer or after 5 PM call [phone]

## 2016-07-23 NOTE — Progress Notes (Signed)
Physical Therapy Treatment Patient Details Name: Linda Macias MRN: [id number] DOB: 17-May-1923 Today's Date: 07/23/2016    History of Present Illness Pt is a 82 y.o. female admitted with abdominal pain. Pt undergoing esophageal endoscopic ultrasound on 07/21/16. PMH: HTN, CVA, cancer, Rt THA.     PT Comments    Pt making progress with mobility during PT sessions, able to ambulate 150 ft with rollator and min guard assist. PT to continue to follow to progress mobility in anticipation of D/C to home with family support.   Follow Up Recommendations  Home health PT;Supervision for mobility/OOB     Equipment Recommendations  None recommended by PT    Recommendations for Other Services       Precautions / Restrictions Precautions Precautions: Fall Restrictions Weight Bearing Restrictions: No    Mobility  Bed Mobility               General bed mobility comments: sitting in chair upon arrival  Transfers Overall transfer level: Needs assistance Equipment used: None Transfers: Sit to/from Stand Sit to Stand: Min guard         General transfer comment: Good use of hands, cues for posture.   Ambulation/Gait Ambulation/Gait assistance: Min guard Ambulation Distance (Feet): 150 Feet Assistive device: 4-wheeled walker Gait Pattern/deviations: Step-through pattern;Trunk flexed;Decreased step length - right;Decreased step length - left Gait velocity: decreased   General Gait Details: cues for posture as needed.    Stairs            Wheelchair Mobility    Modified Rankin (Stroke Patients Only)       Balance Overall balance assessment: Needs assistance Sitting-balance support: No upper extremity supported Sitting balance-Leahy Scale: Good     Standing balance support: During functional activity Standing balance-Leahy Scale: Fair Standing balance comment: using rollator for ambulation                    Cognition Arousal/Alertness:  Awake/alert Behavior During Therapy: WFL for tasks assessed/performed Overall Cognitive Status: Within Functional Limits for tasks assessed                      Exercises      General Comments        Pertinent Vitals/Pain Pain Assessment: No/denies pain    Home Living                      Prior Function            PT Goals (current goals can now be found in the care plan section) Acute Rehab PT Goals Patient Stated Goal: get this figured out.   PT Goal Formulation: With patient/family Time For Goal Achievement: 08/04/16 Potential to Achieve Goals: Good Progress towards PT goals: Progressing toward goals    Frequency    Min 3X/week      PT Plan Current plan remains appropriate    Co-evaluation             End of Session Equipment Utilized During Treatment: Gait belt Activity Tolerance: Patient tolerated treatment well Patient left: in chair;with call bell/phone within reach     Time: 1008-1029 PT Time Calculation (min) (ACUTE ONLY): 21 min  Charges:  $Gait Training: 8-22 mins                    G Codes:      Cassell Clement, PT, CSCS Pager [phone] Office [phone]  [phone]  07/23/2016, 1:18 PM

## 2016-07-23 NOTE — Progress Notes (Signed)
Family Medicine Teaching Service Daily Progress Note Intern Pager: [phone]  Patient name: Linda Macias Medical record number: [id number] Date of birth: [birth year] Age: 82 y.o. Gender: female  Primary Care Provider: Lockie Pares, MD Consultants: GI Code Status: DNR  Pt Overview and Major Events to Date:  9/13 admitted with 10m of nausea and NBNB emesis 9/14 GI consulted 9/15 endoscopy with duodenal mass biopsy sent for analysis 9/18 path without malignancy > duodenitis 9/20 EGD with US performed, repeat biopsies taken  Assessment and Plan: Linda Macias is a 82 y.o. female presenting with abdominal distention, nausea/vomiting, constipation progressively worsening for 2 months. PMH is significant for small intestinal bacterial overgrowth, chronic diarrhea, HTN (stable off meds), LV Diastolic Dysfunction, IBS, hx of TIA.   Diarrhea: Pt with history of bacterial growth. 6 stools 9/21.  -C diff pending  Gastric Outlet Obstruction: Duodenal mass biopsy found duodenitis.  Patient has declined surgery at this point but is open to having a stent placed. - advanced diet to soft 9/20 - Continue following CMP - GI recommendations appreciated: attempt to eat and maintain nutrition, will consider additional  -soft diet ordered, pt has not tried to eat any solids yet -repeat path pending  HTN: Not on medications at home. Currently hypertensive 150-178/76-94. - will continue to monitor  Hypokalemia, hypomagnesemia: K stable between 3.8-4. Mag most recently 1.7, repleted. Rechecked Mg > 1.8. Mag and K repleted 9/20. - will monitor   Microcytic Anemia: hgb 9 on admit. Baseline ~8. No signs of bleeding. Last Colonoscopy in 07/2014 with two polyps with pathology of tubular adenoma and no high grade dysplasia. Tranfused 1 unit PRBC 9/18. Hgb stable at 9.6 post transfusion. - monitor CBC  Left Ventricular Diastolic Dysfunction: ECHO 08/2015 with EF 55-60%, pseudonormal LV filling pattern  with G2DD. Trace pitting edema up to knees. Lungs are clear to auscultation. No dyspnea or orthopnea.  - will monitor  History of Small intestinal bacterial overgrowth: was diagnosed with this about 1 year ago and had associated weight loss. Was on Xifaxan but currently off this. Her chronic diarrhea and weight improved despite being off of Xifaxan. Was seen by GI a few weeks ago, and granddaughter reports patient was doing well per GI at that time. Currently having soft stools, no diarrhea.   Placement: Patient lives at home alone, would rather not go to a SNF, but amenable to PT assessment.  -PT eval and treat: HHPT with supervision for mobility   FEN/GI: soft diet Prophylaxis: Lovenox  Disposition: continued inpatient management   Subjective:  Pt feels well today, tolerating soft diet well. Ready to go home today despite diarrhea.   Objective: Temp:  [97.8 F (36.6 C)-98.8 F (37.1 C)] 97.8 F (36.6 C) (09/22 0618) Pulse Rate:  [60-112] 60 (09/22 0618) Resp:  [14-16] 16 (09/22 0618) BP: (119-139)/(70-89) 138/70 (09/22 0618) SpO2:  [100 %] 100 % (09/22 0618) Physical Exam: GEN: NAD, thin elderly female in no acute distress CV: Regular rhythm, no murmurs PULM: CTAB, normal effort ABD: Soft, mild tenderness to palpation in upper quadrants b/l, decreased BS present EXTR: trace edema to knees bilaterally   Laboratory:  Recent Labs Lab 07/21/16 0629 07/22/16 0450 07/23/16 0405  WBC 5.3 4.9 5.6  HGB 9.6* 9.6* 9.7*  HCT 30.1* 30.1* 30.1*  PLT 125* 141* 139*    Recent Labs Lab 07/21/16 0629 07/22/16 0450 07/23/16 0405  NA 138 140 137  K 3.5 4.0 3.1*  CL 111 110 109  CO2  24 24 23   BUN 10 8 6   CREATININE 0.83 0.87 0.84  CALCIUM 8.0* 8.2* 8.2*  PROT 4.2* 4.4* 4.4*  BILITOT 1.0 0.8 0.8  ALKPHOS 34* 37* 38  ALT 13* 14 14  AST 17 19 16   GLUCOSE 75 68 99    Ralene Ok, MD [place], PGY-1 [place] Intern pager: [phone], text pages  welcome 07/23/2016, 9:53 AM

## 2016-07-24 ENCOUNTER — Inpatient Hospital Stay (HOSPITAL_COMMUNITY): Payer: Medicare Other

## 2016-07-24 LAB — BASIC METABOLIC PANEL
Anion gap: 3 — ABNORMAL LOW (ref 5–15)
BUN: 6 mg/dL (ref 6–20)
CO2: 20 mmol/L — ABNORMAL LOW (ref 22–32)
Calcium: 8.6 mg/dL — ABNORMAL LOW (ref 8.9–10.3)
Chloride: 116 mmol/L — ABNORMAL HIGH (ref 101–111)
Creatinine, Ser: 0.79 mg/dL (ref 0.44–1.00)
GFR calc Af Amer: >60 mL/min (ref >60)
GFR calc non Af Amer: >60 mL/min (ref >60)
Glucose, Bld: 80 mg/dL (ref 65–99)
Potassium: 3.1 mmol/L — ABNORMAL LOW (ref 3.5–5.1)
Sodium: 139 mmol/L (ref 135–145)

## 2016-07-24 LAB — CBC
HCT: 31.4 % — ABNORMAL LOW (ref 36.0–46.0)
Hemoglobin: 10.1 g/dL — ABNORMAL LOW (ref 12.0–15.0)
MCH: 24.8 pg — ABNORMAL LOW (ref 26.0–34.0)
MCHC: 32.2 g/dL (ref 30.0–36.0)
MCV: 77 fL — ABNORMAL LOW (ref 78.0–100.0)
Platelets: 145 10*3/uL — ABNORMAL LOW (ref 150–400)
RBC: 4.08 MIL/uL (ref 3.87–5.11)
RDW: 15.3 % (ref 11.5–15.5)
WBC: 6.2 10*3/uL (ref 4.0–10.5)

## 2016-07-24 LAB — GLUCOSE, CAPILLARY
Glucose-Capillary: 106 mg/dL — ABNORMAL HIGH (ref 65–99)
Glucose-Capillary: 107 mg/dL — ABNORMAL HIGH (ref 65–99)
Glucose-Capillary: 70 mg/dL (ref 65–99)
Glucose-Capillary: 99 mg/dL (ref 65–99)

## 2016-07-24 MED ORDER — POTASSIUM CHLORIDE CRYS ER 20 MEQ PO TBCR
40.0000 meq | EXTENDED_RELEASE_TABLET | Freq: Two times a day (BID) | ORAL | Status: AC
  Administered 2016-07-24 (×2): 40 meq via ORAL
  Filled 2016-07-24 (×2): qty 2

## 2016-07-24 MED ORDER — POTASSIUM CHLORIDE 20 MEQ PO PACK
40.0000 meq | PACK | Freq: Two times a day (BID) | ORAL | Status: DC
  Filled 2016-07-24: qty 2

## 2016-07-24 MED ORDER — POTASSIUM CHLORIDE 10 MEQ/100ML IV SOLN
10.0000 meq | INTRAVENOUS | Status: DC
Start: 2016-07-24 — End: 2016-07-24

## 2016-07-24 NOTE — Progress Notes (Signed)
Family Medicine Teaching Service Daily Progress Note Intern Pager: [phone]  Patient name: Linda Macias Medical record number: [id number] Date of birth: 04/21/1923 Age: 82 y.o. Gender: female  Primary Care Provider: Lockie Pares, MD Consultants: GI Code Status: DNR  Pt Overview and Major Events to Date:  9/13 admitted with 14m of nausea and NBNB emesis 9/14 GI consulted 9/15 endoscopy with duodenal mass biopsy sent for analysis 9/18 path without malignancy > duodenitis 9/20 EGD with US performed, repeat biopsies taken 9/22 pt with diarrhea x 6 stools, C diff negative, abdominal XRs consistent with ileus and food in stomach  Assessment and Plan: Linda Macias is a 82 y.o. female presenting with abdominal distention, nausea/vomiting, constipation progressively worsening for 2 months. PMH is significant for small intestinal bacterial overgrowth, chronic diarrhea, HTN (stable off meds), LV Diastolic Dysfunction, IBS, hx of TIA.   Diarrhea: Pt with history of bacterial growth. 6 stools 9/21, 7 stool 9/22. Denies abdominal pain or vomiting. Reports stooling 10 minutes after eating. Is walking with PT. C diff negative. Abd XR consistent with ileus and food in stomach. Concerned that duodenal inflammation is inhibiting food passage and prolonging ileus. -appreciate GI recs  Gastric Outlet Obstruction: Duodenal mass biopsy found duodenitis.  Patient has declined surgery at this point but is open to having a stent placed. Repeat EGD found duodenitis with narrowing of lumen, repeat path consistent with duodenitis. GI not inclined to place stent at this time as increased risk of migration of stent with nonmalignant lesion. - advanced diet to soft 9/20 - Continue following CMP - GI recommendations appreciated: attempt to eat and maintain nutrition, will consider additional evaluation if unable to tolerate PO feeds  HTN: Not on medications at home. Currently hypertensive 150-178/76-94. - will  continue to monitor  Hypokalemia, hypomagnesemia: Used Kdur at home. Need to replete K almost daily, had to use runs of IV K due to taste of liquid K, will consider micro K on discharge. K 3.1.  -replete K  Microcytic Anemia: hgb 9 on admit. Baseline ~8. No signs of bleeding. Last Colonoscopy in 07/2014 with two polyps with pathology of tubular adenoma and no high grade dysplasia. Tranfused 1 unit PRBC 9/18. Hgb stable at 9.6>10.1 post transfusion. - monitor CBC  Left Ventricular Diastolic Dysfunction: ECHO 08/2015 with EF 55-60%, pseudonormal LV filling pattern with G2DD. Trace pitting edema up to knees. Lungs are clear to auscultation. No dyspnea or orthopnea.  - will monitor  History of Small intestinal bacterial overgrowth: was diagnosed with this about 1 year ago and had associated weight loss. Was on Xifaxan but currently off this. Her chronic diarrhea and weight improved despite being off of Xifaxan. Was seen by GI a few weeks ago, and granddaughter reports patient was doing well per GI at that time.   Placement: Patient lives at home alone, would rather not go to a SNF, but amenable to PT assessment.  -PT eval and treat: HHPT with supervision for mobility   FEN/GI: soft diet Prophylaxis: Lovenox  Disposition: continued inpatient management of diarrhea  Subjective:  Pt feels well today, eating soft diet when I visited with her. Pleased with her care here, denies any pain or vomiting. Endorses persistent diarrhea.  Objective: Temp:  [97.5 F (36.4 C)-97.8 F (36.6 C)] 97.6 F (36.4 C) (09/23 0603) Pulse Rate:  [62-78] 78 (09/23 0603) Resp:  [16-17] 17 (09/23 0603) BP: (118-178)/(65-86) 118/72 (09/23 0603) SpO2:  [100 %] 100 % (09/23 0603) Physical Exam: GEN:  NAD, thin elderly female in no acute distress CV: Regular rhythm, no murmurs PULM: CTAB, normal effort ABD: Soft, mild tenderness to palpation in upper quadrants b/l, decreased BS present EXTR: trace edema to knees  bilaterally   Laboratory:  Recent Labs Lab 07/22/16 0450 07/23/16 0405 07/24/16 0500  WBC 4.9 5.6 6.2  HGB 9.6* 9.7* 10.1*  HCT 30.1* 30.1* 31.4*  PLT 141* 139* 145*    Recent Labs Lab 07/21/16 0629 07/22/16 0450 07/23/16 0405 07/24/16 0500  NA 138 140 137 139  K 3.5 4.0 3.1* 3.1*  CL 111 110 109 116*  CO2 24 24 23  20*  BUN 10 8 6 6   CREATININE 0.83 0.87 0.84 0.79  CALCIUM 8.0* 8.2* 8.2* 8.6*  PROT 4.2* 4.4* 4.4*  --   BILITOT 1.0 0.8 0.8  --   ALKPHOS 34* 37* 38  --   ALT 13* 14 14  --   AST 17 19 16   --   GLUCOSE 75 68 99 80    Ralene Ok, MD [place], PGY-1 [place] Intern pager: [phone], text pages welcome 07/24/2016, 10:15 AM

## 2016-07-25 LAB — CBC
HCT: 29.3 % — ABNORMAL LOW (ref 36.0–46.0)
Hemoglobin: 9.3 g/dL — ABNORMAL LOW (ref 12.0–15.0)
MCH: 24.5 pg — ABNORMAL LOW (ref 26.0–34.0)
MCHC: 31.7 g/dL (ref 30.0–36.0)
MCV: 77.3 fL — ABNORMAL LOW (ref 78.0–100.0)
Platelets: 136 10*3/uL — ABNORMAL LOW (ref 150–400)
RBC: 3.79 MIL/uL — ABNORMAL LOW (ref 3.87–5.11)
RDW: 15.6 % — ABNORMAL HIGH (ref 11.5–15.5)
WBC: 6.4 10*3/uL (ref 4.0–10.5)

## 2016-07-25 LAB — BASIC METABOLIC PANEL
Anion gap: 4 — ABNORMAL LOW (ref 5–15)
BUN: 8 mg/dL (ref 6–20)
CO2: 18 mmol/L — ABNORMAL LOW (ref 22–32)
Calcium: 8.4 mg/dL — ABNORMAL LOW (ref 8.9–10.3)
Chloride: 120 mmol/L — ABNORMAL HIGH (ref 101–111)
Creatinine, Ser: 0.8 mg/dL (ref 0.44–1.00)
GFR calc Af Amer: >60 mL/min (ref >60)
GFR calc non Af Amer: >60 mL/min (ref >60)
Glucose, Bld: 83 mg/dL (ref 65–99)
Potassium: 2.9 mmol/L — ABNORMAL LOW (ref 3.5–5.1)
Sodium: 142 mmol/L (ref 135–145)

## 2016-07-25 LAB — GLUCOSE, CAPILLARY
Glucose-Capillary: 136 mg/dL — ABNORMAL HIGH (ref 65–99)
Glucose-Capillary: 80 mg/dL (ref 65–99)
Glucose-Capillary: 88 mg/dL (ref 65–99)

## 2016-07-25 MED ORDER — POTASSIUM CHLORIDE CRYS ER 20 MEQ PO TBCR: 40.0000 meq | EXTENDED_RELEASE_TABLET | Freq: Two times a day (BID) | ORAL | 3 refills | Status: AC

## 2016-07-25 MED ORDER — POTASSIUM CHLORIDE CRYS ER 20 MEQ PO TBCR
40.0000 meq | EXTENDED_RELEASE_TABLET | Freq: Two times a day (BID) | ORAL | Status: DC
  Administered 2016-07-25: 40 meq via ORAL
  Filled 2016-07-25: qty 2

## 2016-07-25 MED ORDER — POTASSIUM CHLORIDE CRYS ER 20 MEQ PO TBCR: 40.0000 meq | EXTENDED_RELEASE_TABLET | Freq: Two times a day (BID) | ORAL | Status: DC

## 2016-07-25 NOTE — Progress Notes (Signed)
[facility] Gastroenterology Progress Note  Linda Macias 82 y.o. 1923/04/06   Subjective: Feels ok. Denies abdominal pain. Tolerating diet but does not like taste of food.  Objective: Vital signs in last 24 hours: Vitals:   07/24/16 2057 07/25/16 0648  BP: (!) 165/71 (!) 144/64  Pulse: 64 91  Resp: 16 16  Temp: 98 F (36.7 C) 98.2 F (36.8 C)    Physical Exam: Gen: alert, no acute distress, elderly, thin, pleasant CV: RRR Chest: CTA B Abd: soft, nontender, nondistended, +BS  Lab Results:  Recent Labs  07/24/16 0500 07/25/16 0654  NA 139 142  K 3.1* 2.9*  CL 116* 120*  CO2 20* 18*  GLUCOSE 80 83  BUN 6 8  CREATININE 0.79 0.80  CALCIUM 8.6* 8.4*    Recent Labs  07/23/16 0405  AST 16  ALT 14  ALKPHOS 38  BILITOT 0.8  PROT 4.4*  ALBUMIN 2.4*    Recent Labs  07/24/16 0500 07/25/16 0654  WBC 6.2 6.4  HGB 10.1* 9.3*  HCT 31.4* 29.3*  MCV 77.0* 77.3*  PLT 145* 136*   No results for input(s): LABPROT, INR in the last 72 hours.    Assessment/Plan: Gastric outlet obstruction with negative biopsies on EUS and EGD of duodenal lesion. Continue soft diet. Conservative management. Will sign off. Call if questions. F/U with Dr. Alessandra Bevels as needed.   [place] C. 07/25/2016, 2:04 PM  Pager [phone]  If no answer or after 5 PM call [phone] ID: Linda Macias, female   DOB: Mar 27, 1923, 82 y.o.   MRN: [id number]

## 2016-07-25 NOTE — Progress Notes (Signed)
CM received call from RN requesting arrangement for HHPT (per PT recc).  CM notes previous CM has offered choice and has made referral to [facility].  CM called RN and has requested face to face and HHPT order.  CM spoke with Lujean Rave [phone] to discuss DME needs and Lujean Rave has requested 3n1.  CM notified [facility] DME rep, Reggie to please deliver the 3n1 to room so pt and daughter Lujean Rave

## 2016-07-25 NOTE — Progress Notes (Signed)
Family Medicine Teaching Service Daily Progress Note Intern Pager: [phone]  Patient name: Linda Macias Medical record number: [id number] Date of birth: December 23, 1922 Age: 82 y.o. Gender: female  Primary Care Provider: Lockie Pares, MD Consultants: GI Code Status: DNR  Pt Overview and Major Events to Date:  9/13 admitted with 80m of nausea and NBNB emesis 9/14 GI consulted 9/15 endoscopy with duodenal mass biopsy sent for analysis 9/18 path without malignancy > duodenitis 9/20 EGD with US performed, repeat biopsies taken 9/22 pt with diarrhea x 6 stools, C diff negative, abdominal XRs consistent with ileus and food in stomach 9/23 abdominal xray showed colonic ileus w/o obstruction or free air  Assessment and Plan: [name] is a 82 y.o. female presenting with abdominal distention, nausea/vomiting, constipation progressively worsening for 2 months. PMH is significant for small intestinal bacterial overgrowth, chronic diarrhea, HTN (stable off meds), LV Diastolic Dysfunction, IBS, hx of TIA.   Diarrhea: Pt with history of bacterial growth. 6 stools 9/21, 7 stool 9/22. Denies abdominal pain or vomiting. Reports stooling 10 minutes after eating. Is walking with PT. C diff negative. Abd XR consistent with ileus and food in stomach. Concerned that duodenal inflammation is inhibiting food passage and prolonging ileus. 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  Patient has declined surgery at this point but is open to having a stent placed. Repeat EGD found duodenitis with narrowing of lumen, repeat path consistent with duodenitis. GI not inclined to place stent at this time as increased risk of migration of stent with nonmalignant lesion. - Advanced diet to soft 9/20, tolerating well, passed x1 stool early 9/24 AM - Continue following BMP - GI recommendations appreciated: attempt to eat and maintain nutrition, will consider additional  evaluation if unable to tolerate PO feeds  HTN: Not on medications at home. BP 144/64. - Will continue to monitor  Hypokalemia, hypomagnesemia: Used Kdur at home. Need to replete K almost daily, had to use runs of IV K due to taste of liquid K, will consider micro K on discharge. K 2.9.  - Replete K with K-dur 40 mEq BID  Microcytic Anemia: hgb 9 on admit. Baseline ~8. No signs of bleeding. Last Colonoscopy in 07/2014 with two polyps with pathology of tubular adenoma and no high grade dysplasia. Tranfused 1 unit PRBC 9/18. Hgb stable at 9.6>10.1 post transfusion. - Monitor CBC  Left Ventricular Diastolic Dysfunction: ECHO 08/2015 with EF 55-60%, pseudonormal LV filling pattern with G2DD. Trace pitting edema up to knees. Lungs are clear to auscultation. No dyspnea or orthopnea.  - Will monitor  Histoory of Small intestinal bacterial overgrwth: was diagnosed with this about 1 year ago and had associated weight loss. Was on Xifaxan but currently off this. Her chronic diarrhea and weight improved despite being off of Xifaxan. Was seen by GI a few weeks ago, and granddaughter reports patient was doing well per GI at that time.   Placement: Patient lives at home alone, would rather not go to a SNF, but amenable to PT assessment.  - PT eval and treat: HHPT with supervision for mobility   FEN/GI: soft diet Prophylaxis: Lovenox  Disposition: continued inpatient management of diarrhea  Subjective:  Afebrile and stable overnight. Pt feels well this morning, eating most of her soft diet last night. Passed x1 bowel movement around 0400 this AM.  Objective: Temp:  [97.6 F (36.4 C)-98.2 F (36.8 C)] 98.2 F (36.8 C) (09/24 [id number]) Pulse Rate:  [  64-91] 91 (09/24 0648) Resp:  [16] 16 (09/24 0648) BP: (136-165)/(64-98) 144/64 (09/24 0648) SpO2:  [99 %-100 %] 99 % (09/24 [id number]) Physical Exam: GEN: NAD, thin elderly female in no acute distress CV: Regular rhythm, no murmurs PULM: CTAB, normal  effort ABD: Soft, mild tenderness to palpation in upper quadrants b/l, decreased BS present EXTR: trace edema to knees bilaterally  Laboratory:  Recent Labs Lab 07/23/16 0405 07/24/16 0500 07/25/16 0654  WBC 5.6 6.2 6.4  HGB 9.7* 10.1* 9.3*  HCT 30.1* 31.4* 29.3*  PLT 139* 145* 136*    Recent Labs Lab 07/21/16 0629 07/22/16 0450 07/23/16 0405 07/24/16 0500 07/25/16 0654  NA 138 140 137 139 142  K 3.5 4.0 3.1* 3.1* 2.9*  CL 111 110 109 116* 120*  CO2 24 24 23  20* 18*  BUN 10 8 6 6 8   CREATININE 0.83 0.87 0.84 0.79 0.80  CALCIUM 8.0* 8.2* 8.2* 8.6* 8.4*  PROT 4.2* 4.4* 4.4*  --   --   BILITOT 1.0 0.8 0.8  --   --   ALKPHOS 34* 37* 38  --   --   ALT 13* 14 14  --   --   AST 17 19 16   --   --   GLUCOSE 75 68 99 80 83   DG Abd 2 Views: 07/24/2016 FINDINGS: Supine and upright images were obtained. The stomach is filled with food material. There is air throughout the colon with mild colonic dilatation in the transverse region. No bowel obstruction is evident. No free air. Postoperative total hip replacement on the right noted. There is mild bibasilar lung atelectasis. Visualized lung bases otherwise clear.  IMPRESSION: Findings indicative of a degree of colonic ileus. No bowel obstruction evident. Stomach distended with food material. No free air evident.    Groveland Bing, DO [place], PGY-1 [place] Intern pager: [phone], text pages welcome 07/25/2016, 8:08 AM

## 2016-07-25 NOTE — Discharge Instructions (Signed)
Repeat BMP to check K and continue k-dur 40 mg BID for 3 days and then restart home dose 20 mg BID

## 2016-07-27 DIAGNOSIS — R197 Diarrhea, unspecified: Secondary | ICD-10-CM | POA: Diagnosis not present

## 2016-07-27 DIAGNOSIS — Z8673 Personal history of transient ischemic attack (TIA), and cerebral infarction without residual deficits: Secondary | ICD-10-CM | POA: Diagnosis not present

## 2016-07-27 DIAGNOSIS — Z48815 Encounter for surgical aftercare following surgery on the digestive system: Secondary | ICD-10-CM | POA: Diagnosis not present

## 2016-07-27 DIAGNOSIS — M1991 Primary osteoarthritis, unspecified site: Secondary | ICD-10-CM | POA: Diagnosis not present

## 2016-07-27 DIAGNOSIS — I509 Heart failure, unspecified: Secondary | ICD-10-CM | POA: Diagnosis not present

## 2016-07-27 DIAGNOSIS — J45909 Unspecified asthma, uncomplicated: Secondary | ICD-10-CM | POA: Diagnosis not present

## 2016-07-27 DIAGNOSIS — E785 Hyperlipidemia, unspecified: Secondary | ICD-10-CM | POA: Diagnosis not present

## 2016-07-27 DIAGNOSIS — E119 Type 2 diabetes mellitus without complications: Secondary | ICD-10-CM | POA: Diagnosis not present

## 2016-07-27 DIAGNOSIS — C9000 Multiple myeloma not having achieved remission: Secondary | ICD-10-CM | POA: Diagnosis not present

## 2016-07-27 DIAGNOSIS — K6389 Other specified diseases of intestine: Secondary | ICD-10-CM | POA: Diagnosis not present

## 2016-07-27 DIAGNOSIS — I110 Hypertensive heart disease with heart failure: Secondary | ICD-10-CM | POA: Diagnosis not present

## 2016-07-28 ENCOUNTER — Inpatient Hospital Stay: Payer: Medicare Other | Admitting: Family Medicine

## 2016-07-28 NOTE — Progress Notes (Deleted)
   Subjective:   Patient ID: Linda Macias    DOB: [birth year], 82 y.o. female   MRN: [id number]  CC: ***  HPI: Linda Macias is a 82 y.o. female who presents to clinic today ***. Problems discussed today are as follows:  ROS: See HPI for pertinent ROS.  [place]: Pertinent past medical, surgical, family, and social history were reviewed and updated as appropriate. Smoking status reviewed.  Medications reviewed. Current Outpatient Prescriptions  Medication Sig Dispense Refill  . diclofenac sodium (VOLTAREN) 1 % GEL Apply 2 g topically 4 (four) times daily. 100 g 0  . mometasone (NASONEX) 50 MCG/ACT nasal spray Place 2 sprays into the nose daily. 17 g 12  . potassium chloride SA (K-DUR,KLOR-CON) 20 MEQ tablet Take 2 tablets (40 mEq total) by mouth 2 (two) times daily. 60 tablet 3   No current facility-administered medications for this visit.     Objective:   There were no vitals taken for this visit. Vitals and nursing note reviewed.  General: well nourished, well developed, in no acute distress with non-toxic appearance HEENT: normocephalic, atraumatic, moist mucous membranes Neck: supple, non-tender without lymphadenopathy CV: regular rate and rhythm without murmurs rubs or gallops Lungs: clear to auscultation bilaterally with normal work of breathing Abdomen: soft, non-tender, no masses or organomegaly palpable, normoactive bowel sounds Skin: warm, dry, no rashes or lesions, cap refill < 2 seconds Extremities: warm and well perfused, normal tone  Assessment & Plan:   No problem-specific Assessment & Plan notes found for this encounter.  No orders of the defined types were placed in this encounter.  No orders of the defined types were placed in this encounter.   Lovenia Kim, MD [place], PGY-1 07/28/2016 3:07 PM

## 2016-07-29 ENCOUNTER — Telehealth: Payer: Self-pay | Admitting: Internal Medicine

## 2016-07-29 NOTE — Telephone Encounter (Signed)
Dr. Emmaline Life will be back from vacation on 08/02/2016.  Tks!

## 2016-07-29 NOTE — Telephone Encounter (Signed)
inquiring about FMLA paperwork that was turned in a couple weeks ago. Please call [phone]. Thanks! ep

## 2016-08-03 DIAGNOSIS — I509 Heart failure, unspecified: Secondary | ICD-10-CM | POA: Diagnosis not present

## 2016-08-03 DIAGNOSIS — I110 Hypertensive heart disease with heart failure: Secondary | ICD-10-CM | POA: Diagnosis not present

## 2016-08-03 DIAGNOSIS — Z8673 Personal history of transient ischemic attack (TIA), and cerebral infarction without residual deficits: Secondary | ICD-10-CM | POA: Diagnosis not present

## 2016-08-03 DIAGNOSIS — E119 Type 2 diabetes mellitus without complications: Secondary | ICD-10-CM | POA: Diagnosis not present

## 2016-08-03 DIAGNOSIS — M1991 Primary osteoarthritis, unspecified site: Secondary | ICD-10-CM | POA: Diagnosis not present

## 2016-08-03 DIAGNOSIS — E785 Hyperlipidemia, unspecified: Secondary | ICD-10-CM | POA: Diagnosis not present

## 2016-08-03 DIAGNOSIS — C9000 Multiple myeloma not having achieved remission: Secondary | ICD-10-CM | POA: Diagnosis not present

## 2016-08-03 DIAGNOSIS — J45909 Unspecified asthma, uncomplicated: Secondary | ICD-10-CM | POA: Diagnosis not present

## 2016-08-03 DIAGNOSIS — Z48815 Encounter for surgical aftercare following surgery on the digestive system: Secondary | ICD-10-CM | POA: Diagnosis not present

## 2016-08-03 NOTE — Telephone Encounter (Signed)
Called daughter to let her know that I will fill out the FMLA form this Friday.

## 2016-08-04 ENCOUNTER — Telehealth: Payer: Self-pay | Admitting: *Deleted

## 2016-08-04 NOTE — Telephone Encounter (Signed)
Linda Macias, Physical Theripist with [facility] called stating patient has told her she would like to be discharge from all home health services.  Patient feels she does not need their services anymore.  Please call with questions at [phone].  Derl Barrow, RN

## 2016-08-06 ENCOUNTER — Other Ambulatory Visit: Payer: Self-pay | Admitting: Internal Medicine

## 2016-08-25 ENCOUNTER — Other Ambulatory Visit: Payer: Self-pay | Admitting: Gastroenterology

## 2016-08-25 DIAGNOSIS — R634 Abnormal weight loss: Secondary | ICD-10-CM

## 2016-08-25 DIAGNOSIS — K311 Adult hypertrophic pyloric stenosis: Secondary | ICD-10-CM

## 2016-08-25 DIAGNOSIS — R198 Other specified symptoms and signs involving the digestive system and abdomen: Secondary | ICD-10-CM

## 2016-08-25 DIAGNOSIS — R933 Abnormal findings on diagnostic imaging of other parts of digestive tract: Secondary | ICD-10-CM | POA: Diagnosis not present

## 2016-09-01 ENCOUNTER — Other Ambulatory Visit: Payer: Medicare Other

## 2016-09-07 ENCOUNTER — Telehealth: Payer: Self-pay | Admitting: Internal Medicine

## 2016-09-07 DIAGNOSIS — K311 Adult hypertrophic pyloric stenosis: Secondary | ICD-10-CM

## 2016-09-07 DIAGNOSIS — K3189 Other diseases of stomach and duodenum: Secondary | ICD-10-CM

## 2016-09-07 NOTE — Telephone Encounter (Signed)
Daughter called and would like to speak to one of the nurse for Dr. Emmaline Life. She has some question because her mother does not want to be brought back to the hospital and she know that's we have a DNR sheet here, Please call the daughter to discuss this. jw

## 2016-09-07 NOTE — Telephone Encounter (Signed)
I spoke with Linda Macias (Dgt of pt).  She is requesting transportable DNR form and a referral to Hospice for comfort care of her mother. I am aware of Linda Macias's case from her admission to [facility] in September.  I reviewed the patient's [facility] record including her discharge summary from 07/14/16 - 924/17 admission to [facility]. Pt is sleeping more than 20 hours a day.  Her oral intake is minimal. She does not appear to be in discomfort.  Having diarrhea.   A/ Possible End of Life secondary to obstructing duodenal mass of uncertain origin.       Family Desires only Comfort Care goals of therapy.      (+) DNR status per dgt.   P/  Portable DNR form signed for dgt to pick up at [facility] front desk.   Referral to Hospice and Power.  [facility] physicians will remain the primary providers for Linda Macias's care in Hospice.  Will consult with Hospice physician's as needed for symptom management.

## 2016-09-08 ENCOUNTER — Inpatient Hospital Stay: Admission: RE | Admit: 2016-09-08 | Payer: Medicare Other | Source: Ambulatory Visit

## 2016-10-06 ENCOUNTER — Telehealth: Payer: Self-pay

## 2016-10-06 NOTE — Telephone Encounter (Signed)
Spoke with Everitt Amber. At [place] phone # [phone] ext [phone], fax # [phone]. Told her the orders they needed signed were not written by Dr Ardis Hughs so she asked me to fax them back so they will have that on record.

## 2016-10-12 ENCOUNTER — Telehealth: Payer: Self-pay | Admitting: Internal Medicine

## 2016-10-12 NOTE — Telephone Encounter (Signed)
Pt passed away October 14, 2016. Please advise. Thanks! ep

## 2016-10-12 NOTE — Telephone Encounter (Signed)
FYI to MD

## 2016-11-01 DEATH — deceased

## 2016-11-25 ENCOUNTER — Telehealth: Payer: Self-pay | Admitting: Gastroenterology

## 2016-11-25 NOTE — Telephone Encounter (Signed)
Left Ria Comment a message to call me back.

## 2016-11-26 NOTE — Telephone Encounter (Signed)
Left a message to call me back

## 2016-12-01 NOTE — Telephone Encounter (Signed)
Left another message to call me back.
